# Patient Record
Sex: Female | Born: 1945 | Race: White | Hispanic: No | Marital: Married | Smoking: Never smoker
Health system: Southern US, Community
[De-identification: ages and names within clinical notes are randomized; demographics above are authoritative.]

## PROBLEM LIST (undated history)

## (undated) DIAGNOSIS — I10 Essential (primary) hypertension: Secondary | ICD-10-CM

## (undated) DIAGNOSIS — F329 Major depressive disorder, single episode, unspecified: Secondary | ICD-10-CM

## (undated) DIAGNOSIS — M1990 Unspecified osteoarthritis, unspecified site: Secondary | ICD-10-CM

## (undated) DIAGNOSIS — G709 Myoneural disorder, unspecified: Secondary | ICD-10-CM

## (undated) DIAGNOSIS — M5430 Sciatica, unspecified side: Secondary | ICD-10-CM

## (undated) DIAGNOSIS — IMO0002 Reserved for concepts with insufficient information to code with codable children: Secondary | ICD-10-CM

## (undated) DIAGNOSIS — F32A Depression, unspecified: Secondary | ICD-10-CM

## (undated) DIAGNOSIS — E785 Hyperlipidemia, unspecified: Secondary | ICD-10-CM

## (undated) DIAGNOSIS — M719 Bursopathy, unspecified: Secondary | ICD-10-CM

## (undated) DIAGNOSIS — T7840XA Allergy, unspecified, initial encounter: Secondary | ICD-10-CM

## (undated) HISTORY — DX: Essential (primary) hypertension: I10

## (undated) HISTORY — DX: Depression, unspecified: F32.A

## (undated) HISTORY — DX: Hyperlipidemia, unspecified: E78.5

## (undated) HISTORY — DX: Major depressive disorder, single episode, unspecified: F32.9

## (undated) HISTORY — DX: Unspecified osteoarthritis, unspecified site: M19.90

## (undated) HISTORY — DX: Bursopathy, unspecified: M71.9

## (undated) HISTORY — DX: Reserved for concepts with insufficient information to code with codable children: IMO0002

## (undated) HISTORY — PX: ENDOMETRIAL BIOPSY: SHX622

## (undated) HISTORY — PX: BIOPSY BREAST: PRO8

## (undated) HISTORY — PX: BREAST BIOPSY: SHX20

## (undated) HISTORY — PX: CYSTECTOMY: SUR359

## (undated) HISTORY — DX: Allergy, unspecified, initial encounter: T78.40XA

## (undated) HISTORY — PX: TONSILLECTOMY: SHX5217

## (undated) HISTORY — DX: Sciatica, unspecified side: M54.30

---

## 1992-02-11 HISTORY — PX: LEEP: SHX91

## 1997-09-11 ENCOUNTER — Ambulatory Visit (HOSPITAL_COMMUNITY): Admission: RE | Admit: 1997-09-11 | Discharge: 1997-09-11 | Payer: Self-pay | Admitting: Obstetrics and Gynecology

## 1997-12-20 ENCOUNTER — Other Ambulatory Visit: Admission: RE | Admit: 1997-12-20 | Discharge: 1997-12-20 | Payer: Self-pay | Admitting: Family Medicine

## 1998-03-02 ENCOUNTER — Other Ambulatory Visit: Admission: RE | Admit: 1998-03-02 | Discharge: 1998-03-02 | Payer: Self-pay | Admitting: *Deleted

## 1998-09-14 ENCOUNTER — Ambulatory Visit (HOSPITAL_COMMUNITY): Admission: RE | Admit: 1998-09-14 | Discharge: 1998-09-14 | Payer: Self-pay | Admitting: *Deleted

## 2000-04-07 ENCOUNTER — Other Ambulatory Visit: Admission: RE | Admit: 2000-04-07 | Discharge: 2000-04-07 | Payer: Self-pay | Admitting: Family Medicine

## 2000-04-15 ENCOUNTER — Encounter: Payer: Self-pay | Admitting: Family Medicine

## 2000-04-15 ENCOUNTER — Ambulatory Visit (HOSPITAL_COMMUNITY): Admission: RE | Admit: 2000-04-15 | Discharge: 2000-04-15 | Payer: Self-pay | Admitting: Family Medicine

## 2002-02-23 ENCOUNTER — Encounter: Payer: Self-pay | Admitting: Family Medicine

## 2002-02-23 ENCOUNTER — Ambulatory Visit (HOSPITAL_COMMUNITY): Admission: RE | Admit: 2002-02-23 | Discharge: 2002-02-23 | Payer: Self-pay | Admitting: Family Medicine

## 2002-07-25 ENCOUNTER — Encounter: Admission: RE | Admit: 2002-07-25 | Discharge: 2002-07-25 | Payer: Self-pay | Admitting: Orthopedic Surgery

## 2002-07-25 ENCOUNTER — Encounter: Payer: Self-pay | Admitting: Orthopedic Surgery

## 2002-08-01 ENCOUNTER — Encounter: Admission: RE | Admit: 2002-08-01 | Discharge: 2002-08-01 | Payer: Self-pay | Admitting: Infectious Diseases

## 2002-08-02 ENCOUNTER — Ambulatory Visit (HOSPITAL_BASED_OUTPATIENT_CLINIC_OR_DEPARTMENT_OTHER): Admission: RE | Admit: 2002-08-02 | Discharge: 2002-08-02 | Payer: Self-pay | Admitting: Orthopedic Surgery

## 2002-08-12 ENCOUNTER — Encounter: Admission: RE | Admit: 2002-08-12 | Discharge: 2002-08-12 | Payer: Self-pay | Admitting: Infectious Diseases

## 2002-08-26 ENCOUNTER — Encounter: Admission: RE | Admit: 2002-08-26 | Discharge: 2002-08-26 | Payer: Self-pay | Admitting: Infectious Diseases

## 2002-09-16 ENCOUNTER — Ambulatory Visit (HOSPITAL_COMMUNITY): Admission: RE | Admit: 2002-09-16 | Discharge: 2002-09-16 | Payer: Self-pay | Admitting: Infectious Diseases

## 2002-09-16 ENCOUNTER — Encounter: Payer: Self-pay | Admitting: Infectious Diseases

## 2002-09-23 ENCOUNTER — Encounter: Admission: RE | Admit: 2002-09-23 | Discharge: 2002-09-23 | Payer: Self-pay | Admitting: Infectious Diseases

## 2004-07-09 ENCOUNTER — Ambulatory Visit: Payer: Self-pay | Admitting: Family Medicine

## 2004-07-23 ENCOUNTER — Ambulatory Visit: Payer: Self-pay | Admitting: Family Medicine

## 2004-07-23 ENCOUNTER — Other Ambulatory Visit: Admission: RE | Admit: 2004-07-23 | Discharge: 2004-07-23 | Payer: Self-pay | Admitting: Family Medicine

## 2004-07-24 ENCOUNTER — Ambulatory Visit: Payer: Self-pay | Admitting: Family Medicine

## 2004-07-29 ENCOUNTER — Ambulatory Visit: Payer: Self-pay | Admitting: Internal Medicine

## 2004-08-01 ENCOUNTER — Ambulatory Visit (HOSPITAL_COMMUNITY): Admission: RE | Admit: 2004-08-01 | Discharge: 2004-08-01 | Payer: Self-pay | Admitting: Family Medicine

## 2004-08-31 ENCOUNTER — Ambulatory Visit: Payer: Self-pay | Admitting: Family Medicine

## 2004-11-05 ENCOUNTER — Encounter (INDEPENDENT_AMBULATORY_CARE_PROVIDER_SITE_OTHER): Payer: Self-pay | Admitting: *Deleted

## 2004-11-05 ENCOUNTER — Ambulatory Visit: Payer: Self-pay | Admitting: Family Medicine

## 2004-11-07 ENCOUNTER — Ambulatory Visit: Payer: Self-pay | Admitting: Family Medicine

## 2005-12-15 ENCOUNTER — Ambulatory Visit: Payer: Self-pay | Admitting: Family Medicine

## 2005-12-15 ENCOUNTER — Other Ambulatory Visit: Admission: RE | Admit: 2005-12-15 | Discharge: 2005-12-15 | Payer: Self-pay | Admitting: Family Medicine

## 2005-12-15 ENCOUNTER — Encounter: Payer: Self-pay | Admitting: Family Medicine

## 2005-12-15 LAB — CONVERTED CEMR LAB
AST: 24 units/L (ref 0–37)
Albumin: 4.4 g/dL (ref 3.5–5.2)
Alkaline Phosphatase: 57 units/L (ref 39–117)
BUN: 22 mg/dL (ref 6–23)
Basophils Relative: 0.1 % (ref 0.0–1.0)
Calcium: 9.4 mg/dL (ref 8.4–10.5)
Creatinine, Ser: 1 mg/dL (ref 0.4–1.2)
Eosinophil percent: 0.6 % (ref 0.0–5.0)
Glucose, Bld: 104 mg/dL — ABNORMAL HIGH (ref 70–99)
HCT: 46.7 % — ABNORMAL HIGH (ref 36.0–46.0)
Hgb A1c MFr Bld: 6.1 % — ABNORMAL HIGH (ref 4.6–6.0)
Lymphocytes Relative: 24 % (ref 12.0–46.0)
Neutro Abs: 4.5 10*3/uL (ref 1.4–7.7)
Sodium: 142 meq/L (ref 135–145)
TSH: 3.98 microintl units/mL (ref 0.35–5.50)
Total Bilirubin: 0.8 mg/dL (ref 0.3–1.2)
Total Protein: 7.4 g/dL (ref 6.0–8.3)

## 2005-12-30 ENCOUNTER — Ambulatory Visit: Payer: Self-pay | Admitting: Family Medicine

## 2006-02-17 ENCOUNTER — Ambulatory Visit (HOSPITAL_COMMUNITY): Admission: RE | Admit: 2006-02-17 | Discharge: 2006-02-17 | Payer: Self-pay | Admitting: Family Medicine

## 2006-07-09 ENCOUNTER — Ambulatory Visit: Payer: Self-pay | Admitting: Family Medicine

## 2006-07-10 DIAGNOSIS — M76899 Other specified enthesopathies of unspecified lower limb, excluding foot: Secondary | ICD-10-CM

## 2006-07-10 DIAGNOSIS — IMO0002 Reserved for concepts with insufficient information to code with codable children: Secondary | ICD-10-CM

## 2006-07-10 DIAGNOSIS — F320 Major depressive disorder, single episode, mild: Secondary | ICD-10-CM

## 2006-07-10 DIAGNOSIS — I10 Essential (primary) hypertension: Secondary | ICD-10-CM

## 2006-07-10 DIAGNOSIS — F329 Major depressive disorder, single episode, unspecified: Secondary | ICD-10-CM

## 2006-07-10 HISTORY — DX: Major depressive disorder, single episode, mild: F32.0

## 2006-07-10 HISTORY — DX: Reserved for concepts with insufficient information to code with codable children: IMO0002

## 2006-07-10 HISTORY — DX: Other specified enthesopathies of unspecified lower limb, excluding foot: M76.899

## 2006-07-10 HISTORY — DX: Essential (primary) hypertension: I10

## 2006-07-17 ENCOUNTER — Encounter: Payer: Self-pay | Admitting: Family Medicine

## 2006-07-27 ENCOUNTER — Ambulatory Visit: Payer: Self-pay | Admitting: Family Medicine

## 2006-07-30 ENCOUNTER — Encounter (INDEPENDENT_AMBULATORY_CARE_PROVIDER_SITE_OTHER): Payer: Self-pay | Admitting: *Deleted

## 2006-07-30 LAB — CONVERTED CEMR LAB
ALT: 24 units/L (ref 0–40)
Alkaline Phosphatase: 52 units/L (ref 39–117)
BUN: 24 mg/dL — ABNORMAL HIGH (ref 6–23)
Bilirubin, Direct: 0.1 mg/dL (ref 0.0–0.3)
CO2: 27 meq/L (ref 19–32)
Chloride: 109 meq/L (ref 96–112)
Creatinine, Ser: 0.7 mg/dL (ref 0.4–1.2)
GFR calc non Af Amer: 90 mL/min
Potassium: 4.2 meq/L (ref 3.5–5.1)
Total Bilirubin: 0.5 mg/dL (ref 0.3–1.2)

## 2006-08-03 ENCOUNTER — Encounter: Payer: Self-pay | Admitting: Family Medicine

## 2006-08-05 ENCOUNTER — Telehealth: Payer: Self-pay | Admitting: Family Medicine

## 2006-11-16 ENCOUNTER — Encounter: Payer: Self-pay | Admitting: Family Medicine

## 2006-11-16 ENCOUNTER — Telehealth (INDEPENDENT_AMBULATORY_CARE_PROVIDER_SITE_OTHER): Payer: Self-pay | Admitting: *Deleted

## 2006-11-27 ENCOUNTER — Ambulatory Visit: Payer: Self-pay | Admitting: Family Medicine

## 2006-11-30 ENCOUNTER — Encounter: Payer: Self-pay | Admitting: Family Medicine

## 2006-12-09 ENCOUNTER — Encounter: Payer: Self-pay | Admitting: Family Medicine

## 2006-12-29 ENCOUNTER — Telehealth (INDEPENDENT_AMBULATORY_CARE_PROVIDER_SITE_OTHER): Payer: Self-pay | Admitting: *Deleted

## 2006-12-29 DIAGNOSIS — N951 Menopausal and female climacteric states: Secondary | ICD-10-CM

## 2006-12-29 DIAGNOSIS — I739 Peripheral vascular disease, unspecified: Secondary | ICD-10-CM

## 2006-12-29 HISTORY — DX: Menopausal and female climacteric states: N95.1

## 2006-12-29 HISTORY — DX: Peripheral vascular disease, unspecified: I73.9

## 2007-01-15 ENCOUNTER — Encounter: Payer: Self-pay | Admitting: Family Medicine

## 2007-01-15 ENCOUNTER — Ambulatory Visit: Payer: Self-pay | Admitting: Internal Medicine

## 2007-01-18 ENCOUNTER — Ambulatory Visit: Payer: Self-pay

## 2007-01-20 ENCOUNTER — Encounter: Payer: Self-pay | Admitting: Family Medicine

## 2007-01-21 ENCOUNTER — Encounter: Payer: Self-pay | Admitting: Family Medicine

## 2007-02-15 ENCOUNTER — Encounter (INDEPENDENT_AMBULATORY_CARE_PROVIDER_SITE_OTHER): Payer: Self-pay | Admitting: *Deleted

## 2007-03-23 ENCOUNTER — Ambulatory Visit: Payer: Self-pay | Admitting: Family Medicine

## 2007-03-24 ENCOUNTER — Encounter (INDEPENDENT_AMBULATORY_CARE_PROVIDER_SITE_OTHER): Payer: Self-pay | Admitting: *Deleted

## 2007-04-16 ENCOUNTER — Ambulatory Visit (HOSPITAL_COMMUNITY): Admission: RE | Admit: 2007-04-16 | Discharge: 2007-04-16 | Payer: Self-pay | Admitting: Family Medicine

## 2007-04-26 ENCOUNTER — Encounter (INDEPENDENT_AMBULATORY_CARE_PROVIDER_SITE_OTHER): Payer: Self-pay | Admitting: *Deleted

## 2007-05-18 ENCOUNTER — Telehealth (INDEPENDENT_AMBULATORY_CARE_PROVIDER_SITE_OTHER): Payer: Self-pay | Admitting: *Deleted

## 2007-05-27 ENCOUNTER — Telehealth (INDEPENDENT_AMBULATORY_CARE_PROVIDER_SITE_OTHER): Payer: Self-pay | Admitting: *Deleted

## 2007-06-03 ENCOUNTER — Ambulatory Visit: Payer: Self-pay | Admitting: Family Medicine

## 2007-06-03 DIAGNOSIS — M1990 Unspecified osteoarthritis, unspecified site: Secondary | ICD-10-CM

## 2007-06-03 DIAGNOSIS — E785 Hyperlipidemia, unspecified: Secondary | ICD-10-CM

## 2007-06-03 HISTORY — DX: Unspecified osteoarthritis, unspecified site: M19.90

## 2007-06-03 HISTORY — DX: Hyperlipidemia, unspecified: E78.5

## 2007-08-02 ENCOUNTER — Encounter: Admission: RE | Admit: 2007-08-02 | Discharge: 2007-08-02 | Payer: Self-pay | Admitting: Family Medicine

## 2007-09-23 ENCOUNTER — Telehealth (INDEPENDENT_AMBULATORY_CARE_PROVIDER_SITE_OTHER): Payer: Self-pay | Admitting: *Deleted

## 2007-10-13 ENCOUNTER — Encounter: Payer: Self-pay | Admitting: Family Medicine

## 2007-10-13 ENCOUNTER — Other Ambulatory Visit: Admission: RE | Admit: 2007-10-13 | Discharge: 2007-10-13 | Payer: Self-pay | Admitting: Family Medicine

## 2007-10-13 ENCOUNTER — Ambulatory Visit: Payer: Self-pay | Admitting: Family Medicine

## 2007-10-19 ENCOUNTER — Encounter (INDEPENDENT_AMBULATORY_CARE_PROVIDER_SITE_OTHER): Payer: Self-pay | Admitting: *Deleted

## 2007-10-20 ENCOUNTER — Encounter (INDEPENDENT_AMBULATORY_CARE_PROVIDER_SITE_OTHER): Payer: Self-pay | Admitting: *Deleted

## 2007-10-25 ENCOUNTER — Encounter (INDEPENDENT_AMBULATORY_CARE_PROVIDER_SITE_OTHER): Payer: Self-pay | Admitting: *Deleted

## 2008-05-31 ENCOUNTER — Telehealth: Payer: Self-pay | Admitting: Family Medicine

## 2008-06-02 ENCOUNTER — Telehealth (INDEPENDENT_AMBULATORY_CARE_PROVIDER_SITE_OTHER): Payer: Self-pay | Admitting: *Deleted

## 2008-06-02 ENCOUNTER — Encounter: Payer: Self-pay | Admitting: Family Medicine

## 2008-06-03 ENCOUNTER — Encounter: Payer: Self-pay | Admitting: Family Medicine

## 2008-06-07 LAB — CONVERTED CEMR LAB
ALT: 21 units/L (ref 0–35)
AST: 20 units/L (ref 0–37)
Albumin: 4.4 g/dL (ref 3.5–5.2)
Alkaline Phosphatase: 57 units/L (ref 39–117)
Bilirubin, Direct: 0.1 mg/dL (ref 0.0–0.3)
Indirect Bilirubin: 0.4 mg/dL (ref 0.0–0.9)
Total Bilirubin: 0.5 mg/dL (ref 0.3–1.2)
Total Protein: 7.1 g/dL (ref 6.0–8.3)

## 2008-06-08 ENCOUNTER — Encounter (INDEPENDENT_AMBULATORY_CARE_PROVIDER_SITE_OTHER): Payer: Self-pay | Admitting: *Deleted

## 2008-06-09 ENCOUNTER — Encounter (INDEPENDENT_AMBULATORY_CARE_PROVIDER_SITE_OTHER): Payer: Self-pay | Admitting: *Deleted

## 2008-10-26 ENCOUNTER — Encounter (INDEPENDENT_AMBULATORY_CARE_PROVIDER_SITE_OTHER): Payer: Self-pay | Admitting: *Deleted

## 2008-11-17 ENCOUNTER — Encounter (INDEPENDENT_AMBULATORY_CARE_PROVIDER_SITE_OTHER): Payer: Self-pay | Admitting: *Deleted

## 2008-12-04 ENCOUNTER — Other Ambulatory Visit: Admission: RE | Admit: 2008-12-04 | Discharge: 2008-12-04 | Payer: Self-pay | Admitting: Family Medicine

## 2008-12-04 ENCOUNTER — Ambulatory Visit: Payer: Self-pay | Admitting: Family Medicine

## 2008-12-04 ENCOUNTER — Encounter: Payer: Self-pay | Admitting: Family Medicine

## 2008-12-04 LAB — HM COLONOSCOPY

## 2008-12-18 ENCOUNTER — Encounter (INDEPENDENT_AMBULATORY_CARE_PROVIDER_SITE_OTHER): Payer: Self-pay | Admitting: *Deleted

## 2008-12-22 ENCOUNTER — Encounter: Payer: Self-pay | Admitting: Family Medicine

## 2008-12-25 ENCOUNTER — Encounter: Payer: Self-pay | Admitting: Family Medicine

## 2008-12-25 LAB — CONVERTED CEMR LAB
AST: 21 units/L (ref 0–37)
Albumin: 4.3 g/dL (ref 3.5–5.2)
Alkaline Phosphatase: 45 units/L (ref 39–117)
Basophils Absolute: 0 10*3/uL (ref 0.0–0.1)
Bilirubin Urine: NEGATIVE
Bilirubin, Direct: 0.1 mg/dL (ref 0.0–0.3)
HCT: 45.7 % (ref 36.0–46.0)
Hemoglobin: 15 g/dL (ref 12.0–15.0)
Ketones, ur: NEGATIVE mg/dL
LDL Cholesterol: 62 mg/dL (ref 0–99)
Monocytes Absolute: 0.5 10*3/uL (ref 0.1–1.0)
Monocytes Relative: 6 % (ref 3–12)
Neutro Abs: 5 10*3/uL (ref 1.7–7.7)
Neutrophils Relative %: 62 % (ref 43–77)
Nitrite: NEGATIVE
Platelets: 256 10*3/uL (ref 150–400)
Protein, ur: NEGATIVE mg/dL
RBC / HPF: NONE SEEN (ref <3)
RBC: 4.86 M/uL (ref 3.87–5.11)
Sodium: 144 meq/L (ref 135–145)
Specific Gravity, Urine: 1.016 (ref 1.005–1.030)
TSH: 3.159 microintl units/mL (ref 0.350–4.500)
Total Bilirubin: 0.6 mg/dL (ref 0.3–1.2)
Urine Glucose: NEGATIVE mg/dL
Urobilinogen, UA: 0.2 (ref 0.0–1.0)
VLDL: 14 mg/dL (ref 0–40)
pH: 7.5 (ref 5.0–8.0)

## 2008-12-26 ENCOUNTER — Ambulatory Visit (HOSPITAL_COMMUNITY): Admission: RE | Admit: 2008-12-26 | Discharge: 2008-12-26 | Payer: Self-pay | Admitting: Family Medicine

## 2009-01-08 ENCOUNTER — Encounter: Payer: Self-pay | Admitting: Family Medicine

## 2009-01-11 ENCOUNTER — Encounter: Admission: RE | Admit: 2009-01-11 | Discharge: 2009-01-11 | Payer: Self-pay | Admitting: Family Medicine

## 2009-02-27 ENCOUNTER — Telehealth (INDEPENDENT_AMBULATORY_CARE_PROVIDER_SITE_OTHER): Payer: Self-pay | Admitting: *Deleted

## 2009-02-28 ENCOUNTER — Encounter: Payer: Self-pay | Admitting: Family Medicine

## 2009-02-28 ENCOUNTER — Ambulatory Visit: Payer: Self-pay | Admitting: Family Medicine

## 2009-05-16 ENCOUNTER — Encounter: Payer: Self-pay | Admitting: Family Medicine

## 2009-07-12 ENCOUNTER — Ambulatory Visit: Payer: Self-pay | Admitting: Family Medicine

## 2009-07-17 ENCOUNTER — Encounter: Admission: RE | Admit: 2009-07-17 | Discharge: 2009-07-17 | Payer: Self-pay | Admitting: Family Medicine

## 2009-09-28 ENCOUNTER — Encounter: Payer: Self-pay | Admitting: Family Medicine

## 2009-10-01 LAB — CONVERTED CEMR LAB
CO2: 23 meq/L (ref 19–32)
Calcium: 9.9 mg/dL (ref 8.4–10.5)
Chloride: 108 meq/L (ref 96–112)
Cholesterol: 199 mg/dL (ref 0–200)
Creatinine, Ser: 0.82 mg/dL (ref 0.40–1.20)
Glucose, Bld: 111 mg/dL — ABNORMAL HIGH (ref 70–99)
Potassium: 4.4 meq/L (ref 3.5–5.3)
Sodium: 144 meq/L (ref 135–145)
Total CHOL/HDL Ratio: 2.6
VLDL: 15 mg/dL (ref 0–40)

## 2010-02-17 ENCOUNTER — Encounter: Payer: Self-pay | Admitting: Family Medicine

## 2010-02-19 ENCOUNTER — Encounter
Admission: RE | Admit: 2010-02-19 | Discharge: 2010-02-19 | Payer: Self-pay | Source: Home / Self Care | Attending: Family Medicine | Admitting: Family Medicine

## 2010-02-19 LAB — HM MAMMOGRAPHY

## 2010-02-20 ENCOUNTER — Encounter: Payer: Self-pay | Admitting: Family Medicine

## 2010-02-21 ENCOUNTER — Telehealth (INDEPENDENT_AMBULATORY_CARE_PROVIDER_SITE_OTHER): Payer: Self-pay | Admitting: *Deleted

## 2010-03-10 LAB — CONVERTED CEMR LAB
ALT: 31 units/L (ref 0–35)
AST: 26 units/L (ref 0–37)
Albumin: 4.1 g/dL (ref 3.5–5.2)
Albumin: 4.3 g/dL (ref 3.5–5.2)
Alkaline Phosphatase: 46 units/L (ref 39–117)
Alkaline Phosphatase: 47 units/L (ref 39–117)
Basophils Absolute: 0 10*3/uL (ref 0.0–0.1)
Basophils Relative: 0.4 % (ref 0.0–3.0)
Bilirubin, Direct: 0.1 mg/dL (ref 0.0–0.3)
CO2: 32 meq/L (ref 19–32)
Calcium: 9.6 mg/dL (ref 8.4–10.5)
Creatinine, Ser: 0.8 mg/dL (ref 0.4–1.2)
Eosinophils Relative: 2.5 % (ref 0.0–5.0)
GFR calc Af Amer: 93 mL/min
Glucose, Bld: 101 mg/dL — ABNORMAL HIGH (ref 70–99)
Hemoglobin: 14.2 g/dL (ref 12.0–15.0)
LDL Cholesterol: 58 mg/dL (ref 0–99)
MCV: 95.4 fL (ref 78.0–100.0)
Monocytes Absolute: 0.5 10*3/uL (ref 0.1–1.0)
Monocytes Relative: 6.8 % (ref 3.0–12.0)
Neutro Abs: 3.5 10*3/uL (ref 1.4–7.7)
Platelets: 206 10*3/uL (ref 150–400)
RDW: 12.2 % (ref 11.5–14.6)
Total Bilirubin: 0.8 mg/dL (ref 0.3–1.2)
WBC: 7 10*3/uL (ref 4.5–10.5)

## 2010-03-12 ENCOUNTER — Encounter: Payer: Self-pay | Admitting: Family Medicine

## 2010-03-12 NOTE — Progress Notes (Signed)
Summary: med change  Phone Note Refill Request Message from:  Fax from Pharmacy on [facility] [address] fax [phone]  Refills Requested: Medication #1:  ATACAND HCT 32-12.5 MG  TABS 2 TAB by mouth once daily**OFFICE VISIT DUE NOW** prior authorization [id number]  Initial call taken by: Barb Merino,  February 27, 2009 12:45 PM  Follow-up for Phone Call         ATACAND is not preferred. preferred meds are as follow:Losartan, losartan hct, diovan, diovan HCT, micardis pls advise on med.........Marland KitchenFelecia Deloach CMA  February 27, 2009 4:05 PM   Additional Follow-up for Phone Call Additional follow up Details #1::        diovan 80/12.5  #30 1 by mouth once daily  2 refills---ov 2-3 weeks to check bp Additional Follow-up by: [name] DO,  February 27, 2009 5:00 PM    Additional Follow-up for Phone Call Additional follow up Details #2::    left message to call  office. call in regards to med has been change due to med not being preferred by insurance................Marland KitchenFelecia Deloach CMA  February 28, 2009 8:11 AM   pt aware, rx sent to pharmacy and to schedule OV 2-3 weeks, offer to schedule pt decline will call later............Marland KitchenFelecia Deloach CMA  February 28, 2009 11:23 AM   New/Updated Medications: DIOVAN HCT 80-12.5 MG TABS (VALSARTAN-HYDROCHLOROTHIAZIDE) take 1 by mouth once daily Prescriptions: DIOVAN HCT 80-12.5 MG TABS (VALSARTAN-HYDROCHLOROTHIAZIDE) take 1 by mouth once daily  #30 x 2   Entered by:   Jeremy Johann CMA   Authorized by:   [name] DO   Signed by:   Jeremy Johann CMA on 02/28/2009   Method used:   Electronically to        [facility]  [id number] (retail)       [address]       [place], [place]  [address]       Ph: [phone]       Fax: [phone]   RxID:   [id number]

## 2010-03-12 NOTE — Assessment & Plan Note (Signed)
Summary: bp check/cbs   Vital Signs:  Patient profile:   65 year old female Weight:      170.13 pounds Pulse rate:   76 / minute Pulse rhythm:   regular BP sitting:   114 / 72  (left arm) Cuff size:   regular  Vitals Entered By: Army Fossa CMA (July 12, 2009 12:39 PM) CC: Pt here for BP check   History of Present Illness:  Hypertension follow-up      This is a 65 year old woman who presents for Hypertension follow-up.  pt here for bp check since med was changed.   No complaints.  The patient denies lightheadedness, urinary frequency, headaches, edema, impotence, rash, and fatigue.  The patient denies the following associated symptoms: chest pain, chest pressure, exercise intolerance, dyspnea, palpitations, syncope, leg edema, and pedal edema.  Compliance with medications (by patient report) has been near 100%.  The patient reports that dietary compliance has been good.  Adjunctive measures currently used by the patient include salt restriction.    Allergies: No Known Drug Allergies  Physical Exam  General:  Well-developed,well-nourished,in no acute distress; alert,appropriate and cooperative throughout examination Lungs:  Normal respiratory effort, chest expands symmetrically. Lungs are clear to auscultation, no crackles or wheezes. Heart:  normal rate and no murmur.   Extremities:  No clubbing, cyanosis, edema, or deformity noted with normal full range of motion of all joints.   Psych:  Oriented X3 and normally interactive.     Impression & Recommendations:  Problem # 1:  HYPERTENSION (ICD-401.9)  Her updated medication list for this problem includes:    Diovan Hct 80-12.5 Mg Tabs (Valsartan-hydrochlorothiazide) .[name]... Take 1 by mouth once daily  Orders: Venipuncture (40814) TLB-Lipid Panel (80061-LIPID) TLB-BMP (Basic Metabolic Panel-BMET) (80048-METABOL) TLB-Hepatic/Liver Function Pnl (80076-HEPATIC)  BP today: 114/72 Prior BP: 110/64 (12/04/2008)  Labs  Reviewed: K+: 4.8 (12/22/2008) Creat: : 0.82 (12/22/2008)   Chol: 134 (12/22/2008)   HDL: 58 (12/22/2008)   LDL: 62 (12/22/2008)   TG: 72 (12/22/2008)  Complete Medication List: 1)  Vytorin 10-40 Mg Tabs (Ezetimibe-simvastatin) .[name].. 1 by mouth once daily 2)  Zoloft 100 Mg Tabs (Sertraline hcl) .[name].. 1 and 1/2 tab by mouth once daily**office visit due now** 3)  Diovan Hct 80-12.5 Mg Tabs (Valsartan-hydrochlorothiazide) .... Take 1 by mouth once daily 4)  Niaspan 1000 Mg Tbcr (Niacin (antihyperlipidemic)) .... # 2, 1 by mouth at bedtime 5)  Fluticasone Propionate 50 Mcg/act Susp (Fluticasone propionate) .... Use 2 sprays each nostril daily 6)  Mobic 15 Mg Tabs (Meloxicam) .... 1/2 - 1 by mouth once daily prn 7)  Lipid, Hep  .... Dx 272.4 8)  Finasteride 5 Mg Tabs (Finasteride) .... 1/2 tab daily for hair loss.  Patient Instructions: 1)  Please schedule a follow-up appointment in 6 months .  Prescriptions: DIOVAN HCT 80-12.5 MG TABS (VALSARTAN-HYDROCHLOROTHIAZIDE) take 1 by mouth once daily  #30 x 5   Entered and Authorized by:   [name] DO   Signed by:   [name] DO on 07/12/2009   Method used:   Electronically to        [facility]  [id number]* (retail)       [address]       [place], [place]  [address]       Ph: [phone]       Fax: [phone]   RxID:   [id number]

## 2010-03-12 NOTE — Letter (Signed)
Summary: [facility]  [facility]   Imported By: Lanelle Bal 06/13/2009 08:41:31  _____________________________________________________________________  External Attachment:    Type:   Image     Comment:   External Document

## 2010-03-12 NOTE — Miscellaneous (Signed)
Summary: BONE DENSITY  Clinical Lists Changes  Orders: Added new Test order of T-Bone Densitometry (77080) - Signed Added new Test order of T-Lumbar Vertebral Assessment (77082) - Signed 

## 2010-03-13 ENCOUNTER — Other Ambulatory Visit: Payer: Self-pay | Admitting: Family Medicine

## 2010-03-13 ENCOUNTER — Ambulatory Visit (INDEPENDENT_AMBULATORY_CARE_PROVIDER_SITE_OTHER): Payer: 59 | Admitting: Family Medicine

## 2010-03-13 ENCOUNTER — Ambulatory Visit: Admit: 2010-03-13 | Payer: Self-pay | Admitting: Family Medicine

## 2010-03-13 ENCOUNTER — Encounter (INDEPENDENT_AMBULATORY_CARE_PROVIDER_SITE_OTHER): Payer: Self-pay | Admitting: *Deleted

## 2010-03-13 ENCOUNTER — Encounter: Payer: Self-pay | Admitting: Family Medicine

## 2010-03-13 DIAGNOSIS — Z23 Encounter for immunization: Secondary | ICD-10-CM

## 2010-03-13 DIAGNOSIS — E785 Hyperlipidemia, unspecified: Secondary | ICD-10-CM

## 2010-03-13 DIAGNOSIS — Z860100 Personal history of colon polyps, unspecified: Secondary | ICD-10-CM

## 2010-03-13 DIAGNOSIS — I10 Essential (primary) hypertension: Secondary | ICD-10-CM

## 2010-03-13 DIAGNOSIS — N951 Menopausal and female climacteric states: Secondary | ICD-10-CM

## 2010-03-13 DIAGNOSIS — Z0000 Encounter for general adult medical examination without abnormal findings: Secondary | ICD-10-CM

## 2010-03-13 HISTORY — DX: Personal history of colon polyps, unspecified: Z86.0100

## 2010-03-13 HISTORY — DX: Personal history of colonic polyps: Z86.010

## 2010-03-13 LAB — HEPATIC FUNCTION PANEL
ALT: 41 U/L — ABNORMAL HIGH (ref 0–35)
AST: 33 U/L (ref 0–37)
Alkaline Phosphatase: 62 U/L (ref 39–117)
Total Bilirubin: 0.4 mg/dL (ref 0.3–1.2)
Total Protein: 7.1 g/dL (ref 6.0–8.3)

## 2010-03-13 LAB — BASIC METABOLIC PANEL
CO2: 29 mEq/L (ref 19–32)
Calcium: 9.5 mg/dL (ref 8.4–10.5)
Creatinine, Ser: 0.7 mg/dL (ref 0.4–1.2)

## 2010-03-14 NOTE — Letter (Signed)
Summary: Primary Care Appointment Letter  [facility]  [address] [place], [place] [address]   Phone: [phone]  Fax: [phone]    02/20/2010 MRN: [id number]    Glenda Lyons [address] [place], [place]  [address]    Dear [name],     Your Primary Care Physician [name] DO has indicated that:    ___X____it is time to schedule an appointment for an Office Visit with fasting labs.    _______you missed your appointment on______ and need to call and          reschedule.    _______you need to have lab work done.    _______you need to schedule an appointment discuss lab or test results.    _______you need to call to reschedule your appointment that is                       scheduled on _________.     Please call our office as soon as possible. Our phone number is [phone]          _________. Please press option 1. Our office is open 8a-12noon and 1p-5p, Monday through Friday.     Thank you,    [facility]

## 2010-03-14 NOTE — Progress Notes (Signed)
Summary: Patient needs and OV  Phone Note Outgoing Call   Call placed by: Almeta Monas CMA Duncan Dull),  February 21, 2010 11:11 AM Call placed to: Patient Details for Reason: Patient wants labs but she needs to have an OV per Dr. Laury Axon Summary of Call: called patient home # Left message to call back... Almeta Monas CMA Duncan Dull)  February 21, 2010 11:12 AM'    Follow-up for Phone Call        Spoke with patient and scheduled appointment for 03/13/2010 (Wed) at 10. Per patient needs a 10am or 11am appointment Follow-up by: Shonna Chock CMA,  February 21, 2010 4:12 PM

## 2010-03-14 NOTE — Letter (Signed)
Summary: Letter from Patient Regarding Lab Order  Letter from Patient Regarding Lab Order   Imported By: Lanelle Bal 03/05/2010 13:05:51  _____________________________________________________________________  External Attachment:    Type:   Image     Comment:   External Document

## 2010-03-20 NOTE — Assessment & Plan Note (Signed)
Summary: F/U   Vital Signs:  Patient profile:   65 year old female Height:      65 inches Weight:      173.4 pounds BMI:     28.96 Pulse rate:   76 / minute Pulse rhythm:   regular BP sitting:   122 / 82  (left arm) Cuff size:   large  Vitals Entered By: Almeta Monas CMA Duncan Dull) (March 13, 2010 10:03 AM) CC: Med F/U---Pt Fasting--needs labs(recheck 3 months---bmp, hgba1c  790.6  272.4  hep, lipid) They were due in Nov   History of Present Illness:  Hypertension follow-up      This is a 65 year old woman who presents for Hypertension follow-up.  The patient denies lightheadedness, urinary frequency, headaches, edema, impotence, rash, and fatigue.  The patient denies the following associated symptoms: chest pain, chest pressure, exercise intolerance, dyspnea, palpitations, syncope, leg edema, and pedal edema.  Compliance with medications (by patient report) has been near 100%.  The patient reports that dietary compliance has been good.  The patient reports exercising 3-4X per week.  Adjunctive measures currently used by the patient include salt restriction.    Hyperlipidemia follow-up      The patient also presents for Hyperlipidemia follow-up.  The patient denies muscle aches, GI upset, abdominal pain, flushing, itching, constipation, diarrhea, and fatigue.  The patient denies the following symptoms: chest pain/pressure, exercise intolerance, dypsnea, palpitations, syncope, and pedal edema.  Compliance with medications (by patient report) has been near 100%.  Dietary compliance has been good.  The patient reports exercising 3-4X per week.  Adjunctive measures currently used by the patient include ASA, fish oil supplements, and weight reduction.    Current Medications (verified): 1)  Vytorin 10-40 Mg  Tabs (Ezetimibe-Simvastatin) .[name].. 1 By Mouth Once Daily 2)  Zoloft 100 Mg  Tabs (Sertraline Hcl) .[name].. 1 and 1/2 Tab By Mouth Once Daily**office Visit Due Now** 3)  Diovan Hct 80-12.5 Mg  Tabs (Valsartan-Hydrochlorothiazide) .... Take 1 By Mouth Once Daily 4)  Niaspan 1000 Mg  Tbcr (Niacin (Antihyperlipidemic)) .[name].. 1 By Mouth At Bedtime 5)  Fluticasone Propionate 50 Mcg/act  Susp (Fluticasone Propionate) .... Use 2 Sprays Each Nostril Daily 6)  Mobic 15 Mg  Tabs (Meloxicam) .... 1/2 - 1 By Mouth Once Daily Prn 7)  Lipid, Hep .... Dx 272.4 8)  Finasteride 5 Mg Tabs (Finasteride) .... 1/2 Tab Daily For Hair Loss.  Allergies (verified): No Known Drug Allergies  Past History:  Past medical, surgical, family and social histories (including risk factors) reviewed for relevance to current acute and chronic problems.  Past Medical History: Reviewed history from 10/13/2007 and no changes required. 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 Tonsillectomy  Family History: Reviewed history from 07/10/2006 and no changes required. Family History Breast Cancer Family History MI Family History lymphoma  Social History: Reviewed history from 10/13/2007 and no changes required. Married Never Smoked Alcohol use-yes Drug use-no Regular exercise-yes  Review of Systems      See HPI  Physical Exam  General:  Well-developed,well-nourished,in no acute distress; alert,appropriate and cooperative throughout examination Neck:  No deformities, masses, or tenderness noted. Lungs:  Normal respiratory effort, chest expands symmetrically. Lungs are clear to auscultation, no crackles or wheezes. Heart:  normal rate and  no murmur.   Extremities:  No clubbing, cyanosis, edema, or deformity noted with normal full range of  motion of all joints.   Cervical Nodes:  No lymphadenopathy noted Psych:  Cognition and judgment appear intact. Alert and cooperative with normal attention span and concentration. No apparent delusions, illusions, hallucinations   Impression & Recommendations:  Problem # 1:  HYPERLIPIDEMIA (ICD-272.4)  Her updated medication list for this problem includes:    Vytorin 10-40 Mg Tabs (Ezetimibe-simvastatin) .[name]... 1 by mouth once daily    Niaspan 1000 Mg Tbcr (Niacin (antihyperlipidemic)) .[name]... 1 by mouth at bedtime  Orders: Venipuncture (81191) TLB-Lipid Panel (80061-LIPID) TLB-BMP (Basic Metabolic Panel-BMET) (80048-METABOL) TLB-Hepatic/Liver Function Pnl (80076-HEPATIC)  Labs Reviewed: SGOT: 24 (09/28/2009)   SGPT: 26 (09/28/2009)   HDL:78 (09/28/2009), 58 (12/22/2008)  LDL:106 (09/28/2009), 62 (47/82/9562)  Chol:199 (09/28/2009), 134 (12/22/2008)  Trig:75 (09/28/2009), 72 (12/22/2008)  Problem # 2:  COLONIC POLYPS, HX OF (ICD-V12.72)  Orders: Gastroenterology Referral (GI)  Problem # 3:  HYPERTENSION (ICD-401.9)  Her updated medication list for this problem includes:    Diovan Hct 80-12.5 Mg Tabs (Valsartan-hydrochlorothiazide) .[name]... Take 1 by mouth once daily  Orders: Venipuncture (13086) TLB-Lipid Panel (80061-LIPID) TLB-BMP (Basic Metabolic Panel-BMET) (80048-METABOL) TLB-Hepatic/Liver Function Pnl (80076-HEPATIC)  BP today: 122/82 Prior BP: 114/72 (07/12/2009)  Labs Reviewed: K+: 4.4 (09/28/2009) Creat: : 0.82 (09/28/2009)   Chol: 199 (09/28/2009)   HDL: 78 (09/28/2009)   LDL: 106 (09/28/2009)   TG: 75 (09/28/2009)  Complete Medication List: 1)  Vytorin 10-40 Mg Tabs (Ezetimibe-simvastatin) .[name].. 1 by mouth once daily 2)  Zoloft 100 Mg Tabs (Sertraline hcl) .[name].. 1 and 1/2 tab by mouth once daily**office visit due now** 3)  Diovan Hct 80-12.5 Mg Tabs (Valsartan-hydrochlorothiazide) .... Take 1 by mouth once daily 4)  Niaspan 1000 Mg Tbcr (Niacin  (antihyperlipidemic)) .[name].. 1 by mouth at bedtime 5)  Fluticasone Propionate 50 Mcg/act Susp (Fluticasone propionate) .... Use 2 sprays each nostril daily 6)  Mobic 15 Mg Tabs (Meloxicam) .... 1/2 - 1 by mouth once daily prn 7)  Lipid, Hep  .... Dx 272.4 8)  Finasteride 5 Mg Tabs (Finasteride) .... 1/2 tab daily for hair loss.  Other Orders: Tdap => 20yrs IM (57846) Admin 1st Vaccine (96295)  Patient Instructions: 1)  Rto 6 months for cpe and labs Prescriptions: DIOVAN HCT 80-12.5 MG TABS (VALSARTAN-HYDROCHLOROTHIAZIDE) take 1 by mouth once daily  #30 x 5   Entered and Authorized by:   [name] DO   Signed by:   [name] DO on 03/13/2010   Method used:   Electronically to        [facility]  [id number] (retail)       [address]       [place], [place]  [address]       Ph: [phone]       Fax: [phone]   RxID:   [id number]    Orders Added: 1)  Tdap => 27yrs IM [90715] 2)  Admin 1st Vaccine [90471] 3)  Venipuncture [18841] 4)  TLB-Lipid Panel [80061-LIPID] 5)  TLB-BMP (Basic Metabolic Panel-BMET) [80048-METABOL] 6)  TLB-Hepatic/Liver Function Pnl [80076-HEPATIC] 7)  Gastroenterology Referral [GI] 8)  Est. Patient Level IV [66063]   Immunizations Administered:  Tetanus Vaccine:    Vaccine Type: Tdap    Site: left deltoid    Mfr: Merck    Dose: 0.5 ml    Route: IM    Given by: Almeta Monas CMA (AAMA)    Exp. Date:  11/30/2011    Lot #: [id number]    VIS given: 12/29/07 version given March 13, 2010.   Immunizations Administered:  Tetanus Vaccine:    Vaccine Type: Tdap    Site: left deltoid    Mfr: Merck    Dose: 0.5 ml    Route: IM    Given by: Almeta Monas CMA (AAMA)    Exp. Date: 11/30/2011    Lot #: [id number]    VIS given: 12/29/07 version given March 13, 2010.   Last Mammogram:  ASSESSMENT: Negative - BI-RADS 1^MM DIGITAL SCREENING (02/19/2010 2:05:00 PM) Mammogram Result Date:  02/19/2010 Mammogram Result:  normal Mammogram  Next Due:  1 yr  Appended Document: F/U   Appended Document: Immunization Entry      Immunizations Administered:  Pneumonia Vaccine:    Vaccine Type: Pneumovax (Medicare)    Site: right deltoid    Mfr: Merck    Dose: 0.5 ml    Route: IM    Given by: Almeta Monas CMA (AAMA)    Exp. Date: 06/12/2011    Lot #: [id number]    VIS given: 01/15/09 version given March 13, 2010.

## 2010-03-20 NOTE — Letter (Signed)
Summary: Pre Visit Letter Revised  [facility]  [address] [place], [place] [address]   Phone: [phone]  Fax: [phone]        03/13/2010 MRN: [id number] Glenda Lyons [address] [place], [place]  [address]             Procedure Date: April 15, 2010   dir col-Dr Tomasita Morrow to the Gastroenterology Division at [facility].    You are scheduled to see a nurse for your pre-procedure visit on April 01, 2010 at 10:00am on the 3rd floor at [facility], [address].  We ask that you try to arrive at our office 15 minutes prior to your appointment time to allow for check-in.  Please take a minute to review the attached form.  If you answer "Yes" to one or more of the questions on the first page, we ask that you call the person listed at your earliest opportunity.  If you answer "No" to all of the questions, please complete the rest of the form and bring it to your appointment.    Your nurse visit will consist of discussing your medical and surgical history, your immediate family medical history, and your medications.   If you are unable to list all of your medications on the form, please bring the medication bottles to your appointment and we will list them.  We will need to be aware of both prescribed and over the counter drugs.  We will need to know exact dosage information as well.    Please be prepared to read and sign documents such as consent forms, a financial agreement, and acknowledgement forms.  If necessary, and with your consent, a friend or relative is welcome to sit-in on the nurse visit with you.  Please bring your insurance card so that we may make a copy of it.  If your insurance requires a referral to see a specialist, please bring your referral form from your primary care physician.  No co-pay is required for this nurse visit.     If you cannot keep your appointment, please call [phone] to cancel or reschedule  prior to your appointment date.  This allows [place] the opportunity to schedule an appointment for another patient in need of care.    Thank you for choosing [facility] for your medical needs.  We appreciate the opportunity to care for you.  Please visit [place] at our website  to learn more about our practice.  Sincerely, The Gastroenterology Division

## 2010-03-23 ENCOUNTER — Encounter: Payer: Self-pay | Admitting: Family Medicine

## 2010-04-15 ENCOUNTER — Other Ambulatory Visit: Payer: 59 | Admitting: Gastroenterology

## 2010-06-12 ENCOUNTER — Ambulatory Visit: Payer: 59 | Admitting: Family Medicine

## 2010-06-14 ENCOUNTER — Ambulatory Visit (INDEPENDENT_AMBULATORY_CARE_PROVIDER_SITE_OTHER): Payer: 59 | Admitting: Family Medicine

## 2010-06-14 ENCOUNTER — Encounter: Payer: Self-pay | Admitting: Family Medicine

## 2010-06-14 VITALS — BP 140/70 | HR 76 | Temp 98.4°F | Wt 175.0 lb

## 2010-06-14 DIAGNOSIS — N898 Other specified noninflammatory disorders of vagina: Secondary | ICD-10-CM

## 2010-06-14 DIAGNOSIS — R319 Hematuria, unspecified: Secondary | ICD-10-CM

## 2010-06-14 DIAGNOSIS — R300 Dysuria: Secondary | ICD-10-CM

## 2010-06-14 DIAGNOSIS — R350 Frequency of micturition: Secondary | ICD-10-CM

## 2010-06-14 HISTORY — DX: Dysuria: R30.0

## 2010-06-14 LAB — POCT URINALYSIS DIPSTICK
Glucose, UA: NEGATIVE
Ketones, UA: NEGATIVE
Leukocytes, UA: NEGATIVE
Spec Grav, UA: 1.025

## 2010-06-14 MED ORDER — CIPROFLOXACIN HCL 500 MG PO TABS: 500.0000 mg | ORAL_TABLET | Freq: Two times a day (BID) | ORAL | Status: AC

## 2010-06-14 NOTE — Assessment & Plan Note (Signed)
Check culture Check wet mount If all cultures neg ---refer to gyn for vag bleed

## 2010-06-14 NOTE — Patient Instructions (Signed)
If symptoms worsen fill rx for antibiotics

## 2010-06-14 NOTE — Progress Notes (Signed)
  Subjective:    Patient ID: Glenda Lyons, female    DOB: 10-26-1945, 65 y.o.   MRN: [id number]  HPI Pt here c/o bloody vaginal d/c and urinary frequency for several days.  Pt states she was sitting at the computer and felt suprapubic pressure/cramping that lasted a few minutes.  No fevers and it has not occurred since.    Review of Systems as above   Objective:   Physical Exam  Constitutional: She appears well-developed and well-nourished.  Abdominal: Soft. Normal appearance and bowel sounds are normal. She exhibits no distension and no mass. There is no tenderness. There is no rigidity, no rebound and no guarding.  Genitourinary: Rectum normal. Rectal exam shows no external hemorrhoid, no internal hemorrhoid, no fissure, no mass, no tenderness and anal tone normal. Guaiac negative stool. Pelvic exam was performed with patient supine. There is no rash, tenderness or lesion on the right labia. There is no rash, tenderness or lesion on the left labia. Cervix exhibits discharge. Cervix exhibits no motion tenderness and no friability. There is erythema and bleeding around the vagina. No tenderness around the vagina. No foreign body around the vagina. No signs of injury around the vagina. Vaginal discharge found.  Psychiatric: She has a normal mood and affect. Her behavior is normal. Judgment normal.          Assessment & Plan:

## 2010-06-15 LAB — GC/CHLAMYDIA PROBE AMP, GENITAL
Chlamydia, DNA Probe: NEGATIVE
GC Probe Amp, Genital: NEGATIVE

## 2010-06-15 LAB — WET PREP BY MOLECULAR PROBE: Gardnerella vaginalis: NEGATIVE

## 2010-06-18 ENCOUNTER — Telehealth: Payer: Self-pay | Admitting: *Deleted

## 2010-06-18 LAB — URINE CULTURE: Colony Count: >=100000

## 2010-06-18 MED ORDER — SIMVASTATIN 40 MG PO TABS: 40.0000 mg | ORAL_TABLET | Freq: Every evening | ORAL | Status: DC

## 2010-06-18 MED ORDER — EZETIMIBE 10 MG PO TABS: 10.0000 mg | ORAL_TABLET | Freq: Every day | ORAL | Status: DC

## 2010-06-18 NOTE — Telephone Encounter (Signed)
Pt aware of medication changes and aware of labs results     KP

## 2010-06-18 NOTE — Telephone Encounter (Signed)
Will it be Zocor 40 and  Zetia 10 ?    KP

## 2010-06-18 NOTE — Telephone Encounter (Signed)
vytorin  Is zocor and zetia together---ok to call them in separate if that is what ins prefers

## 2010-06-18 NOTE — Telephone Encounter (Signed)
Vytorin is a non-preferred med. Coverage alternative:Simvastatin, Zetia. Please advise if alternative med would work for Pt, if not PA will need to be done.  Case  ID#: [id number]

## 2010-06-18 NOTE — Telephone Encounter (Signed)
yes

## 2010-06-19 ENCOUNTER — Telehealth: Payer: Self-pay

## 2010-06-19 NOTE — Telephone Encounter (Signed)
Left message to call back     KP 

## 2010-06-19 NOTE — Telephone Encounter (Signed)
Message copied by Almeta Monas on Wed Jun 19, 2010 12:17 PM ------      Message from: [name]      Created: Tue Jun 18, 2010 10:46 PM       + UTI---on Glenda Lyons

## 2010-06-25 NOTE — Telephone Encounter (Signed)
Message left to contact the office      KP

## 2010-06-26 ENCOUNTER — Other Ambulatory Visit: Payer: Self-pay | Admitting: Family Medicine

## 2010-06-26 NOTE — Telephone Encounter (Signed)
Last seen 06/14/10 and filled 12/19/09 #45 with 4 refills...please advise     KP

## 2010-06-26 NOTE — Telephone Encounter (Signed)
Rx sent to pharmacy   

## 2010-06-26 NOTE — Telephone Encounter (Signed)
Pt has not returned calls---She was already treated      KP

## 2010-06-28 NOTE — Op Note (Signed)
NAME:  Glenda Lyons, Glenda Lyons                         ACCOUNT NO.:  [id number]   MEDICAL RECORD NO.:  [id number]                   PATIENT TYPE:  AMB   LOCATION:  DSC                                  FACILITY:  [facility]   PHYSICIAN:  Robert A. Thurston Hole, M.D.              DATE OF BIRTH:  1945/11/07   DATE OF PROCEDURE:  08/02/2002  DATE OF DISCHARGE:                                 OPERATIVE REPORT   PREOPERATIVE DIAGNOSIS:  Left foot third metatarsal osteomyelitis.   POSTOPERATIVE DIAGNOSIS:  Left foot third metatarsal osteomyelitis.   PROCEDURE:  Left foot third metatarsal irrigation and debridement.   SURGEON:  Dr. Elana Alm. Wainer.   ASSISTANT:  Kirstin Tomasa Rand, P.A.   ANESTHESIA:  Ankle block.   OPERATIVE TIME:  40 minutes.   COMPLICATIONS:  None.   INDICATIONS FOR PROCEDURE:  [name] is a 65 year old woman who has had  significant left foot pain for the past two months secondary to being stuck  with a rose bush thorn.  Initial exam and x-rays were negative, but  subsequent MRI and blood work have shown signs and symptoms consistent with  a possible osteomyelitis of the third metatarsal.  She is now to undergo  primary irrigation and debridement and cultures of this.   DESCRIPTION:  [name] was brought to the operating room on August 02, 2002,  placed on the operative table in the supine position.  After an adequate  level of ankle block anesthesia had been placed in the holding room, her  left foot and leg were prepped and draped using sterile technique.  The left  foot and ankle were exsanguinated and a calf tourniquet elevated to 300 mm.  Initially, using fluoroscopic control, a 3 cm longitudinal incision was made  over the base of the third metatarsal.  The underlying subcutaneous tissues  were incised along the skin incision.  The neurovascular bundle carefully  retracted while the dorsal aspect of the third metatarsal base was exposed,  and then small drill holes  placed in this and then an osteotome used to  remove a window in the top of the bone.  No obvious purulence was noted, but  there was fluid seen in the intramedullary cavity, and cultures were  obtained of this as well as bone samples from the medullary canal sent for  all cultures, Gram stains, and fungal and AFB cultures and regular cultures  as well.  After this was thoroughly debrided, then it was irrigated with  antibiotic solution and saline.  At this point no further pathology was  noted.  The periosteum was closed loosely with 3-0 Vicryl suture.  The  tourniquet was released.  Small bleeders were cauterized.  The skin was then  closed with interrupted 4-0 nylon suture, sterile dressings and a short-leg  splint were applied, and then the patient was awakened and taken to the  recovery room in stable  condition.    FOLLOW-UP CARE:  Ms. Marteney will be followed as an outpatient, on Avelox  antibiotic as well as Percocet for pain.  I will see her back in the office  in a week for wound check and follow-up.                                               Robert A. Thurston Hole, M.D.    RAW/MEDQ  D:  08/02/2002  T:  08/02/2002  Job:  [id number]

## 2010-07-03 ENCOUNTER — Telehealth: Payer: Self-pay

## 2010-07-03 MED ORDER — ZOSTER VACCINE LIVE 19400 UNT/0.65ML ~~LOC~~ SOLR: 0.6500 mL | Freq: Once | SUBCUTANEOUS | Status: AC

## 2010-07-03 NOTE — Telephone Encounter (Signed)
Pt called would like to have rx for shingles vaccine she already discussed w/ Dr. Laury Axon and would like it faxed to [facility].

## 2010-07-25 ENCOUNTER — Other Ambulatory Visit: Payer: Self-pay | Admitting: Family Medicine

## 2010-07-25 NOTE — Telephone Encounter (Signed)
Last seen 06/14/10 and filled 07/16/09 with 5 refills. Please advise     KP

## 2010-08-05 ENCOUNTER — Other Ambulatory Visit: Payer: Self-pay | Admitting: Family Medicine

## 2010-08-05 DIAGNOSIS — N950 Postmenopausal bleeding: Secondary | ICD-10-CM

## 2010-08-27 ENCOUNTER — Ambulatory Visit: Payer: 59 | Admitting: Family Medicine

## 2010-09-03 ENCOUNTER — Other Ambulatory Visit: Payer: Self-pay | Admitting: Obstetrics and Gynecology

## 2010-09-04 ENCOUNTER — Encounter (HOSPITAL_COMMUNITY): Payer: Self-pay

## 2010-09-04 ENCOUNTER — Other Ambulatory Visit: Payer: Self-pay

## 2010-09-04 ENCOUNTER — Encounter (HOSPITAL_COMMUNITY)
Admission: RE | Admit: 2010-09-04 | Discharge: 2010-09-04 | Disposition: A | Discharge status: Home / Self Care | Payer: 59 | Source: Ambulatory Visit | Attending: Obstetrics and Gynecology | Admitting: Obstetrics and Gynecology

## 2010-09-04 HISTORY — DX: Myoneural disorder, unspecified: G70.9

## 2010-09-04 LAB — CBC
Hemoglobin: 14.2 g/dL (ref 12.0–15.0)
MCH: 31.1 pg (ref 26.0–34.0)
MCHC: 34 g/dL (ref 30.0–36.0)

## 2010-09-04 NOTE — Patient Instructions (Addendum)
20 Glenda Lyons  09/04/2010   Your procedure is scheduled on:  09/11/10  Report to [facility] at 8 AM.  Call this number if you have problems the morning of surgery: [phone]   Remember:   Do not eat food:After Midnight.  Do not drink clear liquids: After Midnight.  Take these medicines the morning of surgery with A SIP OF WATER: Zoloft 150mg    Do not wear jewelry, make-up or nail polish.  Do not bring valuables to the hospital.  Contacts, dentures or bridgework may not be worn into surgery.  Leave suitcase in the car. After surgery it may be brought to your room.  For patients admitted to the hospital, checkout time is 11:00 AM the day of discharge.   Patients discharged the day of surgery will not be allowed to drive home.  Name and phone number of your driver: husband   Feeser, Robt  Special Instructions:CHG bath: 1/2 bottle PM before surgery and 1/2 bottle AM of surgery.  Please read over the following fact sheets that you were given: CHG (Hibiclens)

## 2010-09-09 ENCOUNTER — Other Ambulatory Visit: Payer: Self-pay | Admitting: Obstetrics and Gynecology

## 2010-09-11 ENCOUNTER — Encounter (HOSPITAL_COMMUNITY)
Admission: RE | Disposition: A | Discharge status: Home / Self Care | Payer: Self-pay | Source: Ambulatory Visit | Attending: Obstetrics and Gynecology

## 2010-09-11 ENCOUNTER — Encounter (HOSPITAL_COMMUNITY): Payer: Self-pay | Admitting: Anesthesiology

## 2010-09-11 ENCOUNTER — Ambulatory Visit (HOSPITAL_COMMUNITY)
Admission: RE | Admit: 2010-09-11 | Discharge: 2010-09-11 | Disposition: A | Discharge status: Home / Self Care | Payer: 59 | Source: Ambulatory Visit | Attending: Obstetrics and Gynecology | Admitting: Obstetrics and Gynecology

## 2010-09-11 ENCOUNTER — Ambulatory Visit (HOSPITAL_COMMUNITY): Payer: 59 | Admitting: Anesthesiology

## 2010-09-11 ENCOUNTER — Other Ambulatory Visit: Payer: Self-pay | Admitting: Obstetrics and Gynecology

## 2010-09-11 DIAGNOSIS — F3289 Other specified depressive episodes: Secondary | ICD-10-CM

## 2010-09-11 DIAGNOSIS — N840 Polyp of corpus uteri: Secondary | ICD-10-CM | POA: Insufficient documentation

## 2010-09-11 DIAGNOSIS — Z01812 Encounter for preprocedural laboratory examination: Secondary | ICD-10-CM | POA: Insufficient documentation

## 2010-09-11 DIAGNOSIS — E785 Hyperlipidemia, unspecified: Secondary | ICD-10-CM

## 2010-09-11 DIAGNOSIS — IMO0002 Reserved for concepts with insufficient information to code with codable children: Secondary | ICD-10-CM

## 2010-09-11 DIAGNOSIS — Z01818 Encounter for other preprocedural examination: Secondary | ICD-10-CM | POA: Insufficient documentation

## 2010-09-11 DIAGNOSIS — R300 Dysuria: Secondary | ICD-10-CM

## 2010-09-11 DIAGNOSIS — M1990 Unspecified osteoarthritis, unspecified site: Secondary | ICD-10-CM

## 2010-09-11 DIAGNOSIS — Z860100 Personal history of colon polyps, unspecified: Secondary | ICD-10-CM

## 2010-09-11 DIAGNOSIS — N951 Menopausal and female climacteric states: Secondary | ICD-10-CM

## 2010-09-11 DIAGNOSIS — I10 Essential (primary) hypertension: Secondary | ICD-10-CM

## 2010-09-11 DIAGNOSIS — N950 Postmenopausal bleeding: Secondary | ICD-10-CM | POA: Insufficient documentation

## 2010-09-11 DIAGNOSIS — M76899 Other specified enthesopathies of unspecified lower limb, excluding foot: Secondary | ICD-10-CM

## 2010-09-11 DIAGNOSIS — I739 Peripheral vascular disease, unspecified: Secondary | ICD-10-CM

## 2010-09-11 DIAGNOSIS — F329 Major depressive disorder, single episode, unspecified: Secondary | ICD-10-CM

## 2010-09-11 HISTORY — PX: DILATION AND CURETTAGE OF UTERUS: SHX78

## 2010-09-11 HISTORY — PX: HYSTEROSCOPY WITH RESECTOSCOPE: SHX5395

## 2010-09-11 LAB — BASIC METABOLIC PANEL
BUN: 20 mg/dL (ref 6–23)
Calcium: 10.2 mg/dL (ref 8.4–10.5)
Creatinine, Ser: 0.67 mg/dL (ref 0.50–1.10)
GFR calc non Af Amer: >60 mL/min (ref >60)
Glucose, Bld: 116 mg/dL — ABNORMAL HIGH (ref 70–99)
Sodium: 140 mEq/L (ref 135–145)

## 2010-09-11 SURGERY — HYSTEROSCOPY, USING RESECTOSCOPE
Anesthesia: General

## 2010-09-11 MED ORDER — FENTANYL CITRATE 0.05 MG/ML IJ SOLN: 25.0000 ug | INTRAMUSCULAR | Status: DC | PRN

## 2010-09-11 MED ORDER — KETOROLAC TROMETHAMINE 30 MG/ML IJ SOLN
INTRAMUSCULAR | Status: DC | PRN
  Administered 2010-09-11: 30 mg via INTRAVENOUS

## 2010-09-11 MED ORDER — GLYCINE 1.5 % IR SOLN
Status: DC | PRN
  Administered 2010-09-11 (×2): 3000 mL

## 2010-09-11 MED ORDER — GLYCOPYRROLATE 0.2 MG/ML IJ SOLN
INTRAMUSCULAR | Status: DC | PRN
  Administered 2010-09-11: 0.2 mg via INTRAVENOUS

## 2010-09-11 MED ORDER — KETOROLAC TROMETHAMINE 30 MG/ML IJ SOLN: 15.0000 mg | Freq: Once | INTRAMUSCULAR | Status: DC | PRN

## 2010-09-11 MED ORDER — MIDAZOLAM HCL 5 MG/5ML IJ SOLN
INTRAMUSCULAR | Status: DC | PRN
  Administered 2010-09-11: 2 mg via INTRAVENOUS

## 2010-09-11 MED ORDER — LIDOCAINE HCL 2 % IJ SOLN
INTRAMUSCULAR | Status: DC | PRN
  Administered 2010-09-11: 20 mL

## 2010-09-11 MED ORDER — HYDROCODONE-ACETAMINOPHEN 5-500 MG PO TABS: 1.0000 | ORAL_TABLET | Freq: Four times a day (QID) | ORAL | Status: AC | PRN

## 2010-09-11 MED ORDER — DEXAMETHASONE SODIUM PHOSPHATE 4 MG/ML IJ SOLN
INTRAMUSCULAR | Status: DC | PRN
  Administered 2010-09-11: 10 mg via INTRAVENOUS

## 2010-09-11 MED ORDER — ONDANSETRON HCL 4 MG/2ML IJ SOLN
INTRAMUSCULAR | Status: DC | PRN
  Administered 2010-09-11: 4 mg via INTRAVENOUS

## 2010-09-11 MED ORDER — LIDOCAINE HCL (CARDIAC) 20 MG/ML IV SOLN
INTRAVENOUS | Status: DC | PRN
  Administered 2010-09-11: 80 mg via INTRAVENOUS

## 2010-09-11 MED ORDER — PROPOFOL 10 MG/ML IV EMUL
INTRAVENOUS | Status: DC | PRN
  Administered 2010-09-11: 200 mg via INTRAVENOUS

## 2010-09-11 MED ORDER — IBUPROFEN 200 MG PO TABS: 200.0000 mg | ORAL_TABLET | Freq: Four times a day (QID) | ORAL | Status: AC | PRN

## 2010-09-11 MED ORDER — LACTATED RINGERS IV SOLN
INTRAVENOUS | Status: DC
  Administered 2010-09-11 (×2): via INTRAVENOUS

## 2010-09-11 MED ORDER — FENTANYL CITRATE 0.05 MG/ML IJ SOLN
INTRAMUSCULAR | Status: DC | PRN
  Administered 2010-09-11 (×2): 50 ug via INTRAVENOUS

## 2010-09-11 SURGICAL SUPPLY — 22 items
ABLATOR ENDOMETRIAL BIPOLAR (ABLATOR) IMPLANT
CANISTER SUCTION 2500CC (MISCELLANEOUS) ×2 IMPLANT
CATH ROBINSON RED A/P 16FR (CATHETERS) ×2 IMPLANT
CLOTH BEACON ORANGE TIMEOUT ST (SAFETY) ×2 IMPLANT
CONTAINER PREFILL 10% NBF 60ML (FORM) ×4 IMPLANT
DRAPE UTILITY XL STRL (DRAPES) ×2 IMPLANT
DRESSING TELFA 8X3 (GAUZE/BANDAGES/DRESSINGS) ×2 IMPLANT
ELECT REM PT RETURN 9FT ADLT (ELECTROSURGICAL) ×2
ELECTRODE REM PT RTRN 9FT ADLT (ELECTROSURGICAL) ×1 IMPLANT
ELECTRODE ROLLER BARREL 22FR (ELECTROSURGICAL) IMPLANT
ELECTRODE VAPORCUT 22FR (ELECTROSURGICAL) IMPLANT
GLOVE BIO SURGEON STRL SZ 6.5 (GLOVE) ×2 IMPLANT
GLOVE BIOGEL PI IND STRL 7.0 (GLOVE) ×1 IMPLANT
GLOVE BIOGEL PI INDICATOR 7.0 (GLOVE) ×1
GOWN PREVENTION PLUS LG XLONG (DISPOSABLE) ×4 IMPLANT
LOOP ANGLED CUTTING 22FR (CUTTING LOOP) IMPLANT
PACK HYSTEROSCOPY LF (CUSTOM PROCEDURE TRAY) ×2 IMPLANT
PACK VAGINAL MINOR WOMEN LF (CUSTOM PROCEDURE TRAY) ×2 IMPLANT
PAD PREP 24X48 CUFFED NSTRL (MISCELLANEOUS) ×2 IMPLANT
SYR 20CC LL (SYRINGE) ×2 IMPLANT
TOWEL OR 17X24 6PK STRL BLUE (TOWEL DISPOSABLE) ×4 IMPLANT
WATER STERILE IRR 1000ML POUR (IV SOLUTION) ×2 IMPLANT

## 2010-09-11 NOTE — Anesthesia Postprocedure Evaluation (Signed)
  Anesthesia Post-op Note  Patient: Glenda Lyons  Procedure(s) Performed:  HYSTEROSCOPY WITH RESECTOSCOPE; DILATATION AND CURETTAGE (D&C)  No anesthesia complications.  Level of consciousness: alert. Cardiopulmonary status stable.  No follow-up care or observation required.  Noreta Kue L. Rodman Pickle, MD

## 2010-09-11 NOTE — Op Note (Signed)
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 An in out catheter was used to drain the bladder a bivalve speculum was placed into the vagina and anterior lip of the cervix was grasped with a single-tooth tenaculum.  20 cc of 2% lidocaine was used for cervical block.  the cervix was then dilated with Shawnie Pons dilators up to 21. The hysteroscope was placed into the uterine cavity. The cervix and the entire uterus and both ostia were visualized. There was a large 4cm polyp that started at the fundus to the internal os.  Hysteroscope was then removed from the uterus. The polyp forceps were used to remove part of the polyp.  The cervix was further dilated.  The resectoscope was then used to remove some more of the polyp.  The polyp forceps then was again used to remove the remainder of the polyp.  The hysteroscope was placed again into the uterine cavity, and the entire polyp had been removed.  curettings were sent to pathology.   The tenaculum was removed from the cervix and hemostasis was noted.  The procedure took an hour to do.   PLAN OF CARE: discharge to home  PATIENT DISPOSITION:  PACU - hemodynamically stable.

## 2010-09-11 NOTE — Anesthesia Preprocedure Evaluation (Addendum)
Anesthesia Evaluation  Name, MR# and DOB Patient awake  General Assessment Comment  Reviewed: Allergy & Precautions, H&P , Patient's Chart, lab work & pertinent test results and reviewed documented beta blocker date and time   Airway Mallampati: II TM Distance: >3 FB Neck ROM: full    Dental No notable dental hx    Pulmonary  clear to auscultation  pulmonary exam normal   Cardiovascular Hypertension: controlled today. On Medications regular Normal   Neuro/Psych  GI/Hepatic/Renal   Endo/Other   Abdominal   Musculoskeletal  Hematology   Peds  Reproductive/Obstetrics   Anesthesia Other Findings             Anesthesia Physical Anesthesia Plan  ASA: II  Anesthesia Plan: General   Post-op Pain Management:    Induction: Intravenous  Airway Management Planned: LMA  Additional Equipment:   Intra-op Plan:   Post-operative Plan:   Informed Consent: I have reviewed the patients History and Physical, chart, labs and discussed the procedure including the risks, benefits and alternatives for the proposed anesthesia with the patient or authorized representative who has indicated his/her understanding and acceptance.   Dental Advisory Given  Plan Discussed with: CRNA and Surgeon  Anesthesia Plan Comments: (  Discussed  general anesthesia, including possible nausea, instrumentation of airway, sore throat,pulmonary aspiration, etc. I asked if the were any outstanding questions, or  concerns before we proceeded. )       Anesthesia Quick Evaluation

## 2010-09-11 NOTE — H&P (Signed)
Glenda Lyons, Glenda Lyons               ACCOUNT NO.:  [id number]  MEDICAL RECORD NO.:  [id number]  LOCATION:                                 FACILITY:  PHYSICIAN:  Gerianne Simonet A. Yarianna Varble, M.D. DATE OF BIRTH:  08-09-1945  DATE OF ADMISSION: DATE OF DISCHARGE:                             HISTORY & PHYSICAL   CHIEF COMPLAINT:  Irregular vaginal bleeding and endometrial mass.  HISTORY AND PHYSICAL:  The patient is a 65 year old nulligravida patient who presented to me in July 2012 complaining of irregular vaginal bleeding that has been intermittent for the past 6 months with the occurrence 1-6 weeks apart.  She changes the tampon just each day that she is bleeding and will bleed may be 4-5 days.  She denies being any contraception, have any history of fibroids, or being on any hormone therapy.  She has not been on any new medications and has no menopausal symptoms.  Does not have any increased abdominal pain or distress.  Her last ultrasound was 10-12 years ago which was normal and she is not on any blood thinners.  She did go to her family doctor who did GC and Chlamydia which were negative and a urine culture which was positive for E. coli.  The patient had an ultrasound which showed her uterus to be 7.54 x 4.4 x 4.04 with normal ovaries and on sonohysterogram she had a 4.6 x 5.14 cm mass.  Her endometrial biopsy was negative.  It was significant for small fragments of benign cuboidal epithelium consistent with endometrial lining.  PAST MEDICAL HISTORY: 1. Chronic back pain secondary to herniated disk. 2. Chronic hypertension. 3. Depression. 4. Hypercholesterolemia.  PAST SURGICAL HISTORY:  Tonsillectomy and adenoids removed, wisdom teeth, right breast biopsy, colposcopy, and LEEP in the past.  MEDICATIONS:  Diovan, Zetia, simvastatin, sertraline, Niaspan, naloxone, and fluconazole as needed.  ALLERGIES:  The patient has no known drug allergies.  SOCIAL HISTORY:  The patient denies  having any alcohol, drug, or illicit drug use.  FAMILY HISTORY:  Unremarkable.  REVIEW OF SYSTEMS:  GENITOURINARY:  Urinary tract infection and irregular bleeding.  MUSCULOSKELETAL:  No weakness.  HEART:  No palpitations.  RESPIRATORY:  Occasional wheezing.  PHYSICAL EXAMINATION:  VITAL SIGNS:  Blood pressure is 122/72, her weight is 176, and height is 5 feet 5-1/2 inches. HEENT:  Pupils are equal.  Hearing is normal.  Throat is clear.  Thyroid is not enlarged. HEART:  Regular rate and rhythm. CHEST:  Clear to auscultation bilaterally. BACK:  No CVA tenderness. BREASTS:  Deferred per patient. MUSCULOSKELETAL:  No cyanosis, clubbing, or edema. NEUROLOGIC:  Within normal limits. GENITOURINARY:  Vagina is atrophic.  Cervix is nontender without any lesions.  Uterus is normal shape, size, and consistency.  Adnexa is nontender.  ASSESSMENT:  Postmenopausal vaginal bleeding with endometrial mass.  PLAN:  D and C hysteroscopy and removal of the mass.  The patient understands the risks but are not limited to bleeding, infection, damage to internal organs or perforation of the uterus which could damage the bowel, bladder, and major blood vessels.     Jaevin Medearis A. Normand Sloop, M.D.     NAD/MEDQ  D:  09/10/2010  T:  09/10/2010  Job:  [id number]

## 2010-09-11 NOTE — Transfer of Care (Signed)
Immediate Anesthesia Transfer of Care Note  Patient: Glenda Lyons  Procedure(s) Performed:  HYSTEROSCOPY WITH RESECTOSCOPE; DILATATION AND CURETTAGE (D&C)  Patient Location: PACU  Anesthesia Type: General  Level of Consciousness: awake and sedated  Airway & Oxygen Therapy: Patient Spontanous Breathing and Patient connected to nasal cannula oxygen  Post-op Assessment: Report given to PACU RN and Post -op Vital signs reviewed and stable  Post vital signs: Reviewed and stable  Complications: No apparent anesthesia complications

## 2010-09-12 ENCOUNTER — Encounter (HOSPITAL_COMMUNITY): Payer: Self-pay | Admitting: Obstetrics and Gynecology

## 2010-10-08 ENCOUNTER — Other Ambulatory Visit: Payer: Self-pay | Admitting: Family Medicine

## 2010-10-22 ENCOUNTER — Other Ambulatory Visit: Payer: Self-pay | Admitting: Family Medicine

## 2010-10-22 DIAGNOSIS — E785 Hyperlipidemia, unspecified: Secondary | ICD-10-CM

## 2010-10-22 DIAGNOSIS — I10 Essential (primary) hypertension: Secondary | ICD-10-CM

## 2010-10-23 ENCOUNTER — Other Ambulatory Visit (INDEPENDENT_AMBULATORY_CARE_PROVIDER_SITE_OTHER): Payer: 59

## 2010-10-23 DIAGNOSIS — E785 Hyperlipidemia, unspecified: Secondary | ICD-10-CM

## 2010-10-23 DIAGNOSIS — I10 Essential (primary) hypertension: Secondary | ICD-10-CM

## 2010-10-23 LAB — BASIC METABOLIC PANEL
BUN: 22 mg/dL (ref 6–23)
Calcium: 9.4 mg/dL (ref 8.4–10.5)
Creatinine, Ser: 0.7 mg/dL (ref 0.4–1.2)

## 2010-10-23 LAB — LIPID PANEL
Cholesterol: 142 mg/dL (ref 0–200)
HDL: 70.8 mg/dL (ref >39.00)
LDL Cholesterol: 59 mg/dL (ref 0–99)
Total CHOL/HDL Ratio: 2
Triglycerides: 59 mg/dL (ref 0.0–149.0)

## 2010-10-23 LAB — HEPATIC FUNCTION PANEL
Bilirubin, Direct: 0.1 mg/dL (ref 0.0–0.3)
Total Bilirubin: 0.5 mg/dL (ref 0.3–1.2)
Total Protein: 7.4 g/dL (ref 6.0–8.3)

## 2010-10-23 NOTE — Progress Notes (Signed)
Labs only

## 2010-11-05 ENCOUNTER — Encounter: Payer: Self-pay | Admitting: Family Medicine

## 2010-12-03 ENCOUNTER — Other Ambulatory Visit: Payer: Self-pay | Admitting: Family Medicine

## 2010-12-19 DIAGNOSIS — H5000 Unspecified esotropia: Secondary | ICD-10-CM

## 2010-12-19 HISTORY — DX: Unspecified esotropia: H50.00

## 2010-12-20 ENCOUNTER — Other Ambulatory Visit: Payer: Self-pay | Admitting: Family Medicine

## 2010-12-20 MED ORDER — SERTRALINE HCL 100 MG PO TABS: 75.0000 mg | ORAL_TABLET | Freq: Every day | ORAL | Status: DC

## 2010-12-20 NOTE — Telephone Encounter (Signed)
Faxed.   KP 

## 2011-02-13 ENCOUNTER — Encounter: Payer: Self-pay | Admitting: Family Medicine

## 2011-02-13 ENCOUNTER — Ambulatory Visit (INDEPENDENT_AMBULATORY_CARE_PROVIDER_SITE_OTHER): Payer: 59 | Admitting: Family Medicine

## 2011-02-13 VITALS — BP 130/80 | HR 71 | Temp 99.0°F | Wt 176.4 lb

## 2011-02-13 DIAGNOSIS — J302 Other seasonal allergic rhinitis: Secondary | ICD-10-CM

## 2011-02-13 DIAGNOSIS — J309 Allergic rhinitis, unspecified: Secondary | ICD-10-CM

## 2011-02-13 DIAGNOSIS — H532 Diplopia: Secondary | ICD-10-CM

## 2011-02-13 DIAGNOSIS — F32A Depression, unspecified: Secondary | ICD-10-CM

## 2011-02-13 DIAGNOSIS — F329 Major depressive disorder, single episode, unspecified: Secondary | ICD-10-CM

## 2011-02-13 DIAGNOSIS — E785 Hyperlipidemia, unspecified: Secondary | ICD-10-CM

## 2011-02-13 DIAGNOSIS — I10 Essential (primary) hypertension: Secondary | ICD-10-CM

## 2011-02-13 MED ORDER — SERTRALINE HCL 100 MG PO TABS: 150.0000 mg | ORAL_TABLET | Freq: Every day | ORAL | Status: DC

## 2011-02-13 MED ORDER — FLUTICASONE PROPIONATE 50 MCG/ACT NA SUSP: 2.0000 | Freq: Every evening | NASAL | Status: DC | PRN

## 2011-02-13 MED ORDER — NIACIN ER (ANTIHYPERLIPIDEMIC) 1000 MG PO TBCR: 1000.0000 mg | EXTENDED_RELEASE_TABLET | Freq: Every day | ORAL | Status: DC

## 2011-02-13 NOTE — Patient Instructions (Signed)

## 2011-02-13 NOTE — Progress Notes (Signed)
  Subjective:    Patient here for follow-up of elevated blood pressure.  She  is adherent to a low-salt diet.  Blood pressure is not checked at home. Cardiac symptoms: none. Patient denies: chest pain, chest pressure/discomfort, claudication, dyspnea, exertional chest pressure/discomfort, fatigue, irregular heart beat, lower extremity edema, near-syncope, orthopnea, palpitations, paroxysmal nocturnal dyspnea, syncope and tachypnea. Cardiovascular risk factors: advanced age (older than 3 for men, 2 for women), dyslipidemia, hypertension and sedentary lifestyle. Use of agents associated with hypertension: none. History of target organ damage: none.  The following portions of the patient's history were reviewed and updated as appropriate: allergies, current medications, past family history, past medical history, past social history, past surgical history and problem list.  Review of Systems Pertinent items are noted in HPI.     Objective:    BP 130/80  Pulse 71  Temp(Src) 99 F (37.2 C) (Oral)  Wt 176 lb 6.4 oz (80.015 kg)  SpO2 96% General appearance: alert, cooperative, appears stated age and no distress Lungs: clear to auscultation bilaterally Heart: regular rate and rhythm, S1, S2 normal, no murmur, click, rub or gallop Extremities: extremities normal, atraumatic, no cyanosis or edema    Assessment:    Hypertension, normal blood pressure . Evidence of target organ damage: none.    Plan:    Medication: no change. Dietary sodium restriction. Regular aerobic exercise. Check blood pressures 2-3 times weekly and record. Follow up: 6 months and as needed.

## 2011-03-17 ENCOUNTER — Other Ambulatory Visit: Payer: 59

## 2011-03-26 ENCOUNTER — Telehealth: Payer: Self-pay | Admitting: Internal Medicine

## 2011-03-26 NOTE — Telephone Encounter (Signed)
Received copies from [facility],on 03/26/11. Forwarded 2pages to Dr. Nobie Putnam review.

## 2011-07-02 ENCOUNTER — Other Ambulatory Visit: Payer: Self-pay | Admitting: Family Medicine

## 2011-07-02 DIAGNOSIS — Z1231 Encounter for screening mammogram for malignant neoplasm of breast: Secondary | ICD-10-CM

## 2011-07-16 ENCOUNTER — Other Ambulatory Visit: Payer: Self-pay | Admitting: Family Medicine

## 2011-07-24 ENCOUNTER — Ambulatory Visit
Admission: RE | Admit: 2011-07-24 | Discharge: 2011-07-24 | Disposition: A | Discharge status: Home / Self Care | Payer: Medicare Other | Source: Ambulatory Visit | Attending: Family Medicine | Admitting: Family Medicine

## 2011-07-24 DIAGNOSIS — Z1231 Encounter for screening mammogram for malignant neoplasm of breast: Secondary | ICD-10-CM

## 2011-08-13 ENCOUNTER — Encounter: Payer: Self-pay | Admitting: Obstetrics and Gynecology

## 2011-08-21 DIAGNOSIS — H5789 Other specified disorders of eye and adnexa: Secondary | ICD-10-CM

## 2011-08-21 DIAGNOSIS — E0500 Thyrotoxicosis with diffuse goiter without thyrotoxic crisis or storm: Secondary | ICD-10-CM | POA: Insufficient documentation

## 2011-08-21 DIAGNOSIS — E079 Disorder of thyroid, unspecified: Secondary | ICD-10-CM

## 2011-08-21 HISTORY — DX: Thyrotoxicosis with diffuse goiter without thyrotoxic crisis or storm: E05.00

## 2011-08-21 HISTORY — DX: Other specified disorders of eye and adnexa: H57.89

## 2011-08-21 HISTORY — DX: Disorder of thyroid, unspecified: E07.9

## 2011-09-08 ENCOUNTER — Other Ambulatory Visit: Payer: Self-pay | Admitting: Family Medicine

## 2011-09-08 NOTE — Telephone Encounter (Signed)
Last seen 02/13/11 and filled 07/25/10 # 30 with 5 refills. Please advise     KP

## 2011-09-10 ENCOUNTER — Encounter: Payer: Self-pay | Admitting: Obstetrics and Gynecology

## 2011-09-10 ENCOUNTER — Ambulatory Visit (INDEPENDENT_AMBULATORY_CARE_PROVIDER_SITE_OTHER): Payer: Medicare Other | Admitting: Obstetrics and Gynecology

## 2011-09-10 VITALS — BP 138/80 | Resp 16 | Ht 65.0 in | Wt 173.0 lb

## 2011-09-10 DIAGNOSIS — Z124 Encounter for screening for malignant neoplasm of cervix: Secondary | ICD-10-CM

## 2011-09-10 LAB — HM PAP SMEAR: HM Pap smear: NORMAL

## 2011-09-10 NOTE — Progress Notes (Signed)
Last Pap: 08/21/2010  WNL: Yes Regular Periods:no Contraception: N/A  Monthly Breast exam:yes Tetanus<48yrs:no Nl.Bladder Function:yes Daily BMs:yes Healthy Diet:yes Calcium:yes Mammogram:yes Date of Mammogram: spring "2013"  Received Dense Breast letter Exercise:yes Have often Exercise: 3x a week Seatbelt: yes Abuse at home: no Stressful work:no Sigmoid-colonoscopy: "2006" WNL Bone Density: Yes WNL PCP: [name] Change in PMH: No changes Change in FMH:No Changes Physical Examination: Neck - supple, no significant adenopathy Chest - clear to auscultation, no wheezes, rales or rhonchi, symmetric air entry Heart - normal rate, regular rhythm, normal S1, S2, no murmurs, rubs, clicks or gallops Abdomen - soft, nontender, nondistended, no masses or organomegaly Breasts - breasts appear normal, no suspicious masses, no skin or nipple changes or axillary nodes Pelvic - normal external genitalia, vulva, vagina, cervix, uterus and adnexa, atrophic Rectal - normal rectal, no masses Musculoskeletal - no joint tenderness, deformity or swelling Extremities - peripheral pulses normal, no pedal edema, no clubbing or cyanosis Skin - normal coloration and turgor, no rashes, no suspicious skin lesions noted Normal AEX Pt due for mammogram no colonoscopy due no Pap done yes RT one year Diet and exercise discussed Pt given a dense breast letter.  Mammogram reviewed and the implications of dense breasts reviewed.  Pt considering genetic testing .  Information given to pt

## 2011-09-12 LAB — PAP IG W/ RFLX HPV ASCU

## 2011-09-25 ENCOUNTER — Encounter: Payer: Self-pay | Admitting: Internal Medicine

## 2011-10-10 ENCOUNTER — Other Ambulatory Visit: Payer: Self-pay

## 2011-10-10 MED ORDER — EZETIMIBE 10 MG PO TABS: 10.0000 mg | ORAL_TABLET | Freq: Every day | ORAL | Status: DC

## 2011-10-10 MED ORDER — VALSARTAN-HYDROCHLOROTHIAZIDE 80-12.5 MG PO TABS: 1.0000 | ORAL_TABLET | Freq: Every day | ORAL | Status: DC

## 2011-10-10 MED ORDER — SIMVASTATIN 40 MG PO TABS: 40.0000 mg | ORAL_TABLET | Freq: Every day | ORAL | Status: DC

## 2011-10-29 DIAGNOSIS — H50011 Monocular esotropia, right eye: Secondary | ICD-10-CM

## 2011-10-29 HISTORY — DX: Monocular esotropia, right eye: H50.011

## 2011-11-07 ENCOUNTER — Telehealth: Payer: Self-pay | Admitting: *Deleted

## 2011-11-07 NOTE — Telephone Encounter (Signed)
Left message on answering machine.  Sent no show letter.

## 2011-11-21 ENCOUNTER — Encounter: Payer: Medicare Other | Admitting: Internal Medicine

## 2011-12-11 ENCOUNTER — Encounter: Payer: Self-pay | Admitting: Family Medicine

## 2011-12-11 ENCOUNTER — Ambulatory Visit (INDEPENDENT_AMBULATORY_CARE_PROVIDER_SITE_OTHER): Payer: 59 | Admitting: Family Medicine

## 2011-12-11 VITALS — BP 132/74 | HR 74 | Temp 99.0°F | Ht 65.0 in | Wt 172.4 lb

## 2011-12-11 DIAGNOSIS — E785 Hyperlipidemia, unspecified: Secondary | ICD-10-CM

## 2011-12-11 DIAGNOSIS — I10 Essential (primary) hypertension: Secondary | ICD-10-CM

## 2011-12-11 DIAGNOSIS — Z23 Encounter for immunization: Secondary | ICD-10-CM

## 2011-12-11 DIAGNOSIS — E069 Thyroiditis, unspecified: Secondary | ICD-10-CM

## 2011-12-11 DIAGNOSIS — Z0000 Encounter for general adult medical examination without abnormal findings: Secondary | ICD-10-CM

## 2011-12-11 DIAGNOSIS — N390 Urinary tract infection, site not specified: Secondary | ICD-10-CM

## 2011-12-11 LAB — CBC WITH DIFFERENTIAL/PLATELET
Basophils Relative: 0.3 % (ref 0.0–3.0)
Eosinophils Absolute: 0.1 10*3/uL (ref 0.0–0.7)
MCHC: 32.6 g/dL (ref 30.0–36.0)
MCV: 96.2 fl (ref 78.0–100.0)
Monocytes Absolute: 0.5 10*3/uL (ref 0.1–1.0)
Neutro Abs: 4.9 10*3/uL (ref 1.4–7.7)
Neutrophils Relative %: 71.1 % (ref 43.0–77.0)
RBC: 4.78 Mil/uL (ref 3.87–5.11)

## 2011-12-11 LAB — POCT URINALYSIS DIPSTICK
Bilirubin, UA: NEGATIVE
Ketones, UA: NEGATIVE
pH, UA: 6

## 2011-12-11 LAB — T4, FREE: Free T4: 0.69 ng/dL (ref 0.60–1.60)

## 2011-12-11 LAB — BASIC METABOLIC PANEL
CO2: 27 mEq/L (ref 19–32)
Chloride: 106 mEq/L (ref 96–112)
Creatinine, Ser: 0.8 mg/dL (ref 0.4–1.2)

## 2011-12-11 LAB — LIPID PANEL
HDL: 81.3 mg/dL (ref >39.00)
Triglycerides: 92 mg/dL (ref 0.0–149.0)

## 2011-12-11 LAB — HEPATIC FUNCTION PANEL
Albumin: 4.5 g/dL (ref 3.5–5.2)
Total Protein: 7.5 g/dL (ref 6.0–8.3)

## 2011-12-11 MED ORDER — SIMVASTATIN 40 MG PO TABS: 40.0000 mg | ORAL_TABLET | Freq: Every day | ORAL | Status: DC

## 2011-12-11 MED ORDER — VALSARTAN-HYDROCHLOROTHIAZIDE 80-12.5 MG PO TABS: 1.0000 | ORAL_TABLET | Freq: Every day | ORAL | Status: DC

## 2011-12-11 MED ORDER — NIACIN ER (ANTIHYPERLIPIDEMIC) 1000 MG PO TBCR: 1000.0000 mg | EXTENDED_RELEASE_TABLET | Freq: Every day | ORAL | Status: DC

## 2011-12-11 MED ORDER — EZETIMIBE 10 MG PO TABS: 10.0000 mg | ORAL_TABLET | Freq: Every day | ORAL | Status: DC

## 2011-12-11 NOTE — Progress Notes (Signed)
Subjective:    Glenda Lyons is a 66 y.o. female who presents for Medicare Initial preventive examination.  Preventive Screening-Counseling & Management  Tobacco History  Smoking status  . Never Smoker   Smokeless tobacco  . Never Used     Problems Prior to Visit 1.   Current Problems (verified) Patient Active Problem List  Diagnosis  . HYPERLIPIDEMIA  . DEPRESSION  . HYPERTENSION  . UNSPECIFIED PERIPHERAL VASCULAR DISEASE  . POSTMENOPAUSAL STATUS  . OSTEOARTHRITIS  . HERNIATED DISC  . BURSITIS, RIGHT HIP  . COLONIC POLYPS, HX OF  . Dysuria    Medications Prior to Visit Current Outpatient Prescriptions on File Prior to Visit  Medication Sig Dispense Refill  . ezetimibe (ZETIA) 10 MG tablet Take 1 tablet (10 mg total) by mouth daily.  30 tablet  1  . fluticasone (FLONASE) 50 MCG/ACT nasal spray Place 2 sprays into the nose at bedtime as needed. in each nostril  16 g  5  . meloxicam (MOBIC) 15 MG tablet take 1/2 to 1 tablet by mouth once daily if needed  30 tablet  5  . niacin (NIASPAN) 1000 MG CR tablet Take 1 tablet (1,000 mg total) by mouth at bedtime.  30 tablet  5  . sertraline (ZOLOFT) 100 MG tablet Take 1.5 tablets (150 mg total) by mouth daily.  45 tablet  11  . simvastatin (ZOCOR) 40 MG tablet Take 1 tablet (40 mg total) by mouth at bedtime.  30 tablet  1  . valsartan-hydrochlorothiazide (DIOVAN-HCT) 80-12.5 MG per tablet Take 1 tablet by mouth daily.  30 tablet  1    Current Medications (verified) Current Outpatient Prescriptions  Medication Sig Dispense Refill  . ezetimibe (ZETIA) 10 MG tablet Take 1 tablet (10 mg total) by mouth daily.  30 tablet  1  . fluticasone (FLONASE) 50 MCG/ACT nasal spray Place 2 sprays into the nose at bedtime as needed. in each nostril  16 g  5  . meloxicam (MOBIC) 15 MG tablet take 1/2 to 1 tablet by mouth once daily if needed  30 tablet  5  . niacin (NIASPAN) 1000 MG CR tablet Take 1 tablet (1,000 mg total) by mouth at  bedtime.  30 tablet  5  . sertraline (ZOLOFT) 100 MG tablet Take 1.5 tablets (150 mg total) by mouth daily.  45 tablet  11  . simvastatin (ZOCOR) 40 MG tablet Take 1 tablet (40 mg total) by mouth at bedtime.  30 tablet  1  . valsartan-hydrochlorothiazide (DIOVAN-HCT) 80-12.5 MG per tablet Take 1 tablet by mouth daily.  30 tablet  1     Allergies (verified) Review of patient's allergies indicates no known allergies.   PAST HISTORY  Family History Family History  Problem Relation Age of Onset  . Breast cancer    . Heart attack    . Lymphoma      Social History History  Substance Use Topics  . Smoking status: Never Smoker   . Smokeless tobacco: Never Used  . Alcohol Use: Yes     occasionally     Are there smokers in your home (other than you)? No  Risk Factors Current exercise habits: The patient does not participate in regular exercise at present.  Dietary issues discussed: na   Cardiac risk factors: dyslipidemia, hypertension and sedentary lifestyle.  Depression Screen (Note: if answer to either of the following is "Yes", a more complete depression screening is indicated)   Over the past 2 weeks, have you  felt down, depressed or hopeless? No  Over the past 2 weeks, have you felt little interest or pleasure in doing things? No  Have you lost interest or pleasure in daily life? No  Do you often feel hopeless? No  Do you cry easily over simple problems? No  Activities of Daily Living In your present state of health, do you have any difficulty performing the following activities?:  Driving? No Managing money?  No Feeding yourself? No Getting from bed to chair? No Climbing a flight of stairs? No Preparing food and eating?: No Bathing or showering? No Getting dressed: No Getting to the toilet? No Using the toilet:No Moving around from place to place: No In the past year have you fallen or had a near fall?:No   Are you sexually active?  Yes  Do you have more than  one partner?  No  Hearing Difficulties: No Do you often ask people to speak up or repeat themselves? No Do you experience ringing or noises in your ears? No Do you have difficulty understanding soft or whispered voices? No   Do you feel that you have a problem with memory? No  Do you often misplace items? No  Do you feel safe at home?  Yes  Cognitive Testing  Alert? Yes  Normal Appearance?Yes  Oriented to person? Yes  Place? Yes   Time? Yes  Recall of three objects?  Yes  Can perform simple calculations? Yes  Displays appropriate judgment?Yes  Can read the correct time from a watch face?Yes   Advanced Directives have been discussed with the patient? Yes  List the Names of Other Physician/Practitioners you currently use: 1. Optometrist-- Dr Daphine Deutscher, opth sugeon-- Elayne Guerin  2.dentist-- Allena Katz 3. Ortho-- votek 4  Gyn-- dillard Indicate any recent Medical Services you may have received from other than [facility] providers in the past year (date may be approximate).  Immunization History  Administered Date(s) Administered  . Influenza Whole 09/19/2008  . Pneumococcal Polysaccharide 03/13/2010  . Td 03/13/2010    Screening Tests Health Maintenance  Topic Date Due  . Influenza Vaccine  10/11/2012  . Mammogram  07/23/2013  . Colonoscopy  12/05/2018  . Tetanus/tdap  03/13/2020  . Pneumococcal Polysaccharide Vaccine Age 57 And Over  Completed  . Zostavax  Completed    All answers were reviewed with the patient and necessary referrals were made:  [name], DO   12/11/2011   History reviewed:  She  has a past medical history of Depression; Hypertension; Hyperlipemia; Osteoarthritis; and Neuromuscular disorder. She  does not have any pertinent problems on file. She  has past surgical history that includes Tonsillectomy; Biopsy breast; LEEP (1994); Hysteroscopy with resectoscope (09/11/2010); Dilation and curettage of uterus (09/11/2010); and Endometrial biopsy. Her family history includes  Breast cancer in an unspecified family member; Heart attack in an unspecified family member; and Lymphoma in an unspecified family member. She  reports that she has never smoked. She has never used smokeless tobacco. She reports that she drinks alcohol. She reports that she does not use illicit drugs. She has a current medication list which includes the following prescription(s): ezetimibe, fluticasone, meloxicam, niacin, sertraline, simvastatin, and valsartan-hydrochlorothiazide. Current Outpatient Prescriptions on File Prior to Visit  Medication Sig Dispense Refill  . ezetimibe (ZETIA) 10 MG tablet Take 1 tablet (10 mg total) by mouth daily.  30 tablet  1  . fluticasone (FLONASE) 50 MCG/ACT nasal spray Place 2 sprays into the nose at bedtime as needed. in each nostril  16 g  5  . meloxicam (MOBIC) 15 MG tablet take 1/2 to 1 tablet by mouth once daily if needed  30 tablet  5  . niacin (NIASPAN) 1000 MG CR tablet Take 1 tablet (1,000 mg total) by mouth at bedtime.  30 tablet  5  . sertraline (ZOLOFT) 100 MG tablet Take 1.5 tablets (150 mg total) by mouth daily.  45 tablet  11  . simvastatin (ZOCOR) 40 MG tablet Take 1 tablet (40 mg total) by mouth at bedtime.  30 tablet  1  . valsartan-hydrochlorothiazide (DIOVAN-HCT) 80-12.5 MG per tablet Take 1 tablet by mouth daily.  30 tablet  1   She  has no known allergies.  Review of Systems  Review of Systems  Constitutional: Negative for activity change, appetite change and fatigue.  HENT: Negative for hearing loss, congestion, tinnitus and ear discharge.   Eyes: positive for visual disturbance---double vision (see optho q1y )  Respiratory: Negative for cough, chest tightness and shortness of breath.   Cardiovascular: Negative for chest pain, palpitations and leg swelling.  Gastrointestinal: Negative for abdominal pain, diarrhea, constipation and abdominal distention.  Genitourinary: Negative for urgency, frequency, decreased urine volume and  difficulty urinating.  Musculoskeletal: Negative for back pain, arthralgias and gait problem.  Skin: Negative for color change, pallor and rash.  Neurological: Negative for dizziness, light-headedness, numbness and headaches.  Hematological: Negative for adenopathy. Does not bruise/bleed easily.  Psychiatric/Behavioral: Negative for suicidal ideas, confusion, sleep disturbance, self-injury, dysphoric mood, decreased concentration and agitation.  Pt is able to read and write and can do all ADLs No risk for falling No abuse/ violence in home      Objective:     Vision by Snellen chart: optho  Body mass index is 28.69 kg/(m^2). BP 132/74  Pulse 74  Temp 99 F (37.2 C) (Oral)  Ht 5\' 5"  (1.651 m)  Wt 172 lb 6.4 oz (78.2 kg)  BMI 28.69 kg/m2  SpO2 98%  BP 132/74  Pulse 74  Temp 99 F (37.2 C) (Oral)  Ht 5\' 5"  (1.651 m)  Wt 172 lb 6.4 oz (78.2 kg)  BMI 28.69 kg/m2  SpO2 98% General appearance: alert, cooperative, appears stated age and no distress Head: Normocephalic, without obvious abnormality, atraumatic Eyes: negative findings: lids and lashes normal and pupils equal, round, reactive to light and accomodation Ears: normal TM's and external ear canals both ears and normal TM&#39;s and external ear canals both ears Nose: Nares normal. Septum midline. Mucosa normal. No drainage or sinus tenderness. Throat: lips, mucosa, and tongue normal; teeth and gums normal Neck: no adenopathy, supple, symmetrical, trachea midline and thyroid not enlarged, symmetric, no tenderness/mass/nodules Back: symmetric, no curvature. ROM normal. No CVA tenderness. Lungs: clear to auscultation bilaterally Breasts: gyn Heart: regular rate and rhythm, S1, S2 normal, no murmur, click, rub or gallop Abdomen: soft, non-tender; bowel sounds normal; no masses,  no organomegaly Pelvic: deferred---gyn Extremities: extremities normal, atraumatic, no cyanosis or edema Pulses: 2+ and symmetric Skin: Skin  color, texture, turgor normal. No rashes or lesions Lymph nodes: Cervical, supraclavicular, and axillary nodes normal. Neurologic: Alert and oriented X 3, normal strength and tone. Normal symmetric reflexes. Normal coordination and gait Psych-- no depression, anxiety     Assessment:     cpe     Plan:     During the course of the visit the patient was educated and counseled about appropriate screening and preventive services including:    Pneumococcal vaccine   Influenza vaccine  Screening electrocardiogram  Screening mammography  Screening Pap smear and pelvic exam   Bone densitometry screening  Colorectal cancer screening  Glaucoma screening  Advanced directives: has NO advanced directive  - add't info requested. Referral to SW: pt will look into this     Patient Instructions (the written plan) was given to the patient.  Medicare Attestation I have personally reviewed: The patient's medical and social history Their use of alcohol, tobacco or illicit drugs Their current medications and supplements The patient's functional ability including ADLs,fall risks, home safety risks, cognitive, and hearing and visual impairment Diet and physical activities Evidence for depression or mood disorders  The patient's weight, height, BMI, and visual acuity have been recorded in the chart.  I have made referrals, counseling, and provided education to the patient based on review of the above and I have provided the patient with a written personalized care plan for preventive services.     [name], DO   12/11/2011

## 2011-12-11 NOTE — Patient Instructions (Addendum)
Preventive Care for Adults, Female A healthy lifestyle and preventive care can promote health and wellness. Preventive health guidelines for women include the following key practices.  A routine yearly physical is a good way to check with your caregiver about your health and preventive screening. It is a chance to share any concerns and updates on your health, and to receive a thorough exam.  Visit your dentist for a routine exam and preventive care every 6 months. Brush your teeth twice a day and floss once a day. Good oral hygiene prevents tooth decay and gum disease.  The frequency of eye exams is based on your age, health, family medical history, use of contact lenses, and other factors. Follow your caregiver's recommendations for frequency of eye exams.  Eat a healthy diet. Foods like vegetables, fruits, whole grains, low-fat dairy products, and lean protein foods contain the nutrients you need without too many calories. Decrease your intake of foods high in solid fats, added sugars, and salt. Eat the right amount of calories for you.Get information about a proper diet from your caregiver, if necessary.  Regular physical exercise is one of the most important things you can do for your health. Most adults should get at least 150 minutes of moderate-intensity exercise (any activity that increases your heart rate and causes you to sweat) each week. In addition, most adults need muscle-strengthening exercises on 2 or more days a week.  Maintain a healthy weight. The body mass index (BMI) is a screening tool to identify possible weight problems. It provides an estimate of body fat based on height and weight. Your caregiver can help determine your BMI, and can help you achieve or maintain a healthy weight.For adults 20 years and older:  A BMI below 18.5 is considered underweight.  A BMI of 18.5 to 24.9 is normal.  A BMI of 25 to 29.9 is considered overweight.  A BMI of 30 and above is  considered obese.  Maintain normal blood lipids and cholesterol levels by exercising and minimizing your intake of saturated fat. Eat a balanced diet with plenty of fruit and vegetables. Blood tests for lipids and cholesterol should begin at age 20 and be repeated every 5 years. If your lipid or cholesterol levels are high, you are over 50, or you are at high risk for heart disease, you may need your cholesterol levels checked more frequently.Ongoing high lipid and cholesterol levels should be treated with medicines if diet and exercise are not effective.  If you smoke, find out from your caregiver how to quit. If you do not use tobacco, do not start.  If you are pregnant, do not drink alcohol. If you are breastfeeding, be very cautious about drinking alcohol. If you are not pregnant and choose to drink alcohol, do not exceed 1 drink per day. One drink is considered to be 12 ounces (355 mL) of beer, 5 ounces (148 mL) of wine, or 1.5 ounces (44 mL) of liquor.  Avoid use of street drugs. Do not share needles with anyone. Ask for help if you need support or instructions about stopping the use of drugs.  High blood pressure causes heart disease and increases the risk of stroke. Your blood pressure should be checked at least every 1 to 2 years. Ongoing high blood pressure should be treated with medicines if weight loss and exercise are not effective.  If you are 55 to 67 years old, ask your caregiver if you should take aspirin to prevent strokes.  Diabetes   screening involves taking a blood sample to check your fasting blood sugar level. This should be done once every 3 years, after age 45, if you are within normal weight and without risk factors for diabetes. Testing should be considered at a younger age or be carried out more frequently if you are overweight and have at least 1 risk factor for diabetes.  Breast cancer screening is essential preventive care for women. You should practice "breast  self-awareness." This means understanding the normal appearance and feel of your breasts and may include breast self-examination. Any changes detected, no matter how small, should be reported to a caregiver. Women in their 20s and 30s should have a clinical breast exam (CBE) by a caregiver as part of a regular health exam every 1 to 3 years. After age 40, women should have a CBE every year. Starting at age 40, women should consider having a mammography (breast X-ray test) every year. Women who have a family history of breast cancer should talk to their caregiver about genetic screening. Women at a high risk of breast cancer should talk to their caregivers about having magnetic resonance imaging (MRI) and a mammography every year.  The Pap test is a screening test for cervical cancer. A Pap test can show cell changes on the cervix that might become cervical cancer if left untreated. A Pap test is a procedure in which cells are obtained and examined from the lower end of the uterus (cervix).  Women should have a Pap test starting at age 21.  Between ages 21 and 29, Pap tests should be repeated every 2 years.  Beginning at age 30, you should have a Pap test every 3 years as long as the past 3 Pap tests have been normal.  Some women have medical problems that increase the chance of getting cervical cancer. Talk to your caregiver about these problems. It is especially important to talk to your caregiver if a new problem develops soon after your last Pap test. In these cases, your caregiver may recommend more frequent screening and Pap tests.  The above recommendations are the same for women who have or have not gotten the vaccine for human papillomavirus (HPV).  If you had a hysterectomy for a problem that was not cancer or a condition that could lead to cancer, then you no longer need Pap tests. Even if you no longer need a Pap test, a regular exam is a good idea to make sure no other problems are  starting.  If you are between ages 65 and 70, and you have had normal Pap tests going back 10 years, you no longer need Pap tests. Even if you no longer need a Pap test, a regular exam is a good idea to make sure no other problems are starting.  If you have had past treatment for cervical cancer or a condition that could lead to cancer, you need Pap tests and screening for cancer for at least 20 years after your treatment.  If Pap tests have been discontinued, risk factors (such as a new sexual partner) need to be reassessed to determine if screening should be resumed.  The HPV test is an additional test that may be used for cervical cancer screening. The HPV test looks for the virus that can cause the cell changes on the cervix. The cells collected during the Pap test can be tested for HPV. The HPV test could be used to screen women aged 30 years and older, and should   be used in women of any age who have unclear Pap test results. After the age of 30, women should have HPV testing at the same frequency as a Pap test.  Colorectal cancer can be detected and often prevented. Most routine colorectal cancer screening begins at the age of 50 and continues through age 75. However, your caregiver may recommend screening at an earlier age if you have risk factors for colon cancer. On a yearly basis, your caregiver may provide home test kits to check for hidden blood in the stool. Use of a small camera at the end of a tube, to directly examine the colon (sigmoidoscopy or colonoscopy), can detect the earliest forms of colorectal cancer. Talk to your caregiver about this at age 50, when routine screening begins. Direct examination of the colon should be repeated every 5 to 10 years through age 75, unless early forms of pre-cancerous polyps or small growths are found.  Hepatitis C blood testing is recommended for all people born from 1945 through 1965 and any individual with known risks for hepatitis C.  Practice  safe sex. Use condoms and avoid high-risk sexual practices to reduce the spread of sexually transmitted infections (STIs). STIs include gonorrhea, chlamydia, syphilis, trichomonas, herpes, HPV, and human immunodeficiency virus (HIV). Herpes, HIV, and HPV are viral illnesses that have no cure. They can result in disability, cancer, and death. Sexually active women aged 25 and younger should be checked for chlamydia. Older women with new or multiple partners should also be tested for chlamydia. Testing for other STIs is recommended if you are sexually active and at increased risk.  Osteoporosis is a disease in which the bones lose minerals and strength with aging. This can result in serious bone fractures. The risk of osteoporosis can be identified using a bone density scan. Women ages 65 and over and women at risk for fractures or osteoporosis should discuss screening with their caregivers. Ask your caregiver whether you should take a calcium supplement or vitamin D to reduce the rate of osteoporosis.  Menopause can be associated with physical symptoms and risks. Hormone replacement therapy is available to decrease symptoms and risks. You should talk to your caregiver about whether hormone replacement therapy is right for you.  Use sunscreen with sun protection factor (SPF) of 30 or more. Apply sunscreen liberally and repeatedly throughout the day. You should seek shade when your shadow is shorter than you. Protect yourself by wearing long sleeves, pants, a wide-brimmed hat, and sunglasses year round, whenever you are outdoors.  Once a month, do a whole body skin exam, using a mirror to look at the skin on your back. Notify your caregiver of new moles, moles that have irregular borders, moles that are larger than a pencil eraser, or moles that have changed in shape or color.  Stay current with required immunizations.  Influenza. You need a dose every fall (or winter). The composition of the flu vaccine  changes each year, so being vaccinated once is not enough.  Pneumococcal polysaccharide. You need 1 to 2 doses if you smoke cigarettes or if you have certain chronic medical conditions. You need 1 dose at age 65 (or older) if you have never been vaccinated.  Tetanus, diphtheria, pertussis (Tdap, Td). Get 1 dose of Tdap vaccine if you are younger than age 65, are over 65 and have contact with an infant, are a healthcare worker, are pregnant, or simply want to be protected from whooping cough. After that, you need a Td   booster dose every 10 years. Consult your caregiver if you have not had at least 3 tetanus and diphtheria-containing shots sometime in your life or have a deep or dirty wound.  HPV. You need this vaccine if you are a woman age 26 or younger. The vaccine is given in 3 doses over 6 months.  Measles, mumps, rubella (MMR). You need at least 1 dose of MMR if you were born in 1957 or later. You may also need a second dose.  Meningococcal. If you are age 19 to 21 and a first-year college student living in a residence hall, or have one of several medical conditions, you need to get vaccinated against meningococcal disease. You may also need additional booster doses.  Zoster (shingles). If you are age 60 or older, you should get this vaccine.  Varicella (chickenpox). If you have never had chickenpox or you were vaccinated but received only 1 dose, talk to your caregiver to find out if you need this vaccine.  Hepatitis A. You need this vaccine if you have a specific risk factor for hepatitis A virus infection or you simply wish to be protected from this disease. The vaccine is usually given as 2 doses, 6 to 18 months apart.  Hepatitis B. You need this vaccine if you have a specific risk factor for hepatitis B virus infection or you simply wish to be protected from this disease. The vaccine is given in 3 doses, usually over 6 months. Preventive Services / Frequency Ages 19 to 39  Blood  pressure check.** / Every 1 to 2 years.  Lipid and cholesterol check.** / Every 5 years beginning at age 20.  Clinical breast exam.** / Every 3 years for women in their 20s and 30s.  Pap test.** / Every 2 years from ages 21 through 29. Every 3 years starting at age 30 through age 65 or 70 with a history of 3 consecutive normal Pap tests.  HPV screening.** / Every 3 years from ages 30 through ages 65 to 70 with a history of 3 consecutive normal Pap tests.  Hepatitis C blood test.** / For any individual with known risks for hepatitis C.  Skin self-exam. / Monthly.  Influenza immunization.** / Every year.  Pneumococcal polysaccharide immunization.** / 1 to 2 doses if you smoke cigarettes or if you have certain chronic medical conditions.  Tetanus, diphtheria, pertussis (Tdap, Td) immunization. / A one-time dose of Tdap vaccine. After that, you need a Td booster dose every 10 years.  HPV immunization. / 3 doses over 6 months, if you are 26 and younger.  Measles, mumps, rubella (MMR) immunization. / You need at least 1 dose of MMR if you were born in 1957 or later. You may also need a second dose.  Meningococcal immunization. / 1 dose if you are age 19 to 21 and a first-year college student living in a residence hall, or have one of several medical conditions, you need to get vaccinated against meningococcal disease. You may also need additional booster doses.  Varicella immunization.** / Consult your caregiver.  Hepatitis A immunization.** / Consult your caregiver. 2 doses, 6 to 18 months apart.  Hepatitis B immunization.** / Consult your caregiver. 3 doses usually over 6 months. Ages 40 to 64  Blood pressure check.** / Every 1 to 2 years.  Lipid and cholesterol check.** / Every 5 years beginning at age 20.  Clinical breast exam.** / Every year after age 40.  Mammogram.** / Every year beginning at age 40   and continuing for as long as you are in good health. Consult with your  caregiver.  Pap test.** / Every 3 years starting at age 30 through age 65 or 70 with a history of 3 consecutive normal Pap tests.  HPV screening.** / Every 3 years from ages 30 through ages 65 to 70 with a history of 3 consecutive normal Pap tests.  Fecal occult blood test (FOBT) of stool. / Every year beginning at age 50 and continuing until age 75. You may not need to do this test if you get a colonoscopy every 10 years.  Flexible sigmoidoscopy or colonoscopy.** / Every 5 years for a flexible sigmoidoscopy or every 10 years for a colonoscopy beginning at age 50 and continuing until age 75.  Hepatitis C blood test.** / For all people born from 1945 through 1965 and any individual with known risks for hepatitis C.  Skin self-exam. / Monthly.  Influenza immunization.** / Every year.  Pneumococcal polysaccharide immunization.** / 1 to 2 doses if you smoke cigarettes or if you have certain chronic medical conditions.  Tetanus, diphtheria, pertussis (Tdap, Td) immunization.** / A one-time dose of Tdap vaccine. After that, you need a Td booster dose every 10 years.  Measles, mumps, rubella (MMR) immunization. / You need at least 1 dose of MMR if you were born in 1957 or later. You may also need a second dose.  Varicella immunization.** / Consult your caregiver.  Meningococcal immunization.** / Consult your caregiver.  Hepatitis A immunization.** / Consult your caregiver. 2 doses, 6 to 18 months apart.  Hepatitis B immunization.** / Consult your caregiver. 3 doses, usually over 6 months. Ages 65 and over  Blood pressure check.** / Every 1 to 2 years.  Lipid and cholesterol check.** / Every 5 years beginning at age 20.  Clinical breast exam.** / Every year after age 40.  Mammogram.** / Every year beginning at age 40 and continuing for as long as you are in good health. Consult with your caregiver.  Pap test.** / Every 3 years starting at age 30 through age 65 or 70 with a 3  consecutive normal Pap tests. Testing can be stopped between 65 and 70 with 3 consecutive normal Pap tests and no abnormal Pap or HPV tests in the past 10 years.  HPV screening.** / Every 3 years from ages 30 through ages 65 or 70 with a history of 3 consecutive normal Pap tests. Testing can be stopped between 65 and 70 with 3 consecutive normal Pap tests and no abnormal Pap or HPV tests in the past 10 years.  Fecal occult blood test (FOBT) of stool. / Every year beginning at age 50 and continuing until age 75. You may not need to do this test if you get a colonoscopy every 10 years.  Flexible sigmoidoscopy or colonoscopy.** / Every 5 years for a flexible sigmoidoscopy or every 10 years for a colonoscopy beginning at age 50 and continuing until age 75.  Hepatitis C blood test.** / For all people born from 1945 through 1965 and any individual with known risks for hepatitis C.  Osteoporosis screening.** / A one-time screening for women ages 65 and over and women at risk for fractures or osteoporosis.  Skin self-exam. / Monthly.  Influenza immunization.** / Every year.  Pneumococcal polysaccharide immunization.** / 1 dose at age 65 (or older) if you have never been vaccinated.  Tetanus, diphtheria, pertussis (Tdap, Td) immunization. / A one-time dose of Tdap vaccine if you are over   65 and have contact with an infant, are a healthcare worker, or simply want to be protected from whooping cough. After that, you need a Td booster dose every 10 years.  Varicella immunization.** / Consult your caregiver.  Meningococcal immunization.** / Consult your caregiver.  Hepatitis A immunization.** / Consult your caregiver. 2 doses, 6 to 18 months apart.  Hepatitis B immunization.** / Check with your caregiver. 3 doses, usually over 6 months. ** Family history and personal history of risk and conditions may change your caregiver's recommendations. Document Released: 03/25/2001 Document Revised: 04/21/2011  Document Reviewed: 06/24/2010 [employer] Patient Information 2013 [employer].  

## 2011-12-11 NOTE — Addendum Note (Signed)
Addended by: Silvio Pate D on: 12/11/2011 01:39 PM   Modules accepted: Orders

## 2011-12-11 NOTE — Assessment & Plan Note (Signed)
con't meds 

## 2011-12-11 NOTE — Assessment & Plan Note (Signed)
Check labs con't meds 

## 2011-12-13 LAB — URINE CULTURE: Colony Count: 80000

## 2012-03-04 ENCOUNTER — Telehealth: Payer: Self-pay | Admitting: Family Medicine

## 2012-03-04 DIAGNOSIS — J302 Other seasonal allergic rhinitis: Secondary | ICD-10-CM

## 2012-03-04 MED ORDER — FLUTICASONE PROPIONATE 50 MCG/ACT NA SUSP: 2.0000 | Freq: Every evening | NASAL | Status: DC | PRN

## 2012-03-04 NOTE — Telephone Encounter (Signed)
Please advise      KP 

## 2012-03-04 NOTE — Telephone Encounter (Signed)
pt wants a bone density test pt. stated she is due pt cb# [phone]

## 2012-03-04 NOTE — Telephone Encounter (Signed)
refill  Fluticasone Prop  50 MCG/ACT Place 2 sprays into the nose at bedtime as needed. in each nostril #16 wt/5-refills last fill 9.20.13 Send to [facility] # [id number] Kathryne Sharper

## 2012-03-04 NOTE — Telephone Encounter (Signed)
Ok to put referral in for bmd---same place last one was done

## 2012-03-04 NOTE — Telephone Encounter (Signed)
msg left for last BMD location---- KP

## 2012-03-10 ENCOUNTER — Other Ambulatory Visit: Payer: Self-pay | Admitting: Family Medicine

## 2012-03-12 ENCOUNTER — Other Ambulatory Visit: Payer: Self-pay | Admitting: Family Medicine

## 2012-03-12 DIAGNOSIS — Z780 Asymptomatic menopausal state: Secondary | ICD-10-CM

## 2012-03-23 ENCOUNTER — Other Ambulatory Visit: Payer: Self-pay | Admitting: Family Medicine

## 2012-04-07 ENCOUNTER — Other Ambulatory Visit: Payer: 59

## 2012-07-08 DIAGNOSIS — L658 Other specified nonscarring hair loss: Secondary | ICD-10-CM

## 2012-07-08 DIAGNOSIS — L821 Other seborrheic keratosis: Secondary | ICD-10-CM

## 2012-07-08 HISTORY — DX: Other seborrheic keratosis: L82.1

## 2012-07-08 HISTORY — DX: Other specified nonscarring hair loss: L65.8

## 2012-08-05 ENCOUNTER — Other Ambulatory Visit: Payer: Self-pay | Admitting: Family Medicine

## 2012-08-26 ENCOUNTER — Ambulatory Visit (INDEPENDENT_AMBULATORY_CARE_PROVIDER_SITE_OTHER): Payer: 59 | Admitting: Family Medicine

## 2012-08-26 ENCOUNTER — Encounter: Payer: Self-pay | Admitting: Family Medicine

## 2012-08-26 VITALS — BP 138/82 | HR 69 | Temp 98.5°F | Wt 171.2 lb

## 2012-08-26 DIAGNOSIS — E2839 Other primary ovarian failure: Secondary | ICD-10-CM

## 2012-08-26 DIAGNOSIS — E785 Hyperlipidemia, unspecified: Secondary | ICD-10-CM

## 2012-08-26 DIAGNOSIS — I10 Essential (primary) hypertension: Secondary | ICD-10-CM

## 2012-08-26 DIAGNOSIS — F329 Major depressive disorder, single episode, unspecified: Secondary | ICD-10-CM

## 2012-08-26 DIAGNOSIS — F32A Depression, unspecified: Secondary | ICD-10-CM

## 2012-08-26 LAB — LIPID PANEL
Cholesterol: 158 mg/dL (ref 0–200)
LDL Cholesterol: 62 mg/dL (ref 0–99)
Total CHOL/HDL Ratio: 2

## 2012-08-26 LAB — BASIC METABOLIC PANEL
BUN: 22 mg/dL (ref 6–23)
Chloride: 106 mEq/L (ref 96–112)
Potassium: 3.2 mEq/L — ABNORMAL LOW (ref 3.5–5.1)

## 2012-08-26 LAB — HEPATIC FUNCTION PANEL
ALT: 21 U/L (ref 0–35)
AST: 22 U/L (ref 0–37)
Alkaline Phosphatase: 52 U/L (ref 39–117)
Total Bilirubin: 0.5 mg/dL (ref 0.3–1.2)

## 2012-08-26 MED ORDER — EZETIMIBE 10 MG PO TABS: 10.0000 mg | ORAL_TABLET | Freq: Every day | ORAL | Status: DC

## 2012-08-26 MED ORDER — SERTRALINE HCL 100 MG PO TABS: ORAL_TABLET | ORAL | Status: DC

## 2012-08-26 NOTE — Patient Instructions (Addendum)

## 2012-08-26 NOTE — Progress Notes (Signed)
  Subjective:    Patient here for follow-up of elevated blood pressure.  She is not exercising and is adherent to a low-salt diet.  Blood pressure is well controlled at home. Cardiac symptoms: none. Patient denies: chest pain, chest pressure/discomfort, claudication, dyspnea, exertional chest pressure/discomfort, fatigue, irregular heart beat, lower extremity edema, near-syncope, orthopnea, palpitations, paroxysmal nocturnal dyspnea, syncope and tachypnea. Cardiovascular risk factors: hyperlipidemia, sedentary lifestyle. Use of agents associated with hypertension: none. History of target organ damage: none.  The following portions of the patient's history were reviewed and updated as appropriate:  She  has a past medical history of Depression; Hypertension; Hyperlipemia; Osteoarthritis; and Neuromuscular disorder. She  does not have any pertinent problems on file. She  has past surgical history that includes Tonsillectomy; Biopsy breast; LEEP (1994); Hysteroscopy with resectoscope (09/11/2010); Dilation and curettage of uterus (09/11/2010); and Endometrial biopsy. Her family history includes Breast cancer in an unspecified family member; Heart attack in an unspecified family member; and Lymphoma in an unspecified family member. She  reports that she has never smoked. She has never used smokeless tobacco. She reports that  drinks alcohol. She reports that she does not use illicit drugs. She has a current medication list which includes the following prescription(s): ezetimibe, finasteride, fluticasone, meloxicam, niacin, sertraline, simvastatin, and valsartan-hydrochlorothiazide. Current Outpatient Prescriptions on File Prior to Visit  Medication Sig Dispense Refill  . fluticasone (FLONASE) 50 MCG/ACT nasal spray Place 2 sprays into the nose at bedtime as needed. in each nostril  16 g  2  . meloxicam (MOBIC) 15 MG tablet take 1/2 to 1 tablet by mouth once daily if needed  30 tablet  5  . niacin (NIASPAN)  1000 MG CR tablet Take 1 tablet (1,000 mg total) by mouth at bedtime.  30 tablet  5  . simvastatin (ZOCOR) 40 MG tablet take 1 tablet by mouth at bedtime  30 tablet  0  . valsartan-hydrochlorothiazide (DIOVAN-HCT) 80-12.5 MG per tablet Take 1 tablet by mouth daily.  30 tablet  5   No current facility-administered medications on file prior to visit.   She has no allergies on file..  Review of Systems Pertinent items are noted in HPI.     Objective:    BP 138/82  Pulse 69  Temp(Src) 98.5 F (36.9 C) (Oral)  Wt 171 lb 3.2 oz (77.656 kg)  BMI 28.49 kg/m2  SpO2 97% General appearance: alert, cooperative, appears stated age and no distress Neck: no adenopathy, no carotid bruit, no JVD, supple, symmetrical, trachea midline and thyroid not enlarged, symmetric, no tenderness/mass/nodules Lungs: clear to auscultation bilaterally Heart: S1, S2 normal Extremities: extremities normal, atraumatic, no cyanosis or edema    Assessment:    Hypertension, normal blood pressure . Evidence of target organ damage: none.    Plan:    Medication: no change. Dietary sodium restriction. Regular aerobic exercise. Check blood pressures 2-3 times weekly and record. Follow up: 6 months and as needed.

## 2012-08-26 NOTE — Assessment & Plan Note (Signed)
Check labs Cont' meds 

## 2012-08-31 ENCOUNTER — Telehealth: Payer: Self-pay | Admitting: Family Medicine

## 2012-08-31 NOTE — Telephone Encounter (Signed)
Patient is returning a missed call from [place]. She is unsure what call was regarding.

## 2012-09-03 ENCOUNTER — Other Ambulatory Visit: Payer: Self-pay | Admitting: Family Medicine

## 2012-09-03 MED ORDER — POTASSIUM CHLORIDE CRYS ER 20 MEQ PO TBCR: 20.0000 meq | EXTENDED_RELEASE_TABLET | Freq: Every day | ORAL | Status: DC

## 2012-09-03 NOTE — Telephone Encounter (Signed)
Notes Recorded by Lelon Perla, DO on 08/28/2012 at 11:06 PM [facility] ---- kcl 20 meq 1 po qd #30 2 refills--- recheck 2 weeks Rest of labs are normal

## 2012-09-03 NOTE — Telephone Encounter (Signed)
Discussed with patient and she voiced understanding.        Kp

## 2012-09-17 ENCOUNTER — Other Ambulatory Visit (INDEPENDENT_AMBULATORY_CARE_PROVIDER_SITE_OTHER): Payer: Medicare Other

## 2012-09-17 DIAGNOSIS — E876 Hypokalemia: Secondary | ICD-10-CM

## 2012-09-17 LAB — BASIC METABOLIC PANEL
BUN: 24 mg/dL — ABNORMAL HIGH (ref 6–23)
Chloride: 104 mEq/L (ref 96–112)
Potassium: 3.4 mEq/L — ABNORMAL LOW (ref 3.5–5.1)
Sodium: 139 mEq/L (ref 135–145)

## 2012-09-29 ENCOUNTER — Other Ambulatory Visit: Payer: Self-pay

## 2012-09-29 DIAGNOSIS — Z1231 Encounter for screening mammogram for malignant neoplasm of breast: Secondary | ICD-10-CM

## 2012-10-01 ENCOUNTER — Other Ambulatory Visit: Payer: Self-pay | Admitting: Family Medicine

## 2012-10-12 ENCOUNTER — Other Ambulatory Visit: Payer: Self-pay | Admitting: Family Medicine

## 2012-10-20 ENCOUNTER — Ambulatory Visit: Payer: 59

## 2012-10-29 ENCOUNTER — Ambulatory Visit
Admission: RE | Admit: 2012-10-29 | Discharge: 2012-10-29 | Disposition: A | Discharge status: Home / Self Care | Payer: Medicare Other | Source: Ambulatory Visit

## 2012-10-29 DIAGNOSIS — Z1231 Encounter for screening mammogram for malignant neoplasm of breast: Secondary | ICD-10-CM

## 2012-11-24 ENCOUNTER — Other Ambulatory Visit: Payer: Self-pay | Admitting: Family Medicine

## 2012-11-24 NOTE — Telephone Encounter (Signed)
Last seen 08/26/12 and filled 09/08/11 #30 with 5 refills. Please advise     KP

## 2012-12-06 ENCOUNTER — Other Ambulatory Visit: Payer: Self-pay | Admitting: Family Medicine

## 2013-02-11 ENCOUNTER — Other Ambulatory Visit: Payer: Self-pay | Admitting: Family Medicine

## 2013-03-29 ENCOUNTER — Other Ambulatory Visit: Payer: Medicare Other

## 2013-04-12 ENCOUNTER — Other Ambulatory Visit (INDEPENDENT_AMBULATORY_CARE_PROVIDER_SITE_OTHER): Payer: Medicare Other

## 2013-04-12 DIAGNOSIS — I10 Essential (primary) hypertension: Secondary | ICD-10-CM

## 2013-04-12 DIAGNOSIS — E785 Hyperlipidemia, unspecified: Secondary | ICD-10-CM

## 2013-04-12 LAB — BASIC METABOLIC PANEL
BUN: 21 mg/dL (ref 6–23)
CHLORIDE: 103 meq/L (ref 96–112)
CO2: 26 mEq/L (ref 19–32)
CREATININE: 0.9 mg/dL (ref 0.4–1.2)
Calcium: 9.4 mg/dL (ref 8.4–10.5)
GFR: 69.7 mL/min (ref >60.00)
Glucose, Bld: 92 mg/dL (ref 70–99)
POTASSIUM: 3.3 meq/L — AB (ref 3.5–5.1)
SODIUM: 139 meq/L (ref 135–145)

## 2013-04-12 LAB — LIPID PANEL
CHOLESTEROL: 144 mg/dL (ref 0–200)
HDL: 71.3 mg/dL (ref >39.00)
LDL CALC: 62 mg/dL (ref 0–99)
TRIGLYCERIDES: 56 mg/dL (ref 0.0–149.0)
Total CHOL/HDL Ratio: 2
VLDL: 11.2 mg/dL (ref 0.0–40.0)

## 2013-04-12 LAB — HEPATIC FUNCTION PANEL
ALBUMIN: 4.2 g/dL (ref 3.5–5.2)
ALK PHOS: 50 U/L (ref 39–117)
ALT: 22 U/L (ref 0–35)
AST: 21 U/L (ref 0–37)
Bilirubin, Direct: 0 mg/dL (ref 0.0–0.3)
TOTAL PROTEIN: 7.2 g/dL (ref 6.0–8.3)
Total Bilirubin: 0.8 mg/dL (ref 0.3–1.2)

## 2013-04-19 ENCOUNTER — Other Ambulatory Visit: Payer: Self-pay | Admitting: Family Medicine

## 2013-04-19 MED ORDER — POTASSIUM CHLORIDE CRYS ER 20 MEQ PO TBCR: EXTENDED_RELEASE_TABLET | ORAL | Status: DC

## 2013-04-21 ENCOUNTER — Encounter: Payer: Self-pay | Admitting: Internal Medicine

## 2013-04-27 ENCOUNTER — Ambulatory Visit (INDEPENDENT_AMBULATORY_CARE_PROVIDER_SITE_OTHER)
Admission: RE | Admit: 2013-04-27 | Discharge: 2013-04-27 | Disposition: A | Discharge status: Home / Self Care | Payer: Medicare Other | Source: Ambulatory Visit | Attending: Family Medicine | Admitting: Family Medicine

## 2013-04-27 DIAGNOSIS — Z780 Asymptomatic menopausal state: Secondary | ICD-10-CM

## 2013-05-18 ENCOUNTER — Other Ambulatory Visit: Payer: Self-pay | Admitting: Family Medicine

## 2013-05-31 ENCOUNTER — Telehealth: Payer: Self-pay

## 2013-05-31 NOTE — Telephone Encounter (Signed)
Called pt regarding her pre-visit appt on 06/01/13 to discuss her medical information prior to her colonoscopy on 06/15/13 with Dr  Juanda ChanceBrodie. Per our records, the pt did not need another colonoscopy until 08/22/2014.We were unable to pull up any reports from [place], but the pt states she had a colonoscopy with Dr Kinnie ScalesMedoff in 2006 and she has a history of colon polyps.She felt she needed to have a colonoscopy done now.Informed pt to keep her pre-visit appt for tomorrow and her colonoscopy is scheduled with Dr Juanda ChanceBrodie on 06/15/13. We would be reviewing all her medical information and instructions at her pre-visit appt.The pt understood.

## 2013-06-01 ENCOUNTER — Ambulatory Visit (AMBULATORY_SURGERY_CENTER): Payer: Self-pay | Admitting: *Deleted

## 2013-06-01 VITALS — Ht 65.0 in | Wt 172.0 lb

## 2013-06-01 DIAGNOSIS — Z86010 Personal history of colonic polyps: Secondary | ICD-10-CM

## 2013-06-01 MED ORDER — MOVIPREP 100 G PO SOLR: 1.0000 | Freq: Once | ORAL | Status: DC

## 2013-06-01 NOTE — Progress Notes (Signed)
No egg or soy allergy. No anesthesia problems.  No home O2.  No diet meds.  

## 2013-06-03 ENCOUNTER — Encounter: Payer: Self-pay | Admitting: Internal Medicine

## 2013-06-10 ENCOUNTER — Other Ambulatory Visit: Payer: Self-pay | Admitting: Family Medicine

## 2013-06-15 ENCOUNTER — Ambulatory Visit (AMBULATORY_SURGERY_CENTER): Payer: Medicare Other | Admitting: Internal Medicine

## 2013-06-15 ENCOUNTER — Encounter: Payer: Self-pay | Admitting: Internal Medicine

## 2013-06-15 VITALS — BP 162/91 | HR 57 | Temp 96.3°F | Resp 13 | Ht 65.0 in | Wt 172.0 lb

## 2013-06-15 DIAGNOSIS — Z1211 Encounter for screening for malignant neoplasm of colon: Secondary | ICD-10-CM

## 2013-06-15 DIAGNOSIS — Z86010 Personal history of colonic polyps: Secondary | ICD-10-CM

## 2013-06-15 MED ORDER — SODIUM CHLORIDE 0.9 % IV SOLN: 500.0000 mL | INTRAVENOUS | Status: DC

## 2013-06-15 NOTE — Op Note (Signed)
[facility] [address] [place], [id number]   COLONOSCOPY PROCEDURE REPORT  PATIENT: Glenda Lyons, Glenda M.  MR#: [id number] BIRTHDATE: 1945/06/22 , 67  yrs. old GENDER: Female ENDOSCOPIST: Hart Carwinora M Brodie, MD REFERRED IO:NGEXBMBY:Yvonne Lowne, DO PROCEDURE DATE:  06/15/2013 PROCEDURE:   Colonoscopy, screening First Screening Colonoscopy - Avg.  risk and is 50 yrs.  old or older - No.  Prior Negative Screening - Now for repeat screening. N/A  History of Adenoma - Now for follow-up colonoscopy & has been > or = to 3 yrs.  N/A  Polyps Removed Today? No.  Recommend repeat exam, <10 yrs? No. ASA CLASS:   Class II INDICATIONS:Average risk patient for colon cancer and last colonoscopy in 2006 in another facility reportedly polyps removed.  MEDICATIONS: MAC sedation, administered by CRNA and Propofol (Diprivan) 180 mg IV  DESCRIPTION OF PROCEDURE:   After the risks benefits and alternatives of the procedure were thoroughly explained, informed consent was obtained.  A digital rectal exam revealed no abnormalities of the rectum.   The [facility] PFC-H190 [id number]  endoscope was introduced through the anus and advanced to the cecum, which was identified by both the appendix and ileocecal valve. No adverse events experienced.   The quality of the prep was good, using MoviPrep  The instrument was then slowly withdrawn as the colon was fully examined.      COLON FINDINGS: A normal appearing cecum, ileocecal valve, and appendiceal orifice were identified.  The ascending, hepatic flexure, transverse, splenic flexure, descending, sigmoid colon and rectum appeared unremarkable.  No polyps or cancers were seen. Retroflexed views revealed no abnormalities. The time to cecum=6 minutes 05 seconds.  Withdrawal time=6 minutes 10 seconds.  The scope was withdrawn and the procedure completed. COMPLICATIONS: There were no complications.  ENDOSCOPIC IMPRESSION: Normal colon  RECOMMENDATIONS: high  fiber diet Recall colonoscopy in 10 years   eSigned:  Hart Carwinora M Brodie, MD 06/15/2013 8:55 AM   cc:

## 2013-06-15 NOTE — Patient Instructions (Signed)
YOU HAD AN ENDOSCOPIC PROCEDURE TODAY AT THE [facility]: Refer to the procedure report that was given to you for any specific questions about what was found during the examination.  If the procedure report does not answer your questions, please call your gastroenterologist to clarify.  If you requested that your care partner not be given the details of your procedure findings, then the procedure report has been included in a sealed envelope for you to review at your convenience later.  YOU SHOULD EXPECT: Some feelings of bloating in the abdomen. Passage of more gas than usual.  Walking can help get rid of the air that was put into your GI tract during the procedure and reduce the bloating. If you had a lower endoscopy (such as a colonoscopy or flexible sigmoidoscopy) you may notice spotting of blood in your stool or on the toilet paper. If you underwent a bowel prep for your procedure, then you may not have a normal bowel movement for a few days.  DIET: Your first meal following the procedure should be a light meal and then it is ok to progress to your normal diet.  A half-sandwich or bowl of soup is an example of a good first meal.  Heavy or fried foods are harder to digest and may make you feel nauseous or bloated.  Likewise meals heavy in dairy and vegetables can cause extra gas to form and this can also increase the bloating.  Drink plenty of fluids but you should avoid alcoholic beverages for 24 hours.  ACTIVITY: Your care partner should take you home directly after the procedure.  You should plan to take it easy, moving slowly for the rest of the day.  You can resume normal activity the day after the procedure however you should NOT DRIVE or use heavy machinery for 24 hours (because of the sedation medicines used during the test).    SYMPTOMS TO REPORT IMMEDIATELY: A gastroenterologist can be reached at any hour.  During normal business hours, 8:30 AM to 5:00 PM Monday through Friday,  call [phone].  After hours and on weekends, please call the GI answering service at [phone] who will take a message and have the physician on call contact you.   Following lower endoscopy (colonoscopy or flexible sigmoidoscopy):  Excessive amounts of blood in the stool  Significant tenderness or worsening of abdominal pains  Swelling of the abdomen that is new, acute  Fever of 100F or higher   FOLLOW UP: If any biopsies were taken you will be contacted by phone or by letter within the next 1-3 weeks.  Call your gastroenterologist if you have not heard about the biopsies in 3 weeks.  Our staff will call the home number listed on your records the next business day following your procedure to check on you and address any questions or concerns that you may have at that time regarding the information given to you following your procedure. This is a courtesy call and so if there is no answer at the home number and we have not heard from you through the emergency physician on call, we will assume that you have returned to your regular daily activities without incident.  SIGNATURES/CONFIDENTIALITY: You and/or your care partner have signed paperwork which will be entered into your electronic medical record.  These signatures attest to the fact that that the information above on your After Visit Summary has been reviewed and is understood.  Full responsibility of the confidentiality of   this discharge information lies with you and/or your care-partner.  Normal exam.  Repeat colonoscopy in 10 years 2025.  

## 2013-06-15 NOTE — Progress Notes (Signed)
A/ox3 pleased with MAC, report to April RN 

## 2013-06-16 ENCOUNTER — Telehealth: Payer: Self-pay

## 2013-06-16 NOTE — Telephone Encounter (Signed)
Left a message on the pt's answering machine to call us back if any questions or concerns.  ID was on the recorder for Glenda AlimentMarie Lyons # [phone]. [email or web address]

## 2013-07-01 ENCOUNTER — Other Ambulatory Visit: Payer: Self-pay | Admitting: Family Medicine

## 2013-07-08 ENCOUNTER — Other Ambulatory Visit: Payer: Self-pay | Admitting: Family Medicine

## 2013-08-20 DIAGNOSIS — L574 Cutis laxa senilis: Secondary | ICD-10-CM

## 2013-08-20 HISTORY — DX: Cutis laxa senilis: L57.4

## 2013-09-08 ENCOUNTER — Other Ambulatory Visit: Payer: Self-pay | Admitting: Family Medicine

## 2013-09-21 ENCOUNTER — Other Ambulatory Visit: Payer: Self-pay

## 2013-09-21 DIAGNOSIS — Z1231 Encounter for screening mammogram for malignant neoplasm of breast: Secondary | ICD-10-CM

## 2013-09-22 ENCOUNTER — Other Ambulatory Visit (HOSPITAL_COMMUNITY)
Admission: RE | Admit: 2013-09-22 | Discharge: 2013-09-22 | Disposition: A | Discharge status: Home / Self Care | Payer: Medicare Other | Source: Ambulatory Visit | Attending: Family Medicine | Admitting: Family Medicine

## 2013-09-22 ENCOUNTER — Ambulatory Visit (INDEPENDENT_AMBULATORY_CARE_PROVIDER_SITE_OTHER): Payer: Medicare Other | Admitting: Family Medicine

## 2013-09-22 ENCOUNTER — Encounter: Payer: Self-pay | Admitting: Family Medicine

## 2013-09-22 VITALS — BP 134/82 | HR 77 | Temp 98.1°F | Ht 64.5 in | Wt 166.0 lb

## 2013-09-22 DIAGNOSIS — Z23 Encounter for immunization: Secondary | ICD-10-CM

## 2013-09-22 DIAGNOSIS — Z0000 Encounter for general adult medical examination without abnormal findings: Secondary | ICD-10-CM

## 2013-09-22 DIAGNOSIS — Z124 Encounter for screening for malignant neoplasm of cervix: Secondary | ICD-10-CM

## 2013-09-22 DIAGNOSIS — F3289 Other specified depressive episodes: Secondary | ICD-10-CM

## 2013-09-22 DIAGNOSIS — F329 Major depressive disorder, single episode, unspecified: Secondary | ICD-10-CM

## 2013-09-22 DIAGNOSIS — E785 Hyperlipidemia, unspecified: Secondary | ICD-10-CM

## 2013-09-22 DIAGNOSIS — F32A Depression, unspecified: Secondary | ICD-10-CM

## 2013-09-22 DIAGNOSIS — R8290 Unspecified abnormal findings in urine: Secondary | ICD-10-CM

## 2013-09-22 DIAGNOSIS — I10 Essential (primary) hypertension: Secondary | ICD-10-CM

## 2013-09-22 LAB — POCT URINALYSIS DIPSTICK
BILIRUBIN UA: NEGATIVE
Glucose, UA: NEGATIVE
Ketones, UA: NEGATIVE
Nitrite, UA: NEGATIVE
Protein, UA: NEGATIVE
SPEC GRAV UA: 1.025
UROBILINOGEN UA: 0.2
pH, UA: 6

## 2013-09-22 MED ORDER — VALSARTAN-HYDROCHLOROTHIAZIDE 80-12.5 MG PO TABS: ORAL_TABLET | ORAL | Status: DC

## 2013-09-22 MED ORDER — SERTRALINE HCL 100 MG PO TABS: ORAL_TABLET | ORAL | Status: DC

## 2013-09-22 NOTE — Progress Notes (Signed)
Pre visit review using our clinic review tool, if applicable. No additional management support is needed unless otherwise documented below in the visit note. 

## 2013-09-22 NOTE — Patient Instructions (Signed)
Preventive Care for Adults A healthy lifestyle and preventive care can promote health and wellness. Preventive health guidelines for women include the following key practices.  A routine yearly physical is a good way to check with your health care provider about your health and preventive screening. It is a chance to share any concerns and updates on your health and to receive a thorough exam.  Visit your dentist for a routine exam and preventive care every 6 months. Brush your teeth twice a day and floss once a day. Good oral hygiene prevents tooth decay and gum disease.  The frequency of eye exams is based on your age, health, family medical history, use of contact lenses, and other factors. Follow your health care provider's recommendations for frequency of eye exams.  Eat a healthy diet. Foods like vegetables, fruits, whole grains, low-fat dairy products, and lean protein foods contain the nutrients you need without too many calories. Decrease your intake of foods high in solid fats, added sugars, and salt. Eat the right amount of calories for you.Get information about a proper diet from your health care provider, if necessary.  Regular physical exercise is one of the most important things you can do for your health. Most adults should get at least 150 minutes of moderate-intensity exercise (any activity that increases your heart rate and causes you to sweat) each week. In addition, most adults need muscle-strengthening exercises on 2 or more days a week.  Maintain a healthy weight. The body mass index (BMI) is a screening tool to identify possible weight problems. It provides an estimate of body fat based on height and weight. Your health care provider can find your BMI and can help you achieve or maintain a healthy weight.For adults 20 years and older:  A BMI below 18.5 is considered underweight.  A BMI of 18.5 to 24.9 is normal.  A BMI of 25 to 29.9 is considered overweight.  A BMI of  30 and above is considered obese.  Maintain normal blood lipids and cholesterol levels by exercising and minimizing your intake of saturated fat. Eat a balanced diet with plenty of fruit and vegetables. Blood tests for lipids and cholesterol should begin at age 76 and be repeated every 5 years. If your lipid or cholesterol levels are high, you are over 50, or you are at high risk for heart disease, you may need your cholesterol levels checked more frequently.Ongoing high lipid and cholesterol levels should be treated with medicines if diet and exercise are not working.  If you smoke, find out from your health care provider how to quit. If you do not use tobacco, do not start.  Lung cancer screening is recommended for adults aged 22-80 years who are at high risk for developing lung cancer because of a history of smoking. A yearly low-dose CT scan of the lungs is recommended for people who have at least a 30-pack-year history of smoking and are a current smoker or have quit within the past 15 years. A pack year of smoking is smoking an average of 1 pack of cigarettes a day for 1 year (for example: 1 pack a day for 30 years or 2 packs a day for 15 years). Yearly screening should continue until the smoker has stopped smoking for at least 15 years. Yearly screening should be stopped for people who develop a health problem that would prevent them from having lung cancer treatment.  If you are pregnant, do not drink alcohol. If you are breastfeeding,  be very cautious about drinking alcohol. If you are not pregnant and choose to drink alcohol, do not have more than 1 drink per day. One drink is considered to be 12 ounces (355 mL) of beer, 5 ounces (148 mL) of wine, or 1.5 ounces (44 mL) of liquor.  Avoid use of street drugs. Do not share needles with anyone. Ask for help if you need support or instructions about stopping the use of drugs.  High blood pressure causes heart disease and increases the risk of  stroke. Your blood pressure should be checked at least every 1 to 2 years. Ongoing high blood pressure should be treated with medicines if weight loss and exercise do not work.  If you are 75-52 years old, ask your health care provider if you should take aspirin to prevent strokes.  Diabetes screening involves taking a blood sample to check your fasting blood sugar level. This should be done once every 3 years, after age 15, if you are within normal weight and without risk factors for diabetes. Testing should be considered at a younger age or be carried out more frequently if you are overweight and have at least 1 risk factor for diabetes.  Breast cancer screening is essential preventive care for women. You should practice "breast self-awareness." This means understanding the normal appearance and feel of your breasts and may include breast self-examination. Any changes detected, no matter how small, should be reported to a health care provider. Women in their 58s and 30s should have a clinical breast exam (CBE) by a health care provider as part of a regular health exam every 1 to 3 years. After age 16, women should have a CBE every year. Starting at age 53, women should consider having a mammogram (breast X-ray test) every year. Women who have a family history of breast cancer should talk to their health care provider about genetic screening. Women at a high risk of breast cancer should talk to their health care providers about having an MRI and a mammogram every year.  Breast cancer gene (BRCA)-related cancer risk assessment is recommended for women who have family members with BRCA-related cancers. BRCA-related cancers include breast, ovarian, tubal, and peritoneal cancers. Having family members with these cancers may be associated with an increased risk for harmful changes (mutations) in the breast cancer genes BRCA1 and BRCA2. Results of the assessment will determine the need for genetic counseling and  BRCA1 and BRCA2 testing.  Routine pelvic exams to screen for cancer are no longer recommended for nonpregnant women who are considered low risk for cancer of the pelvic organs (ovaries, uterus, and vagina) and who do not have symptoms. Ask your health care provider if a screening pelvic exam is right for you.  If you have had past treatment for cervical cancer or a condition that could lead to cancer, you need Pap tests and screening for cancer for at least 20 years after your treatment. If Pap tests have been discontinued, your risk factors (such as having a new sexual partner) need to be reassessed to determine if screening should be resumed. Some women have medical problems that increase the chance of getting cervical cancer. In these cases, your health care provider may recommend more frequent screening and Pap tests.  The HPV test is an additional test that may be used for cervical cancer screening. The HPV test looks for the virus that can cause the cell changes on the cervix. The cells collected during the Pap test can be  tested for HPV. The HPV test could be used to screen women aged 30 years and older, and should be used in women of any age who have unclear Pap test results. After the age of 30, women should have HPV testing at the same frequency as a Pap test.  Colorectal cancer can be detected and often prevented. Most routine colorectal cancer screening begins at the age of 50 years and continues through age 75 years. However, your health care provider may recommend screening at an earlier age if you have risk factors for colon cancer. On a yearly basis, your health care provider may provide home test kits to check for hidden blood in the stool. Use of a small camera at the end of a tube, to directly examine the colon (sigmoidoscopy or colonoscopy), can detect the earliest forms of colorectal cancer. Talk to your health care provider about this at age 50, when routine screening begins. Direct  exam of the colon should be repeated every 5-10 years through age 75 years, unless early forms of pre-cancerous polyps or small growths are found.  People who are at an increased risk for hepatitis B should be screened for this virus. You are considered at high risk for hepatitis B if:  You were born in a country where hepatitis B occurs often. Talk with your health care provider about which countries are considered high risk.  Your parents were born in a high-risk country and you have not received a shot to protect against hepatitis B (hepatitis B vaccine).  You have HIV or AIDS.  You use needles to inject street drugs.  You live with, or have sex with, someone who has hepatitis B.  You get hemodialysis treatment.  You take certain medicines for conditions like cancer, organ transplantation, and autoimmune conditions.  Hepatitis C blood testing is recommended for all people born from 1945 through 1965 and any individual with known risks for hepatitis C.  Practice safe sex. Use condoms and avoid high-risk sexual practices to reduce the spread of sexually transmitted infections (STIs). STIs include gonorrhea, chlamydia, syphilis, trichomonas, herpes, HPV, and human immunodeficiency virus (HIV). Herpes, HIV, and HPV are viral illnesses that have no cure. They can result in disability, cancer, and death.  You should be screened for sexually transmitted illnesses (STIs) including gonorrhea and chlamydia if:  You are sexually active and are younger than 24 years.  You are older than 24 years and your health care provider tells you that you are at risk for this type of infection.  Your sexual activity has changed since you were last screened and you are at an increased risk for chlamydia or gonorrhea. Ask your health care provider if you are at risk.  If you are at risk of being infected with HIV, it is recommended that you take a prescription medicine daily to prevent HIV infection. This is  called preexposure prophylaxis (PrEP). You are considered at risk if:  You are a heterosexual woman, are sexually active, and are at increased risk for HIV infection.  You take drugs by injection.  You are sexually active with a partner who has HIV.  Talk with your health care provider about whether you are at high risk of being infected with HIV. If you choose to begin PrEP, you should first be tested for HIV. You should then be tested every 3 months for as long as you are taking PrEP.  Osteoporosis is a disease in which the bones lose minerals and strength   with aging. This can result in serious bone fractures or breaks. The risk of osteoporosis can be identified using a bone density scan. Women ages 65 years and over and women at risk for fractures or osteoporosis should discuss screening with their health care providers. Ask your health care provider whether you should take a calcium supplement or vitamin D to reduce the rate of osteoporosis.  Menopause can be associated with physical symptoms and risks. Hormone replacement therapy is available to decrease symptoms and risks. You should talk to your health care provider about whether hormone replacement therapy is right for you.  Use sunscreen. Apply sunscreen liberally and repeatedly throughout the day. You should seek shade when your shadow is shorter than you. Protect yourself by wearing long sleeves, pants, a wide-brimmed hat, and sunglasses year round, whenever you are outdoors.  Once a month, do a whole body skin exam, using a mirror to look at the skin on your back. Tell your health care provider of new moles, moles that have irregular borders, moles that are larger than a pencil eraser, or moles that have changed in shape or color.  Stay current with required vaccines (immunizations).  Influenza vaccine. All adults should be immunized every year.  Tetanus, diphtheria, and acellular pertussis (Td, Tdap) vaccine. Pregnant women should  receive 1 dose of Tdap vaccine during each pregnancy. The dose should be obtained regardless of the length of time since the last dose. Immunization is preferred during the 27th-36th week of gestation. An adult who has not previously received Tdap or who does not know her vaccine status should receive 1 dose of Tdap. This initial dose should be followed by tetanus and diphtheria toxoids (Td) booster doses every 10 years. Adults with an unknown or incomplete history of completing a 3-dose immunization series with Td-containing vaccines should begin or complete a primary immunization series including a Tdap dose. Adults should receive a Td booster every 10 years.  Varicella vaccine. An adult without evidence of immunity to varicella should receive 2 doses or a second dose if she has previously received 1 dose. Pregnant females who do not have evidence of immunity should receive the first dose after pregnancy. This first dose should be obtained before leaving the health care facility. The second dose should be obtained 4-8 weeks after the first dose.  Human papillomavirus (HPV) vaccine. Females aged 13-26 years who have not received the vaccine previously should obtain the 3-dose series. The vaccine is not recommended for use in pregnant females. However, pregnancy testing is not needed before receiving a dose. If a female is found to be pregnant after receiving a dose, no treatment is needed. In that case, the remaining doses should be delayed until after the pregnancy. Immunization is recommended for any person with an immunocompromised condition through the age of 26 years if she did not get any or all doses earlier. During the 3-dose series, the second dose should be obtained 4-8 weeks after the first dose. The third dose should be obtained 24 weeks after the first dose and 16 weeks after the second dose.  Zoster vaccine. One dose is recommended for adults aged 60 years or older unless certain conditions are  present.  Measles, mumps, and rubella (MMR) vaccine. Adults born before 1957 generally are considered immune to measles and mumps. Adults born in 1957 or later should have 1 or more doses of MMR vaccine unless there is a contraindication to the vaccine or there is laboratory evidence of immunity to   each of the three diseases. A routine second dose of MMR vaccine should be obtained at least 28 days after the first dose for students attending postsecondary schools, health care workers, or international travelers. People who received inactivated measles vaccine or an unknown type of measles vaccine during 1963-1967 should receive 2 doses of MMR vaccine. People who received inactivated mumps vaccine or an unknown type of mumps vaccine before 1979 and are at high risk for mumps infection should consider immunization with 2 doses of MMR vaccine. For females of childbearing age, rubella immunity should be determined. If there is no evidence of immunity, females who are not pregnant should be vaccinated. If there is no evidence of immunity, females who are pregnant should delay immunization until after pregnancy. Unvaccinated health care workers born before 1957 who lack laboratory evidence of measles, mumps, or rubella immunity or laboratory confirmation of disease should consider measles and mumps immunization with 2 doses of MMR vaccine or rubella immunization with 1 dose of MMR vaccine.  Pneumococcal 13-valent conjugate (PCV13) vaccine. When indicated, a person who is uncertain of her immunization history and has no record of immunization should receive the PCV13 vaccine. An adult aged 19 years or older who has certain medical conditions and has not been previously immunized should receive 1 dose of PCV13 vaccine. This PCV13 should be followed with a dose of pneumococcal polysaccharide (PPSV23) vaccine. The PPSV23 vaccine dose should be obtained at least 8 weeks after the dose of PCV13 vaccine. An adult aged 19  years or older who has certain medical conditions and previously received 1 or more doses of PPSV23 vaccine should receive 1 dose of PCV13. The PCV13 vaccine dose should be obtained 1 or more years after the last PPSV23 vaccine dose.  Pneumococcal polysaccharide (PPSV23) vaccine. When PCV13 is also indicated, PCV13 should be obtained first. All adults aged 65 years and older should be immunized. An adult younger than age 65 years who has certain medical conditions should be immunized. Any person who resides in a nursing home or long-term care facility should be immunized. An adult smoker should be immunized. People with an immunocompromised condition and certain other conditions should receive both PCV13 and PPSV23 vaccines. People with human immunodeficiency virus (HIV) infection should be immunized as soon as possible after diagnosis. Immunization during chemotherapy or radiation therapy should be avoided. Routine use of PPSV23 vaccine is not recommended for American Indians, [place] Natives, or people younger than 65 years unless there are medical conditions that require PPSV23 vaccine. When indicated, people who have unknown immunization and have no record of immunization should receive PPSV23 vaccine. One-time revaccination 5 years after the first dose of PPSV23 is recommended for people aged 19-64 years who have chronic kidney failure, nephrotic syndrome, asplenia, or immunocompromised conditions. People who received 1-2 doses of PPSV23 before age 65 years should receive another dose of PPSV23 vaccine at age 65 years or later if at least 5 years have passed since the previous dose. Doses of PPSV23 are not needed for people immunized with PPSV23 at or after age 65 years.  Meningococcal vaccine. Adults with asplenia or persistent complement component deficiencies should receive 2 doses of quadrivalent meningococcal conjugate (MenACWY-D) vaccine. The doses should be obtained at least 2 months apart.  Microbiologists working with certain meningococcal bacteria, military recruits, people at risk during an outbreak, and people who travel to or live in countries with a high rate of meningitis should be immunized. A first-year college student up through age   21 years who is living in a residence hall should receive a dose if she did not receive a dose on or after her 16th birthday. Adults who have certain high-risk conditions should receive one or more doses of vaccine.  Hepatitis A vaccine. Adults who wish to be protected from this disease, have certain high-risk conditions, work with hepatitis A-infected animals, work in hepatitis A research labs, or travel to or work in countries with a high rate of hepatitis A should be immunized. Adults who were previously unvaccinated and who anticipate close contact with an international adoptee during the first 60 days after arrival in the [place] from a country with a high rate of hepatitis A should be immunized.  Hepatitis B vaccine. Adults who wish to be protected from this disease, have certain high-risk conditions, may be exposed to blood or other infectious body fluids, are household contacts or sex partners of hepatitis B positive people, are clients or workers in certain care facilities, or travel to or work in countries with a high rate of hepatitis B should be immunized.  Haemophilus influenzae type b (Hib) vaccine. A previously unvaccinated person with asplenia or sickle cell disease or having a scheduled splenectomy should receive 1 dose of Hib vaccine. Regardless of previous immunization, a recipient of a hematopoietic stem cell transplant should receive a 3-dose series 6-12 months after her successful transplant. Hib vaccine is not recommended for adults with HIV infection. Preventive Services / Frequency Ages 64 to 68 years  Blood pressure check.** / Every 1 to 2 years.  Lipid and cholesterol check.** / Every 5 years beginning at age  22.  Clinical breast exam.** / Every 3 years for women in their 88s and 53s.  BRCA-related cancer risk assessment.** / For women who have family members with a BRCA-related cancer (breast, ovarian, tubal, or peritoneal cancers).  Pap test.** / Every 2 years from ages 90 through 51. Every 3 years starting at age 21 through age 56 or 3 with a history of 3 consecutive normal Pap tests.  HPV screening.** / Every 3 years from ages 24 through ages 1 to 46 with a history of 3 consecutive normal Pap tests.  Hepatitis C blood test.** / For any individual with known risks for hepatitis C.  Skin self-exam. / Monthly.  Influenza vaccine. / Every year.  Tetanus, diphtheria, and acellular pertussis (Tdap, Td) vaccine.** / Consult your health care provider. Pregnant women should receive 1 dose of Tdap vaccine during each pregnancy. 1 dose of Td every 10 years.  Varicella vaccine.** / Consult your health care provider. Pregnant females who do not have evidence of immunity should receive the first dose after pregnancy.  HPV vaccine. / 3 doses over 6 months, if 72 and younger. The vaccine is not recommended for use in pregnant females. However, pregnancy testing is not needed before receiving a dose.  Measles, mumps, rubella (MMR) vaccine.** / You need at least 1 dose of MMR if you were born in 1957 or later. You may also need a 2nd dose. For females of childbearing age, rubella immunity should be determined. If there is no evidence of immunity, females who are not pregnant should be vaccinated. If there is no evidence of immunity, females who are pregnant should delay immunization until after pregnancy.  Pneumococcal 13-valent conjugate (PCV13) vaccine.** / Consult your health care provider.  Pneumococcal polysaccharide (PPSV23) vaccine.** / 1 to 2 doses if you smoke cigarettes or if you have certain conditions.  Meningococcal vaccine.** /  1 dose if you are age 19 to 21 years and a first-year college  student living in a residence hall, or have one of several medical conditions, you need to get vaccinated against meningococcal disease. You may also need additional booster doses.  Hepatitis A vaccine.** / Consult your health care provider.  Hepatitis B vaccine.** / Consult your health care provider.  Haemophilus influenzae type b (Hib) vaccine.** / Consult your health care provider. Ages 40 to 64 years  Blood pressure check.** / Every 1 to 2 years.  Lipid and cholesterol check.** / Every 5 years beginning at age 20 years.  Lung cancer screening. / Every year if you are aged 55-80 years and have a 30-pack-year history of smoking and currently smoke or have quit within the past 15 years. Yearly screening is stopped once you have quit smoking for at least 15 years or develop a health problem that would prevent you from having lung cancer treatment.  Clinical breast exam.** / Every year after age 40 years.  BRCA-related cancer risk assessment.** / For women who have family members with a BRCA-related cancer (breast, ovarian, tubal, or peritoneal cancers).  Mammogram.** / Every year beginning at age 40 years and continuing for as long as you are in good health. Consult with your health care provider.  Pap test.** / Every 3 years starting at age 30 years through age 65 or 70 years with a history of 3 consecutive normal Pap tests.  HPV screening.** / Every 3 years from ages 30 years through ages 65 to 70 years with a history of 3 consecutive normal Pap tests.  Fecal occult blood test (FOBT) of stool. / Every year beginning at age 50 years and continuing until age 75 years. You may not need to do this test if you get a colonoscopy every 10 years.  Flexible sigmoidoscopy or colonoscopy.** / Every 5 years for a flexible sigmoidoscopy or every 10 years for a colonoscopy beginning at age 50 years and continuing until age 75 years.  Hepatitis C blood test.** / For all people born from 1945 through  1965 and any individual with known risks for hepatitis C.  Skin self-exam. / Monthly.  Influenza vaccine. / Every year.  Tetanus, diphtheria, and acellular pertussis (Tdap/Td) vaccine.** / Consult your health care provider. Pregnant women should receive 1 dose of Tdap vaccine during each pregnancy. 1 dose of Td every 10 years.  Varicella vaccine.** / Consult your health care provider. Pregnant females who do not have evidence of immunity should receive the first dose after pregnancy.  Zoster vaccine.** / 1 dose for adults aged 60 years or older.  Measles, mumps, rubella (MMR) vaccine.** / You need at least 1 dose of MMR if you were born in 1957 or later. You may also need a 2nd dose. For females of childbearing age, rubella immunity should be determined. If there is no evidence of immunity, females who are not pregnant should be vaccinated. If there is no evidence of immunity, females who are pregnant should delay immunization until after pregnancy.  Pneumococcal 13-valent conjugate (PCV13) vaccine.** / Consult your health care provider.  Pneumococcal polysaccharide (PPSV23) vaccine.** / 1 to 2 doses if you smoke cigarettes or if you have certain conditions.  Meningococcal vaccine.** / Consult your health care provider.  Hepatitis A vaccine.** / Consult your health care provider.  Hepatitis B vaccine.** / Consult your health care provider.  Haemophilus influenzae type b (Hib) vaccine.** / Consult your health care provider. Ages 65   years and over  Blood pressure check.** / Every 1 to 2 years.  Lipid and cholesterol check.** / Every 5 years beginning at age 22 years.  Lung cancer screening. / Every year if you are aged 73-80 years and have a 30-pack-year history of smoking and currently smoke or have quit within the past 15 years. Yearly screening is stopped once you have quit smoking for at least 15 years or develop a health problem that would prevent you from having lung cancer  treatment.  Clinical breast exam.** / Every year after age 4 years.  BRCA-related cancer risk assessment.** / For women who have family members with a BRCA-related cancer (breast, ovarian, tubal, or peritoneal cancers).  Mammogram.** / Every year beginning at age 40 years and continuing for as long as you are in good health. Consult with your health care provider.  Pap test.** / Every 3 years starting at age 9 years through age 34 or 91 years with 3 consecutive normal Pap tests. Testing can be stopped between 65 and 70 years with 3 consecutive normal Pap tests and no abnormal Pap or HPV tests in the past 10 years.  HPV screening.** / Every 3 years from ages 57 years through ages 64 or 45 years with a history of 3 consecutive normal Pap tests. Testing can be stopped between 65 and 70 years with 3 consecutive normal Pap tests and no abnormal Pap or HPV tests in the past 10 years.  Fecal occult blood test (FOBT) of stool. / Every year beginning at age 15 years and continuing until age 17 years. You may not need to do this test if you get a colonoscopy every 10 years.  Flexible sigmoidoscopy or colonoscopy.** / Every 5 years for a flexible sigmoidoscopy or every 10 years for a colonoscopy beginning at age 86 years and continuing until age 71 years.  Hepatitis C blood test.** / For all people born from [birth year] through 1966 and any individual with known risks for hepatitis C.  Osteoporosis screening.** / A one-time screening for women ages 83 years and over and women at risk for fractures or osteoporosis.  Skin self-exam. / Monthly.  Influenza vaccine. / Every year.  Tetanus, diphtheria, and acellular pertussis (Tdap/Td) vaccine.** / 1 dose of Td every 10 years.  Varicella vaccine.** / Consult your health care provider.  Zoster vaccine.** / 1 dose for adults aged 61 years or older.  Pneumococcal 13-valent conjugate (PCV13) vaccine.** / Consult your health care provider.  Pneumococcal  polysaccharide (PPSV23) vaccine.** / 1 dose for all adults aged 28 years and older.  Meningococcal vaccine.** / Consult your health care provider.  Hepatitis A vaccine.** / Consult your health care provider.  Hepatitis B vaccine.** / Consult your health care provider.  Haemophilus influenzae type b (Hib) vaccine.** / Consult your health care provider. ** Family history and personal history of risk and conditions may change your health care provider's recommendations. Document Released: 03/25/2001 Document Revised: 06/13/2013 Document Reviewed: 06/24/2010 [facility] Patient Information 2015 [place], [place]. This information is not intended to replace advice given to you by your health care provider. Make sure you discuss any questions you have with your health care provider.

## 2013-09-22 NOTE — Addendum Note (Signed)
Addended by: Lelon PerlaLOWNE, YVONNE R on: 09/22/2013 02:39 PM   Modules accepted: Orders

## 2013-09-22 NOTE — Addendum Note (Signed)
Addended by: Arnette NorrisPAYNE, Alix Lahmann P on: 09/22/2013 01:20 PM   Modules accepted: Orders

## 2013-09-22 NOTE — Addendum Note (Signed)
Addended by: Silvio PateHOMPSON, Lorree Millar D on: 09/22/2013 04:06 PM   Modules accepted: Orders

## 2013-09-22 NOTE — Progress Notes (Signed)
Subjective:    Glenda Lyons is a 68 y.o. female who presents for Medicare Annual/Subsequent preventive examination.  Preventive Screening-Counseling & Management  Tobacco History  Smoking status  . Never Smoker   Smokeless tobacco  . Never Used     Problems Prior to Visit 1. none  Current Problems (verified) Patient Active Problem List   Diagnosis Date Noted  . Dysuria 06/14/2010  . COLONIC POLYPS, HX OF 03/13/2010  . HYPERLIPIDEMIA 06/03/2007  . OSTEOARTHRITIS 06/03/2007  . UNSPECIFIED PERIPHERAL VASCULAR DISEASE 12/29/2006  . POSTMENOPAUSAL STATUS 12/29/2006  . DEPRESSION 07/10/2006  . HYPERTENSION 07/10/2006  . HERNIATED DISC 07/10/2006  . BURSITIS, RIGHT HIP 07/10/2006    Medications Prior to Visit Current Outpatient Prescriptions on File Prior to Visit  Medication Sig Dispense Refill  . aspirin 81 MG tablet Take 81 mg by mouth daily.      . fluticasone (FLONASE) 50 MCG/ACT nasal spray instill 2 sprays into each nostril at bedtime  16 g  2  . meloxicam (MOBIC) 15 MG tablet take 1/2 to 1 tablet by mouth once daily if needed  30 tablet  5  . Niacin CR 1000 MG TBCR take 1 tablet by mouth at bedtime  30 tablet  5  . potassium chloride SA (K-DUR,KLOR-CON) 20 MEQ tablet 2 tabs by mouth daily--office visit due now  60 tablet  0  . sertraline (ZOLOFT) 100 MG tablet 1.5 tab by mouth daily--  45 tablet  11  . simvastatin (ZOCOR) 40 MG tablet take 1 tablet by mouth at bedtime  30 tablet  5  . valsartan-hydrochlorothiazide (DIOVAN-HCT) 80-12.5 MG per tablet 1 tab by mouth daily--office visit due now  30 tablet  0  . ZETIA 10 MG tablet take 1 tablet by mouth once daily  30 tablet  5  . [DISCONTINUED] niacin (NIASPAN) 1000 MG CR tablet take 1 tablet by mouth at bedtime  30 tablet  5   No current facility-administered medications on file prior to visit.    Current Medications (verified) Current Outpatient Prescriptions  Medication Sig Dispense Refill  . aspirin 81 MG  tablet Take 81 mg by mouth daily.      . fluticasone (FLONASE) 50 MCG/ACT nasal spray instill 2 sprays into each nostril at bedtime  16 g  2  . meloxicam (MOBIC) 15 MG tablet take 1/2 to 1 tablet by mouth once daily if needed  30 tablet  5  . Niacin CR 1000 MG TBCR take 1 tablet by mouth at bedtime  30 tablet  5  . potassium chloride SA (K-DUR,KLOR-CON) 20 MEQ tablet 2 tabs by mouth daily--office visit due now  60 tablet  0  . sertraline (ZOLOFT) 100 MG tablet 1.5 tab by mouth daily--  45 tablet  11  . simvastatin (ZOCOR) 40 MG tablet take 1 tablet by mouth at bedtime  30 tablet  5  . valsartan-hydrochlorothiazide (DIOVAN-HCT) 80-12.5 MG per tablet 1 tab by mouth daily--office visit due now  30 tablet  0  . ZETIA 10 MG tablet take 1 tablet by mouth once daily  30 tablet  5  . [DISCONTINUED] niacin (NIASPAN) 1000 MG CR tablet take 1 tablet by mouth at bedtime  30 tablet  5   No current facility-administered medications for this visit.     Allergies (verified) Review of patient's allergies indicates no known allergies.   PAST HISTORY  Family History Family History  Problem Relation Age of Onset  . Breast cancer    .  Heart attack    . Lymphoma    . Colon cancer Neg Hx     Social History History  Substance Use Topics  . Smoking status: Never Smoker   . Smokeless tobacco: Never Used  . Alcohol Use: Yes     Comment: occasionally     Are there smokers in your home (other than you)? No  Risk Factors Current exercise habits: walking  Dietary issues discussed: na   Cardiac risk factors: advanced age (older than 2355 for men, 265 for women), dyslipidemia, hypertension and sedentary lifestyle.  Depression Screen (Note: if answer to either of the following is "Yes", a more complete depression screening is indicated)   Over the past two weeks, have you felt down, depressed or hopeless? yes  Over the past two weeks, have you felt little interest or pleasure in doing things? No  Have  you lost interest or pleasure in daily life? No  Do you often feel hopeless? No  Do you cry easily over simple problems? No  Activities of Daily Living In your present state of health, do you have any difficulty performing the following activities?:  Driving? No Managing money?  No Feeding yourself? No Getting from bed to chair? No Climbing a flight of stairs? No Preparing food and eating?: No Bathing or showering? No Getting dressed: No Getting to the toilet? No Using the toilet:No Moving around from place to place: No In the past year have you fallen or had a near fall?:No   Are you sexually active?  Yes  Do you have more than one partner?  No  Hearing Difficulties: No Do you often ask people to speak up or repeat themselves? No Do you experience ringing or noises in your ears? No Do you have difficulty understanding soft or whispered voices? No   Do you feel that you have a problem with memory? No  Do you often misplace items? No  Do you feel safe at home?  Yes  Cognitive Testing  Alert? Yes  Normal Appearance?Yes  Oriented to person? Yes  Place? Yes   Time? Yes  Recall of three objects?  Yes  Can perform simple calculations? Yes  Displays appropriate judgment?Yes  Can read the correct time from a watch face?Yes   Advanced Directives have been discussed with the patient? Yes  List the Names of Other Physician/Practitioners you currently use: 1.  opth--martin 2.  Dentist-  patel 3.  GI-- Nickolas MadridBrodie  Indicate any recent Medical Services you may have received from other than [facility] providers in the past year (date may be approximate).  Immunization History  Administered Date(s) Administered  . Influenza Split 12/11/2011  . Influenza Whole 09/19/2008  . Influenza, High Dose Seasonal PF 11/09/2012  . Pneumococcal Conjugate-13 09/22/2013  . Pneumococcal Polysaccharide-23 03/13/2010  . Td 03/13/2010    Screening Tests Health Maintenance  Topic Date Due  . Influenza  Vaccine  11/22/2013 (Originally 09/10/2013)  . Mammogram  10/30/2014  . Tetanus/tdap  03/13/2020  . Colonoscopy  06/16/2023  . Pneumococcal Polysaccharide Vaccine Age 69 And Over  Completed  . Zostavax  Completed    All answers were reviewed with the patient and necessary referrals were made:  Loreen FreudYvonne Lowne, DO   09/22/2013   History reviewed:  She  has a past medical history of Depression; Hypertension; Hyperlipemia; Osteoarthritis; Allergy; Neuromuscular disorder; Herniated disc; and Sciatica. She  does not have any pertinent problems on file. She  has past surgical history that includes Tonsillectomy; Biopsy  breast; LEEP (1994); Hysteroscopy with resectoscope (09/11/2010); Dilation and curettage of uterus (09/11/2010); Endometrial biopsy; and Cystectomy. Her family history includes Breast cancer in an other family member; Heart attack in an other family member; Lymphoma in an other family member. There is no history of Colon cancer. She  reports that she has never smoked. She has never used smokeless tobacco. She reports that she drinks alcohol. She reports that she does not use illicit drugs. She has a current medication list which includes the following prescription(s): aspirin, fluticasone, meloxicam, niacin cr, potassium chloride sa, sertraline, simvastatin, valsartan-hydrochlorothiazide, and zetia. Current Outpatient Prescriptions on File Prior to Visit  Medication Sig Dispense Refill  . aspirin 81 MG tablet Take 81 mg by mouth daily.      . fluticasone (FLONASE) 50 MCG/ACT nasal spray instill 2 sprays into each nostril at bedtime  16 g  2  . meloxicam (MOBIC) 15 MG tablet take 1/2 to 1 tablet by mouth once daily if needed  30 tablet  5  . Niacin CR 1000 MG TBCR take 1 tablet by mouth at bedtime  30 tablet  5  . potassium chloride SA (K-DUR,KLOR-CON) 20 MEQ tablet 2 tabs by mouth daily--office visit due now  60 tablet  0  . sertraline (ZOLOFT) 100 MG tablet 1.5 tab by mouth daily--  45  tablet  11  . simvastatin (ZOCOR) 40 MG tablet take 1 tablet by mouth at bedtime  30 tablet  5  . valsartan-hydrochlorothiazide (DIOVAN-HCT) 80-12.5 MG per tablet 1 tab by mouth daily--office visit due now  30 tablet  0  . ZETIA 10 MG tablet take 1 tablet by mouth once daily  30 tablet  5  . [DISCONTINUED] niacin (NIASPAN) 1000 MG CR tablet take 1 tablet by mouth at bedtime  30 tablet  5   No current facility-administered medications on file prior to visit.   She has No Known Allergies.  Review of Systems  Review of Systems  Constitutional: Negative for activity change, appetite change and fatigue.  HENT: Negative for hearing loss, congestion, tinnitus and ear discharge.   Eyes: Negative for visual disturbance (see optho q1y -- vision corrected to 20/20 with glasses).  Respiratory: Negative for cough, chest tightness and shortness of breath.   Cardiovascular: Negative for chest pain, palpitations and leg swelling.  Gastrointestinal: Negative for abdominal pain, diarrhea, constipation and abdominal distention.  Genitourinary: Negative for urgency, frequency, decreased urine volume and difficulty urinating.  Musculoskeletal: Negative for back pain, arthralgias and gait problem.  Skin: Negative for color change, pallor and rash.  Neurological: Negative for dizziness, light-headedness, numbness and headaches.  Hematological: Negative for adenopathy. Does not bruise/bleed easily.  Psychiatric/Behavioral: Negative for suicidal ideas, confusion, sleep disturbance, self-injury, dysphoric mood, decreased concentration and agitation.  Pt is able to read and write and can do all ADLs No risk for falling No abuse/ violence in home      Objective:     Vision by Snellen chart-- opth  Body mass index is 28.06 kg/(m^2). BP 134/82  Pulse 77  Temp(Src) 98.1 F (36.7 C) (Oral)  Ht 5' 4.5" (1.638 m)  Wt 166 lb (75.297 kg)  BMI 28.06 kg/m2  SpO2 97%  BP 134/82  Pulse 77  Temp(Src) 98.1 F  (36.7 C) (Oral)  Ht 5' 4.5" (1.638 m)  Wt 166 lb (75.297 kg)  BMI 28.06 kg/m2  SpO2 97% General appearance: alert, cooperative, appears stated age and no distress Head: Normocephalic, without obvious abnormality, atraumatic Eyes: conjunctivae/corneas  clear. PERRL, EOM's intact. Fundi benign. Ears: normal TM's and external ear canals both ears Nose: Nares normal. Septum midline. Mucosa normal. No drainage or sinus tenderness. Throat: lips, mucosa, and tongue normal; teeth and gums normal Neck: no adenopathy, no carotid bruit, no JVD, supple, symmetrical, trachea midline and thyroid not enlarged, symmetric, no tenderness/mass/nodules Back: symmetric, no curvature. ROM normal. No CVA tenderness. Lungs: clear to auscultation bilaterally Breasts: normal appearance, no masses or tenderness Heart: regular rate and rhythm, S1, S2 normal, no murmur, click, rub or gallop Abdomen: soft, non-tender; bowel sounds normal; no masses,  no organomegaly Pelvic: not indicated; post-menopausal, no abnormal Pap smears in past Extremities: extremities normal, atraumatic, no cyanosis or edema Pulses: 2+ and symmetric Skin: Skin color, texture, turgor normal. No rashes or lesions Lymph nodes: Cervical, supraclavicular, and axillary nodes normal. Neurologic: Alert and oriented X 3, normal strength and tone. Normal symmetric reflexes. Normal coordination and gait Psych--no depression, no anxiety      Assessment:     cpe      Plan:     During the course of the visit the patient was educated and counseled about appropriate screening and preventive services including:    Pneumococcal vaccine   Influenza vaccine  Screening mammography  Bone densitometry screening  Colorectal cancer screening  Diabetes screening  Glaucoma screening  Advanced directives: has an advanced directive - a copy HAS NOT been provided.  Diet review for nutrition referral? Yes ____  Not Indicated _x___   Patient  Instructions (the written plan) was given to the patient.  Medicare Attestation I have personally reviewed: The patient's medical and social history Their use of alcohol, tobacco or illicit drugs Their current medications and supplements The patient's functional ability including ADLs,fall risks, home safety risks, cognitive, and hearing and visual impairment Diet and physical activities Evidence for depression or mood disorders  The patient's weight, height, BMI, and visual acuity have been recorded in the chart.  I have made referrals, counseling, and provided education to the patient based on review of the above and I have provided the patient with a written personalized care plan for preventive services.    1. Need for pneumococcal vaccine   - Pneumococcal conjugate vaccine 13-valent  2. Preventative health care    3. Depression   - sertraline (ZOLOFT) 100 MG tablet; 1.5 tab by mouth daily--  Dispense: 135 tablet; Refill: 3  4. Essential hypertension Stable, con't meds - valsartan-hydrochlorothiazide (DIOVAN-HCT) 80-12.5 MG per tablet; 1 tab by mouth daily-  Dispense: 90 tablet; Refill: 3   [name], DO   09/22/2013

## 2013-09-23 ENCOUNTER — Telehealth: Payer: Self-pay | Admitting: Family Medicine

## 2013-09-23 LAB — BASIC METABOLIC PANEL
BUN: 23 mg/dL (ref 6–23)
CHLORIDE: 104 meq/L (ref 96–112)
CO2: 27 meq/L (ref 19–32)
CREATININE: 0.9 mg/dL (ref 0.4–1.2)
Calcium: 10.2 mg/dL (ref 8.4–10.5)
GFR: 66.9 mL/min (ref >60.00)
Glucose, Bld: 91 mg/dL (ref 70–99)
POTASSIUM: 3.5 meq/L (ref 3.5–5.1)
Sodium: 139 mEq/L (ref 135–145)

## 2013-09-23 LAB — CBC WITH DIFFERENTIAL/PLATELET
BASOS PCT: 0.4 % (ref 0.0–3.0)
Basophils Absolute: 0 10*3/uL (ref 0.0–0.1)
EOS ABS: 0.1 10*3/uL (ref 0.0–0.7)
EOS PCT: 1.1 % (ref 0.0–5.0)
HEMATOCRIT: 44.3 % (ref 36.0–46.0)
Hemoglobin: 14.9 g/dL (ref 12.0–15.0)
LYMPHS ABS: 1.6 10*3/uL (ref 0.7–4.0)
Lymphocytes Relative: 20 % (ref 12.0–46.0)
MCHC: 33.6 g/dL (ref 30.0–36.0)
MCV: 94.8 fl (ref 78.0–100.0)
MONO ABS: 0.5 10*3/uL (ref 0.1–1.0)
Monocytes Relative: 5.9 % (ref 3.0–12.0)
NEUTROS PCT: 72.6 % (ref 43.0–77.0)
Neutro Abs: 6 10*3/uL (ref 1.4–7.7)
Platelets: 208 10*3/uL (ref 150.0–400.0)
RBC: 4.67 Mil/uL (ref 3.87–5.11)
RDW: 14 % (ref 11.5–15.5)
WBC: 8.2 10*3/uL (ref 4.0–10.5)

## 2013-09-23 LAB — HEPATIC FUNCTION PANEL
ALT: 24 U/L (ref 0–35)
AST: 23 U/L (ref 0–37)
Albumin: 4.6 g/dL (ref 3.5–5.2)
Alkaline Phosphatase: 55 U/L (ref 39–117)
Bilirubin, Direct: 0.1 mg/dL (ref 0.0–0.3)
TOTAL PROTEIN: 7.4 g/dL (ref 6.0–8.3)
Total Bilirubin: 0.9 mg/dL (ref 0.2–1.2)

## 2013-09-23 LAB — LIPID PANEL
CHOL/HDL RATIO: 2
Cholesterol: 174 mg/dL (ref 0–200)
HDL: 85.8 mg/dL (ref >39.00)
LDL Cholesterol: 75 mg/dL (ref 0–99)
NonHDL: 88.2
TRIGLYCERIDES: 64 mg/dL (ref 0.0–149.0)
VLDL: 12.8 mg/dL (ref 0.0–40.0)

## 2013-09-23 NOTE — Telephone Encounter (Signed)
Relevant patient education mailed to patient.  

## 2013-09-25 LAB — URINE CULTURE

## 2013-09-26 LAB — CYTOLOGY - PAP

## 2013-09-27 ENCOUNTER — Other Ambulatory Visit: Payer: Self-pay

## 2013-09-27 MED ORDER — CIPROFLOXACIN HCL 500 MG PO TABS: 500.0000 mg | ORAL_TABLET | Freq: Two times a day (BID) | ORAL | Status: DC

## 2013-09-29 ENCOUNTER — Telehealth: Payer: Self-pay

## 2013-09-29 NOTE — Telephone Encounter (Signed)
Received report from [facility] and per Dr.Lowne the patient has degenerative disc disease with occasional Sciatic pain.. Consider MRI if pain persists.  MSg left for the patient to call the office      KP

## 2013-09-30 NOTE — Telephone Encounter (Signed)
MSG left to call the office      KP 

## 2013-10-03 NOTE — Telephone Encounter (Signed)
MSG left to call the office      KP 

## 2013-10-04 NOTE — Telephone Encounter (Signed)
Letter mailed     KP 

## 2013-10-17 ENCOUNTER — Other Ambulatory Visit: Payer: Self-pay | Admitting: Family Medicine

## 2013-11-01 ENCOUNTER — Ambulatory Visit
Admission: RE | Admit: 2013-11-01 | Discharge: 2013-11-01 | Disposition: A | Discharge status: Home / Self Care | Payer: Medicare Other | Source: Ambulatory Visit

## 2013-11-01 DIAGNOSIS — Z1231 Encounter for screening mammogram for malignant neoplasm of breast: Secondary | ICD-10-CM

## 2013-12-28 ENCOUNTER — Other Ambulatory Visit: Payer: Self-pay | Admitting: Family Medicine

## 2014-01-18 ENCOUNTER — Other Ambulatory Visit: Payer: Self-pay | Admitting: Family Medicine

## 2014-01-19 ENCOUNTER — Other Ambulatory Visit: Payer: Self-pay | Admitting: Family Medicine

## 2014-03-22 ENCOUNTER — Other Ambulatory Visit: Payer: Self-pay | Admitting: Family Medicine

## 2014-03-25 ENCOUNTER — Other Ambulatory Visit: Payer: Self-pay | Admitting: Family Medicine

## 2014-03-30 ENCOUNTER — Ambulatory Visit: Payer: Medicare Other | Admitting: Family Medicine

## 2014-04-27 ENCOUNTER — Other Ambulatory Visit: Payer: Self-pay | Admitting: Family Medicine

## 2014-04-30 ENCOUNTER — Other Ambulatory Visit: Payer: Self-pay | Admitting: Family Medicine

## 2014-04-30 DIAGNOSIS — E785 Hyperlipidemia, unspecified: Secondary | ICD-10-CM

## 2014-05-01 NOTE — Telephone Encounter (Signed)
272.4 lipid, hep

## 2014-08-17 ENCOUNTER — Other Ambulatory Visit: Payer: Self-pay | Admitting: Family Medicine

## 2014-08-28 ENCOUNTER — Encounter: Payer: Self-pay | Admitting: Family Medicine

## 2014-08-28 ENCOUNTER — Ambulatory Visit (INDEPENDENT_AMBULATORY_CARE_PROVIDER_SITE_OTHER): Payer: Medicare Other | Admitting: Family Medicine

## 2014-08-28 VITALS — BP 124/72 | HR 67 | Temp 98.0°F | Ht 65.0 in | Wt 165.6 lb

## 2014-08-28 DIAGNOSIS — M5440 Lumbago with sciatica, unspecified side: Secondary | ICD-10-CM

## 2014-08-28 DIAGNOSIS — E785 Hyperlipidemia, unspecified: Secondary | ICD-10-CM

## 2014-08-28 LAB — HEPATIC FUNCTION PANEL
ALBUMIN: 4.3 g/dL (ref 3.5–5.2)
ALT: 16 U/L (ref 0–35)
AST: 20 U/L (ref 0–37)
Alkaline Phosphatase: 59 U/L (ref 39–117)
BILIRUBIN DIRECT: 0.1 mg/dL (ref 0.0–0.3)
Total Bilirubin: 0.6 mg/dL (ref 0.2–1.2)
Total Protein: 7.1 g/dL (ref 6.0–8.3)

## 2014-08-28 LAB — BASIC METABOLIC PANEL
BUN: 23 mg/dL (ref 6–23)
CALCIUM: 9.5 mg/dL (ref 8.4–10.5)
CHLORIDE: 105 meq/L (ref 96–112)
CO2: 27 meq/L (ref 19–32)
Creatinine, Ser: 0.71 mg/dL (ref 0.40–1.20)
GFR: 86.6 mL/min (ref >60.00)
Glucose, Bld: 103 mg/dL — ABNORMAL HIGH (ref 70–99)
POTASSIUM: 3.3 meq/L — AB (ref 3.5–5.1)
SODIUM: 140 meq/L (ref 135–145)

## 2014-08-28 LAB — LIPID PANEL
CHOL/HDL RATIO: 3
Cholesterol: 212 mg/dL — ABNORMAL HIGH (ref 0–200)
HDL: 83.5 mg/dL (ref >39.00)
LDL CALC: 115 mg/dL — AB (ref 0–99)
NONHDL: 128.5
TRIGLYCERIDES: 70 mg/dL (ref 0.0–149.0)
VLDL: 14 mg/dL (ref 0.0–40.0)

## 2014-08-28 MED ORDER — MELOXICAM 15 MG PO TABS: ORAL_TABLET | ORAL | Status: DC

## 2014-08-28 MED ORDER — SIMVASTATIN 40 MG PO TABS: 40.0000 mg | ORAL_TABLET | Freq: Every day | ORAL | Status: DC

## 2014-08-28 MED ORDER — EZETIMIBE 10 MG PO TABS: 10.0000 mg | ORAL_TABLET | Freq: Every day | ORAL | Status: DC

## 2014-08-28 MED ORDER — NIACIN ER (ANTIHYPERLIPIDEMIC) 1000 MG PO TBCR: 1000.0000 mg | EXTENDED_RELEASE_TABLET | Freq: Every day | ORAL | Status: DC

## 2014-08-28 NOTE — Progress Notes (Signed)
Pre visit review using our clinic review tool, if applicable. No additional management support is needed unless otherwise documented below in the visit note. 

## 2014-08-28 NOTE — Assessment & Plan Note (Signed)
Pt has been off the zetia and simvastatin for months.  She just ran out and did not get it filled.

## 2014-08-28 NOTE — Progress Notes (Signed)
Patient ID: Glenda Lyons, female    DOB: 16-Apr-1945  Age: 69 y.o. MRN: [id number]    Subjective:  Subjective HPI [name] presents for f/u ns visit.  Pt seeing [facility] tomorrow for a second opinion.    Review of Systems  Constitutional: Negative for diaphoresis, appetite change, fatigue and unexpected weight change.  Eyes: Negative for pain, redness and visual disturbance.  Respiratory: Negative for cough, chest tightness, shortness of breath and wheezing.   Cardiovascular: Negative for chest pain, palpitations and leg swelling.  Endocrine: Negative for cold intolerance, heat intolerance, polydipsia, polyphagia and polyuria.  Genitourinary: Negative for dysuria, frequency and difficulty urinating.  Neurological: Negative for dizziness, light-headedness, numbness and headaches.  Psychiatric/Behavioral: Negative for decreased concentration. The patient is not nervous/anxious.     History Past Medical History  Diagnosis Date  . Depression   . Hypertension   . Hyperlipemia   . Osteoarthritis   . Allergy     seasonal  . Neuromuscular disorder   . Herniated disc   . Sciatica     She has past surgical history that includes Tonsillectomy; Biopsy breast; LEEP (1994); Hysteroscopy with resectoscope (09/11/2010); Dilation and curettage of uterus (09/11/2010); Endometrial biopsy; and Cystectomy.   Her family history includes Breast cancer in an other family member; Heart attack in an other family member; Lymphoma in an other family member. There is no history of Colon cancer.She reports that she has never smoked. She has never used smokeless tobacco. She reports that she drinks alcohol. She reports that she does not use illicit drugs.  Current Outpatient Prescriptions on File Prior to Visit  Medication Sig Dispense Refill  . aspirin 81 MG tablet Take 81 mg by mouth daily.    . fluticasone (FLONASE) 50 MCG/ACT nasal spray instill 2 sprays into each nostril at bedtime 16 g  2  . potassium chloride SA (K-DUR,KLOR-CON) 20 MEQ tablet Take 2 tablets (40 mEq total) by mouth daily. Office visit with Labs due now 60 tablet 0  . sertraline (ZOLOFT) 100 MG tablet 1.5 tab by mouth daily-- 135 tablet 3  . valsartan-hydrochlorothiazide (DIOVAN-HCT) 80-12.5 MG per tablet 1 tab by mouth daily- 90 tablet 3   No current facility-administered medications on file prior to visit.     Objective:  Objective Physical Exam  Constitutional: She is oriented to person, place, and time. She appears well-developed and well-nourished.  HENT:  Head: Normocephalic and atraumatic.  Eyes: Conjunctivae and EOM are normal.  Neck: Normal range of motion. Neck supple. No JVD present. Carotid bruit is not present. No thyromegaly present.  Cardiovascular: Normal rate, regular rhythm and normal heart sounds.   No murmur heard. Pulmonary/Chest: Effort normal and breath sounds normal. No respiratory distress. She has no wheezes. She has no rales. She exhibits no tenderness.  Musculoskeletal: She exhibits no edema.  Neurological: She is alert and oriented to person, place, and time.  Psychiatric: She has a normal mood and affect. Her behavior is normal.   BP 124/72 mmHg  Pulse 67  Temp(Src) 98 F (36.7 C) (Oral)  Ht  (1.651 m)  Wt 165 lb 9.6 oz (75.116 kg)  BMI 27.56 kg/m2  SpO2 98% Wt Readings from Last 3 Encounters:  08/28/14 165 lb 9.6 oz (75.116 kg)  09/22/13 166 lb (75.297 kg)  06/15/13 172 lb (78.019 kg)     Lab Results  Component Value Date   WBC 8.2 09/22/2013   HGB 14.9 09/22/2013   HCT 44.3  09/22/2013   PLT 208.0 09/22/2013   GLUCOSE 91 09/22/2013   CHOL 174 09/22/2013   TRIG 64.0 09/22/2013   HDL 85.80 09/22/2013   LDLCALC 75 09/22/2013   ALT 24 09/22/2013   AST 23 09/22/2013   NA 139 09/22/2013   K 3.5 09/22/2013   CL 104 09/22/2013   CREATININE 0.9 09/22/2013   BUN 23 09/22/2013   CO2 27 09/22/2013   TSH 3.19 12/11/2011   HGBA1C 6.1* 12/15/2005    Mm  Screening Breast Tomo Bilateral  11/02/2013   CLINICAL DATA:  Screening.  EXAM: DIGITAL SCREENING BILATERAL MAMMOGRAM WITH 3D TOMO WITH CAD  COMPARISON:  Previous exam(s).  ACR Breast Density Category b: There are scattered areas of fibroglandular density.  FINDINGS: There are no findings suspicious for malignancy. Images were processed with CAD.  IMPRESSION: No mammographic evidence of malignancy. A result letter of this screening mammogram will be mailed directly to the patient.  RECOMMENDATION: Screening mammogram in one year. (Code:SM-B-01Y)  BI-RADS CATEGORY  1: Negative.   Electronically Signed   By: Britta MccreedySusan  Turner M.D.   On: 11/02/2013 15:03     Assessment & Plan:  Plan I have discontinued Ms. Towell's ciprofloxacin. I have also changed her simvastatin and niacin. Additionally, I am having her maintain her fluticasone, aspirin, valsartan-hydrochlorothiazide, sertraline, potassium chloride SA, ezetimibe, and meloxicam.  Meds ordered this encounter  Medications  . ezetimibe (ZETIA) 10 MG tablet    Sig: Take 1 tablet (10 mg total) by mouth daily. Repeat labs are due now    Dispense:  90 tablet    Refill:  1  . meloxicam (MOBIC) 15 MG tablet    Sig: take 1/2 to 1 tablet by mouth once daily if needed    Dispense:  30 tablet    Refill:  0  . simvastatin (ZOCOR) 40 MG tablet    Sig: Take 1 tablet (40 mg total) by mouth at bedtime.    Dispense:  90 tablet    Refill:  1  . niacin (NIASPAN) 1000 MG CR tablet    Sig: Take 1 tablet (1,000 mg total) by mouth at bedtime.    Dispense:  90 tablet    Refill:  1    Problem List Items Addressed This Visit    Hyperlipidemia - Primary    Pt has been off the zetia and simvastatin for months.  She just ran out and did not get it filled.       Relevant Medications   ezetimibe (ZETIA) 10 MG tablet   simvastatin (ZOCOR) 40 MG tablet   niacin (NIASPAN) 1000 MG CR tablet   Other Relevant Orders   Basic metabolic panel   Hepatic function panel    Lipid panel    Other Visit Diagnoses    Low back pain with sciatica, sciatica laterality unspecified, unspecified back pain laterality        Relevant Medications    meloxicam (MOBIC) 15 MG tablet       Follow-up: Return if symptoms worsen or fail to improve, for hyperlipidemia.  Loreen FreudYvonne Lowne, DO

## 2014-08-28 NOTE — Patient Instructions (Signed)

## 2014-09-01 NOTE — Progress Notes (Signed)
She can stop the potassium once we change the diovan

## 2014-09-12 DIAGNOSIS — M48062 Spinal stenosis, lumbar region with neurogenic claudication: Secondary | ICD-10-CM

## 2014-09-12 HISTORY — DX: Spinal stenosis, lumbar region with neurogenic claudication: M48.062

## 2014-09-25 ENCOUNTER — Other Ambulatory Visit: Payer: Self-pay | Admitting: Family Medicine

## 2014-10-19 ENCOUNTER — Encounter: Payer: Self-pay | Admitting: Family Medicine

## 2014-10-23 ENCOUNTER — Other Ambulatory Visit: Payer: Self-pay | Admitting: Family Medicine

## 2014-11-29 ENCOUNTER — Other Ambulatory Visit: Payer: Self-pay | Admitting: Family Medicine

## 2014-12-13 ENCOUNTER — Telehealth: Payer: Self-pay

## 2014-12-13 NOTE — Telephone Encounter (Signed)
Left message call back to schedule AWV

## 2014-12-21 DIAGNOSIS — Z981 Arthrodesis status: Secondary | ICD-10-CM | POA: Insufficient documentation

## 2014-12-21 HISTORY — DX: Arthrodesis status: Z98.1

## 2014-12-26 ENCOUNTER — Encounter: Payer: Self-pay | Admitting: Physician Assistant

## 2014-12-26 ENCOUNTER — Ambulatory Visit (INDEPENDENT_AMBULATORY_CARE_PROVIDER_SITE_OTHER): Payer: Medicare Other | Admitting: Physician Assistant

## 2014-12-26 VITALS — BP 115/58 | HR 90 | Temp 100.0°F | Resp 16 | Ht 65.0 in | Wt 167.4 lb

## 2014-12-26 DIAGNOSIS — J019 Acute sinusitis, unspecified: Secondary | ICD-10-CM

## 2014-12-26 DIAGNOSIS — B9689 Other specified bacterial agents as the cause of diseases classified elsewhere: Secondary | ICD-10-CM

## 2014-12-26 DIAGNOSIS — H109 Unspecified conjunctivitis: Secondary | ICD-10-CM | POA: Diagnosis not present

## 2014-12-26 HISTORY — DX: Unspecified conjunctivitis: H10.9

## 2014-12-26 HISTORY — DX: Other specified bacterial agents as the cause of diseases classified elsewhere: B96.89

## 2014-12-26 MED ORDER — POLYMYXIN B-TRIMETHOPRIM 10000-0.1 UNIT/ML-% OP SOLN: 1.0000 [drp] | OPHTHALMIC | Status: DC

## 2014-12-26 MED ORDER — AMOXICILLIN-POT CLAVULANATE 875-125 MG PO TABS: 1.0000 | ORAL_TABLET | Freq: Two times a day (BID) | ORAL | Status: DC

## 2014-12-26 NOTE — Assessment & Plan Note (Signed)
Rx Augmentin.  Increase fluids.  Rest.  Saline nasal spray.  Probiotic.  Mucinex as directed.  Humidifier in bedroom. Delsym for cough.  Call or return to clinic if symptoms are not improving.  

## 2014-12-26 NOTE — Assessment & Plan Note (Signed)
Rx Polytrim.  Supportive measures reviewed. 

## 2014-12-26 NOTE — Progress Notes (Signed)
Pre visit review using our clinic review tool, if applicable. No additional management support is needed unless otherwise documented below in the visit note/SLS  

## 2014-12-26 NOTE — Patient Instructions (Addendum)
Please take antibiotic as directed.  Increase fluid intake.  Use Saline nasal spray.  Take a daily multivitamin. Use Delsym over-the-counter for cough..  Place a humidifier in the bedroom.  Please call or return clinic if symptoms are not improving. Use the eye drops as directed for infection for 7 days.  Sinusitis Sinusitis is redness, soreness, and swelling (inflammation) of the paranasal sinuses. Paranasal sinuses are air pockets within the bones of your face (beneath the eyes, the middle of the forehead, or above the eyes). In healthy paranasal sinuses, mucus is able to drain out, and air is able to circulate through them by way of your nose. However, when your paranasal sinuses are inflamed, mucus and air can become trapped. This can allow bacteria and other germs to grow and cause infection. Sinusitis can develop quickly and last only a short time (acute) or continue over a long period (chronic). Sinusitis that lasts for more than 12 weeks is considered chronic.  CAUSES  Causes of sinusitis include:  Allergies.  Structural abnormalities, such as displacement of the cartilage that separates your nostrils (deviated septum), which can decrease the air flow through your nose and sinuses and affect sinus drainage.  Functional abnormalities, such as when the small hairs (cilia) that line your sinuses and help remove mucus do not work properly or are not present. SYMPTOMS  Symptoms of acute and chronic sinusitis are the same. The primary symptoms are pain and pressure around the affected sinuses. Other symptoms include:  Upper toothache.  Earache.  Headache.  Bad breath.  Decreased sense of smell and taste.  A cough, which worsens when you are lying flat.  Fatigue.  Fever.  Thick drainage from your nose, which often is green and may contain pus (purulent).  Swelling and warmth over the affected sinuses. DIAGNOSIS  Your caregiver will perform a physical exam. During the exam, your  caregiver may:  Look in your nose for signs of abnormal growths in your nostrils (nasal polyps).  Tap over the affected sinus to check for signs of infection.  View the inside of your sinuses (endoscopy) with a special imaging device with a light attached (endoscope), which is inserted into your sinuses. If your caregiver suspects that you have chronic sinusitis, one or more of the following tests may be recommended:  Allergy tests.  Nasal culture A sample of mucus is taken from your nose and sent to a lab and screened for bacteria.  Nasal cytology A sample of mucus is taken from your nose and examined by your caregiver to determine if your sinusitis is related to an allergy. TREATMENT  Most cases of acute sinusitis are related to a viral infection and will resolve on their own within 10 days. Sometimes medicines are prescribed to help relieve symptoms (pain medicine, decongestants, nasal steroid sprays, or saline sprays).  However, for sinusitis related to a bacterial infection, your caregiver will prescribe antibiotic medicines. These are medicines that will help kill the bacteria causing the infection.  Rarely, sinusitis is caused by a fungal infection. In theses cases, your caregiver will prescribe antifungal medicine. For some cases of chronic sinusitis, surgery is needed. Generally, these are cases in which sinusitis recurs more than 3 times per year, despite other treatments. HOME CARE INSTRUCTIONS   Drink plenty of water. Water helps thin the mucus so your sinuses can drain more easily.  Use a humidifier.  Inhale steam 3 to 4 times a day (for example, sit in the bathroom with the shower  running).  Apply a warm, moist washcloth to your face 3 to 4 times a day, or as directed by your caregiver.  Use saline nasal sprays to help moisten and clean your sinuses.  Take over-the-counter or prescription medicines for pain, discomfort, or fever only as directed by your caregiver. SEEK  IMMEDIATE MEDICAL CARE IF:  You have increasing pain or severe headaches.  You have nausea, vomiting, or drowsiness.  You have swelling around your face.  You have vision problems.  You have a stiff neck.  You have difficulty breathing. MAKE SURE YOU:   Understand these instructions.  Will watch your condition.  Will get help right away if you are not doing well or get worse. Document Released: 01/27/2005 Document Revised: 04/21/2011 Document Reviewed: 02/11/2011 [facility] Patient Information 2014 [place], [place].

## 2014-12-26 NOTE — Progress Notes (Signed)
Patient presents to clinic today c/o 5 days of progressively worsening sinus pressure, sinus pain, ear pressure with non-productive cough. Endorses sinus and facial pain starting yesterday with mild fever. Has notes some mild eye redness and drainage over the past day.  Denies chest pain or SOB. Endorses some mild, rare wheezing. Denies recent travel or sick contact. Patient endorses having her flu shot this year.  Past Medical History  Diagnosis Date  . Depression   . Hypertension   . Hyperlipemia   . Osteoarthritis   . Allergy     seasonal  . Neuromuscular disorder (HCC)   . Herniated disc   . Sciatica     Current Outpatient Prescriptions on File Prior to Visit  Medication Sig Dispense Refill  . aspirin 81 MG tablet Take 81 mg by mouth daily.    [name]. ezetimibe (ZETIA) 10 MG tablet Take 1 tablet (10 mg total) by mouth daily. Repeat labs are due now 90 tablet 1  . fluticasone (FLONASE) 50 MCG/ACT nasal spray instill 2 sprays into each nostril at bedtime 16 g 2  . niacin (NIASPAN) 1000 MG CR tablet Take 1 tablet (1,000 mg total) by mouth at bedtime. 90 tablet 1  . potassium chloride SA (K-DUR,KLOR-CON) 20 MEQ tablet take 2 tablets by mouth once daily 60 tablet 2  . sertraline (ZOLOFT) 100 MG tablet take 1 and 1/2 tablets by mouth once daily 135 tablet 1  . simvastatin (ZOCOR) 40 MG tablet Take 1 tablet (40 mg total) by mouth at bedtime. 90 tablet 1  . valsartan-hydrochlorothiazide (DIOVAN-HCT) 80-12.5 MG tablet take 1 tablet by mouth once daily 90 tablet 1  . meloxicam (MOBIC) 15 MG tablet take 1/2 to 1 tablet by mouth once daily if needed (Patient not taking: Reported on 12/26/2014) 30 tablet 0   No current facility-administered medications on file prior to visit.    No Known Allergies  Family History  Problem Relation Age of Onset  . Breast cancer    . Heart attack    . Lymphoma    . Colon cancer Neg Hx     Social History   Social History  . Marital Status: Married   Spouse Name: N/A  . Number of Children: N/A  . Years of Education: N/A   Social History Main Topics  . Smoking status: Never Smoker   . Smokeless tobacco: Never Used  . Alcohol Use: Yes     Comment: occasionally  . Drug Use: No  . Sexual Activity:    Partners: Male   Other Topics Concern  . None   Social History Narrative   Exercise-- just walking up and down the stairs at home   Review of Systems - See HPI.  All other ROS are negative.  BP 115/58 mmHg  Pulse 90  Temp(Src) 100 F (37.8 C) (Oral)  Resp 16  Ht 5\' 5"  (1.651 m)  Wt 167 lb 6 oz (75.921 kg)  BMI 27.85 kg/m2  SpO2 99%  Physical Exam  Constitutional: She is oriented to person, place, and time and well-developed, well-nourished, and in no distress.  HENT:  Head: Normocephalic and atraumatic.  Right Ear: External ear normal.  Left Ear: External ear normal.  Nose: Nose normal.  Mouth/Throat: Oropharynx is clear and moist. No oropharyngeal exudate.  + TTP of sinuses on exam.  Eyes: Right eye exhibits discharge. Left eye exhibits discharge. Right conjunctiva is injected. Left conjunctiva is injected.  Neck: Neck supple.  Cardiovascular: Normal rate, regular rhythm,  normal heart sounds and intact distal pulses.   Pulmonary/Chest: Effort normal and breath sounds normal. No respiratory distress. She has no wheezes. She has no rales. She exhibits no tenderness.  Lymphadenopathy:    She has no cervical adenopathy.  Neurological: She is alert and oriented to person, place, and time.  Skin: Skin is warm and dry. No rash noted.  Psychiatric: Affect normal.  Vitals reviewed.  No results found for this or any previous visit (from the past 2160 hour(s)).  Assessment/Plan: Acute bacterial sinusitis Rx Augmentin.  Increase fluids.  Rest.  Saline nasal spray.  Probiotic.  Mucinex as directed.  Humidifier in bedroom. Delsym for cough.  Call or return to clinic if symptoms are not improving.   Bilateral  conjunctivitis Rx Polytrim. Supportive measures reviewed.

## 2015-01-08 ENCOUNTER — Ambulatory Visit (INDEPENDENT_AMBULATORY_CARE_PROVIDER_SITE_OTHER): Payer: Medicare Other | Admitting: Family

## 2015-01-08 ENCOUNTER — Encounter: Payer: Self-pay | Admitting: Family

## 2015-01-08 ENCOUNTER — Encounter (INDEPENDENT_AMBULATORY_CARE_PROVIDER_SITE_OTHER): Payer: Self-pay

## 2015-01-08 VITALS — BP 133/72 | HR 72 | Temp 98.2°F | Resp 16 | Ht 65.0 in | Wt 170.4 lb

## 2015-01-08 DIAGNOSIS — J069 Acute upper respiratory infection, unspecified: Secondary | ICD-10-CM

## 2015-01-08 DIAGNOSIS — B9789 Other viral agents as the cause of diseases classified elsewhere: Principal | ICD-10-CM

## 2015-01-08 DIAGNOSIS — J029 Acute pharyngitis, unspecified: Secondary | ICD-10-CM

## 2015-01-08 LAB — POCT RAPID STREP A (OFFICE): Rapid Strep A Screen: NEGATIVE

## 2015-01-08 NOTE — Progress Notes (Signed)
Pre visit review using our clinic review tool, if applicable. No additional management support is needed unless otherwise documented below in the visit note. 

## 2015-01-08 NOTE — Patient Instructions (Signed)
Continue delsym as needed for cough.  Call if symptoms worsen, if fever >101, or if symptoms do not continue to improve.   Upper Respiratory Infection, Adult Most upper respiratory infections (URIs) are a viral infection of the air passages leading to the lungs. A URI affects the nose, throat, and upper air passages. The most common type of URI is nasopharyngitis and is typically referred to as "the common cold." URIs run their course and usually go away on their own. Most of the time, a URI does not require medical attention, but sometimes a bacterial infection in the upper airways can follow a viral infection. This is called a secondary infection. Sinus and middle ear infections are common types of secondary upper respiratory infections. Bacterial pneumonia can also complicate a URI. A URI can worsen asthma and chronic obstructive pulmonary disease (COPD). Sometimes, these complications can require emergency medical care and may be life threatening.  CAUSES Almost all URIs are caused by viruses. A virus is a type of germ and can spread from one person to another.  RISKS FACTORS You may be at risk for a URI if:   You smoke.   You have chronic heart or lung disease.  You have a weakened defense (immune) system.   You are very young or very old.   You have nasal allergies or asthma.  You work in crowded or poorly ventilated areas.  You work in health care facilities or schools. SIGNS AND SYMPTOMS  Symptoms typically develop 2-3 days after you come in contact with a cold virus. Most viral URIs last 7-10 days. However, viral URIs from the influenza virus (flu virus) can last 14-18 days and are typically more severe. Symptoms may include:   Runny or stuffy (congested) nose.   Sneezing.   Cough.   Sore throat.   Headache.   Fatigue.   Fever.   Loss of appetite.   Pain in your forehead, behind your eyes, and over your cheekbones (sinus pain).  Muscle aches.   DIAGNOSIS  Your health care provider may diagnose a URI by:  Physical exam.  Tests to check that your symptoms are not due to another condition such as:  Strep throat.  Sinusitis.  Pneumonia.  Asthma. TREATMENT  A URI goes away on its own with time. It cannot be cured with medicines, but medicines may be prescribed or recommended to relieve symptoms. Medicines may help:  Reduce your fever.  Reduce your cough.  Relieve nasal congestion. HOME CARE INSTRUCTIONS   Take medicines only as directed by your health care provider.   Gargle warm saltwater or take cough drops to comfort your throat as directed by your health care provider.  Use a warm mist humidifier or inhale steam from a shower to increase air moisture. This may make it easier to breathe.  Drink enough fluid to keep your urine clear or pale yellow.   Eat soups and other clear broths and maintain good nutrition.   Rest as needed.   Return to work when your temperature has returned to normal or as your health care provider advises. You may need to stay home longer to avoid infecting others. You can also use a face mask and careful hand washing to prevent spread of the virus.  Increase the usage of your inhaler if you have asthma.   Do not use any tobacco products, including cigarettes, chewing tobacco, or electronic cigarettes. If you need help quitting, ask your health care provider. PREVENTION  The best  way to protect yourself from getting a cold is to practice good hygiene.   Avoid oral or hand contact with people with cold symptoms.   Wash your hands often if contact occurs.  There is no clear evidence that vitamin C, vitamin E, echinacea, or exercise reduces the chance of developing a cold. However, it is always recommended to get plenty of rest, exercise, and practice good nutrition.  SEEK MEDICAL CARE IF:   You are getting worse rather than better.   Your symptoms are not controlled by  medicine.   You have chills.  You have worsening shortness of breath.  You have brown or red mucus.  You have yellow or brown nasal discharge.  You have pain in your face, especially when you bend forward.  You have a fever.  You have swollen neck glands.  You have pain while swallowing.  You have white areas in the back of your throat. SEEK IMMEDIATE MEDICAL CARE IF:   You have severe or persistent:  Headache.  Ear pain.  Sinus pain.  Chest pain.  You have chronic lung disease and any of the following:  Wheezing.  Prolonged cough.  Coughing up blood.  A change in your usual mucus.  You have a stiff neck.  You have changes in your:  Vision.  Hearing.  Thinking.  Mood. MAKE SURE YOU:   Understand these instructions.  Will watch your condition.  Will get help right away if you are not doing well or get worse.   This information is not intended to replace advice given to you by your health care provider. Make sure you discuss any questions you have with your health care provider.   Document Released: 07/23/2000 Document Revised: 06/13/2014 Document Reviewed: 05/04/2013 Elsevier Interactive Patient Education [employer]2016 [employer] Inc[employer]

## 2015-01-08 NOTE — Progress Notes (Signed)
Subjective:    Patient ID: Glenda Lyons, female    DOB: Mar 07, 1945, 69 y.o.   MRN: [id number]  HPI  Glenda Lyons is a 69 yr old female who presents today with chief complaint of sore throat and fatigue. Completed augmentin for sinusitis 1 week ago.  She reports that her sinus symptoms and conjunctivitis resolved.  She reports that sore throat has been present x 3 weeks, but 1 week ago worsened.  Notes that it feels better today. Mild aching ears and lower jaw- but "nothing like it was before." She reports + associated fatigue. Fatigue has been present x 2 weeks.  Mild cough- improved with delsym.  Review of Systems Past Medical History  Diagnosis Date  . Depression   . Hypertension   . Hyperlipemia   . Osteoarthritis   . Allergy     seasonal  . Neuromuscular disorder (HCC)   . Herniated disc   . Sciatica     Social History   Social History  . Marital Status: Married    Spouse Name: N/A  . Number of Children: N/A  . Years of Education: N/A   Occupational History  . Not on file.   Social History Main Topics  . Smoking status: Never Smoker   . Smokeless tobacco: Never Used  . Alcohol Use: Yes     Comment: occasionally  . Drug Use: No  . Sexual Activity:    Partners: Male   Other Topics Concern  . Not on file   Social History Narrative   Exercise-- just walking up and down the stairs at home    Past Surgical History  Procedure Laterality Date  . Tonsillectomy    . Biopsy breast    . Leep  1994  . Hysteroscopy with resectoscope  09/11/2010    Procedure: HYSTEROSCOPY WITH RESECTOSCOPE;  Surgeon: Michael LitterNaima A Dillard, MD;  Location: [facility];  Service: Gynecology;  Laterality: N/A;  . Dilation and curettage of uterus  09/11/2010    Procedure: DILATATION AND CURETTAGE (D&C);  Surgeon: Michael LitterNaima A Dillard, MD;  Location: [facility];  Service: Gynecology;  Laterality: N/A;  . Endometrial biopsy    . Cystectomy      leg    Family History  Problem Relation Age of Onset  . Breast  cancer    . Heart attack    . Lymphoma    . Colon cancer Neg Hx     Allergies  Allergen Reactions  . Almond Oil Other (See Comments)    Throat raw and scratchy  . Other Other (See Comments)    SUTURE MATERIAL- REDNESS, IRRITATION, TOOK A LONG TIME TO HEAL    Current Outpatient Prescriptions on File Prior to Visit  Medication Sig Dispense Refill  . aspirin 81 MG tablet Take 81 mg by mouth daily.    . Calcium Carb-Cholecalciferol (CALCIUM-VITAMIN D) 500-400 MG-UNIT TABS Take by mouth daily.    [name]. ezetimibe (ZETIA) 10 MG tablet Take 1 tablet (10 mg total) by mouth daily. Repeat labs are due now 90 tablet 1  . Flaxseed, Linseed, (FLAX SEED OIL) 1000 MG CAPS Take by mouth daily.    . fluticasone (FLONASE) 50 MCG/ACT nasal spray instill 2 sprays into each nostril at bedtime 16 g 2  . meloxicam (MOBIC) 15 MG tablet take 1/2 to 1 tablet by mouth once daily if needed 30 tablet 0  . niacin (NIASPAN) 1000 MG CR tablet Take 1 tablet (1,000 mg total) by mouth at bedtime. 90  tablet 1  . potassium chloride SA (K-DUR,KLOR-CON) 20 MEQ tablet take 2 tablets by mouth once daily 60 tablet 2  . sertraline (ZOLOFT) 100 MG tablet take 1 and 1/2 tablets by mouth once daily 135 tablet 1  . simvastatin (ZOCOR) 40 MG tablet Take 1 tablet (40 mg total) by mouth at bedtime. 90 tablet 1  . trimethoprim-polymyxin b (POLYTRIM) ophthalmic solution Place 1 drop into both eyes every 4 (four) hours. 10 mL 0  . valsartan-hydrochlorothiazide (DIOVAN-HCT) 80-12.5 MG tablet take 1 tablet by mouth once daily 90 tablet 1  . vitamin A 16109 UNIT capsule Take 5,000 Units by mouth daily.    . vitamin C (ASCORBIC ACID) 250 MG tablet Take 250 mg by mouth daily.     No current facility-administered medications on file prior to visit.    BP 133/72 mmHg  Pulse 72  Temp(Src) 98.2 F (36.8 C) (Oral)  Resp 16  Ht  (1.651 m)  Wt 170 lb 6.4 oz (77.293 kg)  BMI 28.36 kg/m2  SpO2 100%       Objective:   Physical Exam    Constitutional: She is oriented to person, place, and time. She appears well-developed and well-nourished.  HENT:  Head: Normocephalic and atraumatic.  Right Ear: Tympanic membrane and ear canal normal.  Left Ear: Tympanic membrane and ear canal normal.  Mouth/Throat: No oropharyngeal exudate, posterior oropharyngeal edema or posterior oropharyngeal erythema.  Cardiovascular: Normal rate, regular rhythm and normal heart sounds.   No murmur heard. Pulmonary/Chest: Effort normal and breath sounds normal. No respiratory distress. She has no wheezes.  Musculoskeletal: She exhibits no edema.  Lymphadenopathy:    She has no cervical adenopathy.  Neurological: She is alert and oriented to person, place, and time.  Skin: Skin is warm and dry.  Psychiatric: She has a normal mood and affect. Her behavior is normal. Judgment and thought content normal.          Assessment & Plan:  Viral URI with cough- rapid strep neg. Overall sxs are improving. Advised pt as follows: Continue delsym as needed for cough.  Call if symptoms worsen, if fever >101, or if symptoms do not continue to improve.

## 2015-01-22 ENCOUNTER — Other Ambulatory Visit: Payer: Self-pay | Admitting: Family Medicine

## 2015-01-25 ENCOUNTER — Telehealth: Payer: Self-pay | Admitting: Family Medicine

## 2015-01-25 NOTE — Telephone Encounter (Signed)
Left message for patient to call about Flu Shot °

## 2015-03-23 NOTE — Telephone Encounter (Signed)
Pt had flu shot at [facility] when she was there for back surgery in 11/2014 Scheduled for AWV with RN

## 2015-03-29 ENCOUNTER — Telehealth: Payer: Self-pay | Admitting: *Deleted

## 2015-03-29 NOTE — Telephone Encounter (Signed)
Unable to reach patient at time of pre-visit call. Left message for patient to confirm appt w/ Ashlee.

## 2015-03-30 ENCOUNTER — Ambulatory Visit: Payer: Medicare Other

## 2015-05-07 ENCOUNTER — Other Ambulatory Visit: Payer: Self-pay

## 2015-05-07 DIAGNOSIS — Z1231 Encounter for screening mammogram for malignant neoplasm of breast: Secondary | ICD-10-CM

## 2015-05-18 ENCOUNTER — Other Ambulatory Visit: Payer: Self-pay

## 2015-05-18 MED ORDER — SERTRALINE HCL 100 MG PO TABS: 150.0000 mg | ORAL_TABLET | Freq: Every day | ORAL | Status: DC

## 2015-05-28 ENCOUNTER — Ambulatory Visit
Admission: RE | Admit: 2015-05-28 | Discharge: 2015-05-28 | Disposition: A | Discharge status: Home / Self Care | Payer: Medicare Other | Source: Ambulatory Visit

## 2015-05-28 DIAGNOSIS — Z1231 Encounter for screening mammogram for malignant neoplasm of breast: Secondary | ICD-10-CM

## 2015-06-01 ENCOUNTER — Encounter: Payer: Self-pay | Admitting: Family Medicine

## 2015-06-01 ENCOUNTER — Ambulatory Visit (INDEPENDENT_AMBULATORY_CARE_PROVIDER_SITE_OTHER): Payer: Medicare Other | Admitting: Family Medicine

## 2015-06-01 VITALS — BP 156/90 | HR 79 | Temp 98.5°F | Ht 65.0 in | Wt 173.2 lb

## 2015-06-01 DIAGNOSIS — R8299 Other abnormal findings in urine: Secondary | ICD-10-CM

## 2015-06-01 DIAGNOSIS — R928 Other abnormal and inconclusive findings on diagnostic imaging of breast: Secondary | ICD-10-CM | POA: Diagnosis not present

## 2015-06-01 DIAGNOSIS — Z1159 Encounter for screening for other viral diseases: Secondary | ICD-10-CM | POA: Diagnosis not present

## 2015-06-01 DIAGNOSIS — R82998 Other abnormal findings in urine: Secondary | ICD-10-CM

## 2015-06-01 DIAGNOSIS — E2839 Other primary ovarian failure: Secondary | ICD-10-CM | POA: Diagnosis not present

## 2015-06-01 DIAGNOSIS — E785 Hyperlipidemia, unspecified: Secondary | ICD-10-CM | POA: Diagnosis not present

## 2015-06-01 DIAGNOSIS — Z0000 Encounter for general adult medical examination without abnormal findings: Secondary | ICD-10-CM | POA: Diagnosis not present

## 2015-06-01 DIAGNOSIS — I10 Essential (primary) hypertension: Secondary | ICD-10-CM | POA: Diagnosis not present

## 2015-06-01 LAB — CBC WITH DIFFERENTIAL/PLATELET
BASOS PCT: 0 %
Basophils Absolute: 0 cells/uL (ref 0–200)
Eosinophils Absolute: 72 cells/uL (ref 15–500)
Eosinophils Relative: 1 %
HEMATOCRIT: 46.1 % — AB (ref 35.0–45.0)
Hemoglobin: 15.5 g/dL (ref 11.7–15.5)
LYMPHS ABS: 1656 {cells}/uL (ref 850–3900)
LYMPHS PCT: 23 %
MCH: 30.9 pg (ref 27.0–33.0)
MCHC: 33.6 g/dL (ref 32.0–36.0)
MCV: 92 fL (ref 80.0–100.0)
MONO ABS: 576 {cells}/uL (ref 200–950)
MPV: 10.7 fL (ref 7.5–12.5)
Monocytes Relative: 8 %
Neutro Abs: 4896 cells/uL (ref 1500–7800)
Neutrophils Relative %: 68 %
PLATELETS: 239 10*3/uL (ref 140–400)
RBC: 5.01 MIL/uL (ref 3.80–5.10)
RDW: 14.3 % (ref 11.0–15.0)
WBC: 7.2 10*3/uL (ref 3.8–10.8)

## 2015-06-01 LAB — LIPID PANEL
CHOL/HDL RATIO: 3.4 ratio (ref <=5.0)
Cholesterol: 252 mg/dL — ABNORMAL HIGH (ref 125–200)
HDL: 74 mg/dL (ref >=46)
LDL Cholesterol: 158 mg/dL — ABNORMAL HIGH (ref <130)
Triglycerides: 102 mg/dL (ref <150)
VLDL: 20 mg/dL (ref <30)

## 2015-06-01 LAB — COMPREHENSIVE METABOLIC PANEL
ALK PHOS: 71 U/L (ref 33–130)
ALT: 16 U/L (ref 6–29)
AST: 18 U/L (ref 10–35)
Albumin: 4.4 g/dL (ref 3.6–5.1)
BILIRUBIN TOTAL: 0.6 mg/dL (ref 0.2–1.2)
BUN: 24 mg/dL (ref 7–25)
CALCIUM: 9.8 mg/dL (ref 8.6–10.4)
CO2: 25 mmol/L (ref 20–31)
CREATININE: 0.84 mg/dL (ref 0.60–0.93)
Chloride: 102 mmol/L (ref 98–110)
GLUCOSE: 97 mg/dL (ref 65–99)
Potassium: 4.1 mmol/L (ref 3.5–5.3)
SODIUM: 142 mmol/L (ref 135–146)
Total Protein: 7.2 g/dL (ref 6.1–8.1)

## 2015-06-01 LAB — POCT URINALYSIS DIPSTICK
Bilirubin, UA: NEGATIVE
Glucose, UA: NEGATIVE
Ketones, UA: NEGATIVE
Nitrite, UA: POSITIVE
RBC UA: NEGATIVE
UROBILINOGEN UA: 0.2
pH, UA: 6

## 2015-06-01 NOTE — Progress Notes (Signed)
Pre visit review using our clinic review tool, if applicable. No additional management support is needed unless otherwise documented below in the visit note. 

## 2015-06-01 NOTE — Progress Notes (Signed)
Subjective:   Glenda Lyons is a 70 y.o. female who presents for Medicare Annual (Subsequent) preventive examination.  Review of Systems:   Review of Systems  Constitutional: Negative for activity change, appetite change and fatigue.  HENT: Negative for hearing loss, congestion, tinnitus and ear discharge.   Eyes: Negative for visual disturbance (see optho q1y -- vision corrected to 20/20 with glasses).  Respiratory: Negative for cough, chest tightness and shortness of breath.   Cardiovascular: Negative for chest pain, palpitations and leg swelling.  Gastrointestinal: Negative for abdominal pain, diarrhea, constipation and abdominal distention.  Genitourinary: Negative for urgency, frequency, decreased urine volume and difficulty urinating.  Musculoskeletal: Negative for back pain, arthralgias and gait problem.  Skin: Negative for color change, pallor and rash.  Neurological: Negative for dizziness, light-headedness, numbness and headaches.  Hematological: Negative for adenopathy. Does not bruise/bleed easily.  Psychiatric/Behavioral: Negative for suicidal ideas, confusion, sleep disturbance, self-injury, dysphoric mood, decreased concentration and agitation.  Pt is able to read and write and can do all ADLs No risk for falling No abuse/ violence in home          Objective:     Vitals: BP 156/90 mmHg  Pulse 79  Temp(Src) 98.5 F (36.9 C) (Oral)  Ht '5\' 5"'$  (1.651 m)  Wt 173 lb 3.2 oz (78.563 kg)  BMI 28.82 kg/m2  SpO2 98%  Body mass index is 28.82 kg/(m^2). BP 156/90 mmHg  Pulse 79  Temp(Src) 98.5 F (36.9 C) (Oral)  Ht '5\' 5"'$  (1.651 m)  Wt 173 lb 3.2 oz (78.563 kg)  BMI 28.82 kg/m2  SpO2 98% General appearance: alert, cooperative, appears stated age and no distress Head: Normocephalic, without obvious abnormality, atraumatic Eyes: conjunctivae/corneas clear. PERRL, EOM's intact. Fundi benign. Ears: normal TM's and external ear canals both ears Nose: Nares normal.  Septum midline. Mucosa normal. No drainage or sinus tenderness. Throat: lips, mucosa, and tongue normal; teeth and gums normal Neck: no adenopathy, no carotid bruit, no JVD, supple, symmetrical, trachea midline and thyroid not enlarged, symmetric, no tenderness/mass/nodules Back: symmetric, no curvature. ROM normal. No CVA tenderness. Lungs: clear to auscultation bilaterally Breasts: normal appearance, no masses or tenderness Heart: regular rate and rhythm, S1, S2 normal, no murmur, click, rub or gallop Abdomen: soft, non-tender; bowel sounds normal; no masses,  no organomegaly Pelvic: not indicated; post-menopausal, no abnormal Pap smears in past Extremities: extremities normal, atraumatic, no cyanosis or edema Pulses: 2+ and symmetric Skin: Skin color, texture, turgor normal. No rashes or lesions Lymph nodes: Cervical, supraclavicular, and axillary nodes normal. Neurologic: Alert and oriented X 3, normal strength and tone. Normal symmetric reflexes. Normal coordination and gait Psych- no depression, no anxiety  Tobacco History  Smoking status  . Never Smoker   Smokeless tobacco  . Never Used     Counseling given: Not Answered   Past Medical History  Diagnosis Date  . Depression   . Hypertension   . Hyperlipemia   . Osteoarthritis   . Allergy     seasonal  . Neuromuscular disorder ([place])   . Herniated disc   . Sciatica    Past Surgical History  Procedure Laterality Date  . Tonsillectomy    . Biopsy breast    . [place]ep  1994  . Hysteroscopy with resectoscope  09/11/2010    Procedure: HYSTEROSCOPY WITH RESECTOSCOPE;  Surgeon: Betsy Coder, MD;  Location: [facility];  Service: Gynecology;  Laterality: N/A;  . Dilation and curettage of uterus  09/11/2010  Procedure: DILATATION AND CURETTAGE (D&C);  Surgeon: Betsy Coder, MD;  Location: [facility];  Service: Gynecology;  Laterality: N/A;  . Endometrial biopsy    . Cystectomy      leg   Family History  Problem Relation Age of  Onset  . Breast cancer    . Heart attack    . Lymphoma    . Colon cancer Neg Hx    History  Sexual Activity  . Sexual Activity:  . Partners: Male    Outpatient Encounter Prescriptions as of 06/01/2015  Medication Sig  . aspirin 81 MG tablet Take 81 mg by mouth daily.  . Calcium Carb-Cholecalciferol (CALCIUM-VITAMIN D) 500-400 MG-UNIT TABS Take by mouth daily.  . Flaxseed, Linseed, (FLAX SEED OIL) 1000 MG CAPS Take by mouth daily.  . fluticasone (FLONASE) 50 MCG/ACT nasal spray instill 2 sprays into each nostril at bedtime  . meloxicam (MOBIC) 15 MG tablet take 1/2 to 1 tablet by mouth once daily if needed  . sertraline (ZOLOFT) 100 MG tablet Take 1.5 tablets (150 mg total) by mouth daily. Office visit due with PCP  . trimethoprim-polymyxin b (POLYTRIM) ophthalmic solution Place 1 drop into both eyes every 4 (four) hours.  . valsartan-hydrochlorothiazide (DIOVAN-HCT) 80-12.5 MG tablet take 1 tablet by mouth once daily  . vitamin A 10000 UNIT capsule Take 5,000 Units by mouth daily.  . vitamin C (ASCORBIC ACID) 250 MG tablet Take 250 mg by mouth daily.  [name] ezetimibe (ZETIA) 10 MG tablet Take 1 tablet (10 mg total) by mouth daily. Repeat labs are due now (Patient not taking: Reported on 06/01/2015)  . niacin (NIASPAN) 1000 MG CR tablet Take 1 tablet (1,000 mg total) by mouth at bedtime. (Patient not taking: Reported on 06/01/2015)  . potassium chloride SA (K-DUR,KLOR-CON) 20 MEQ tablet Take 2 tablets (40 mEq total) by mouth daily. Repeat labs are due now (Patient not taking: Reported on 06/01/2015)  . simvastatin (ZOCOR) 40 MG tablet Take 1 tablet (40 mg total) by mouth at bedtime. (Patient not taking: Reported on 06/01/2015)   No facility-administered encounter medications on file as of 06/01/2015.    Activities of Daily Living In your present state of health, do you have any difficulty performing the following activities: 06/01/2015 06/01/2015  Hearing? N N  Vision? N N  Difficulty  concentrating or making decisions? N N  Walking or climbing stairs? N N  Dressing or bathing? N N  Doing errands, shopping? - N    Patient Care Team: [name], DO as PCP - General Jolyn Nap, MD as Referring Physician (Ophthalmology) Almedia Balls, MD as Consulting Physician (Orthopedic Surgery) Amy Carles Collet, MD as Consulting Physician (Dermatology)    Assessment:    cpe Exercise Activities and Dietary recommendations Current Exercise Habits: Home exercise routine, Type of exercise: walking, Time (Minutes): 15, Frequency (Times/Week): 3, Weekly Exercise (Minutes/Week): 45, Intensity: Mild, Exercise limited by: None identified;orthopedic condition(s)  Goals    None     Fall Risk Fall Risk  06/01/2015 06/01/2015 09/22/2013  Falls in the past year? No No No   Depression Screen PHQ 2/9 Scores 06/01/2015 06/01/2015 09/22/2013  PHQ - 2 Score 0 0 1     Cognitive Testing mmse 30/30  Immunization History  Administered Date(s) Administered  . Influenza Split 12/11/2011  . Influenza Whole 09/19/2008  . Influenza, High Dose Seasonal PF 11/09/2012, 01/08/2014  . Influenza-Unspecified 11/11/2014  . Pneumococcal Conjugate-13 09/22/2013  . Pneumococcal Polysaccharide-23 03/13/2010  . Td 03/13/2010  Screening Tests Health Maintenance  Topic Date Due  . INFLUENZA VACCINE  09/11/2015  . MAMMOGRAM  05/27/2017  . TETANUS/TDAP  03/13/2020  . COLONOSCOPY  06/16/2023  . DEXA SCAN  Completed  . ZOSTAVAX  Completed  . Hepatitis C Screening  Completed  . PNA vac Low Risk Adult  Completed      Plan:     See AVS Check labs During the course of the visit the patient was educated and counseled about the following appropriate screening and preventive services:   Vaccines to include Pneumoccal, Influenza, Hepatitis B, Td, Zostavax, HCV  Electrocardiogram  Cardiovascular Disease  Colorectal cancer screening  Bone density screening  Diabetes screening  Glaucoma  screening  Mammography/PAP  Nutrition counseling   Patient Instructions (the written plan) was given to the patient.  1. Abnormal mammogram  - MM DIAG BREAST TOMO UNI RIGHT; Future - US BREAST LTD UNI RIGHT INC AXILLA; Future  2. Hyperlipidemia Check labs con't zetia and niacin - POCT urinalysis dipstick - Lipid panel - CBC w/Diff - Comp Met (CMET)  3. Essential hypertension   - POCT urinalysis dipstick - Lipid panel - CBC w/Diff - Comp Met (CMET)  4. Need for hepatitis C screening test   - Hepatitis C antibody  5. Preventative health care See above - Lipid panel - CBC w/Diff - Comp Met (CMET)  6. Estrogen deficiency   - DG Bone Density; Future  7. Routine history and physical examination of adult    8. Other abnormal findings in urine   - Urine culture  [name], DO  06/03/2015

## 2015-06-01 NOTE — Patient Instructions (Addendum)
Preventive Care for Adults, Female A healthy lifestyle and preventive care can promote health and wellness. Preventive health guidelines for women include the following key practices.  A routine yearly physical is a good way to check with your health care provider about your health and preventive screening. It is a chance to share any concerns and updates on your health and to receive a thorough exam.  Visit your dentist for a routine exam and preventive care every 6 months. Brush your teeth twice a day and floss once a day. Good oral hygiene prevents tooth decay and gum disease.  The frequency of eye exams is based on your age, health, family medical history, use of contact lenses, and other factors. Follow your health care provider's recommendations for frequency of eye exams.  Eat a healthy diet. Foods like vegetables, fruits, whole grains, low-fat dairy products, and lean protein foods contain the nutrients you need without too many calories. Decrease your intake of foods high in solid fats, added sugars, and salt. Eat the right amount of calories for you.Get information about a proper diet from your health care provider, if necessary.  Regular physical exercise is one of the most important things you can do for your health. Most adults should get at least 150 minutes of moderate-intensity exercise (any activity that increases your heart rate and causes you to sweat) each week. In addition, most adults need muscle-strengthening exercises on 2 or more days a week.  Maintain a healthy weight. The body mass index (BMI) is a screening tool to identify possible weight problems. It provides an estimate of body fat based on height and weight. Your health care provider can find your BMI and can help you achieve or maintain a healthy weight.For adults 20 years and older:  A BMI below 18.5 is considered underweight.  A BMI of 18.5 to 24.9 is normal.  A BMI of 25 to 29.9 is considered overweight.  A  BMI of 30 and above is considered obese.  Maintain normal blood lipids and cholesterol levels by exercising and minimizing your intake of saturated fat. Eat a balanced diet with plenty of fruit and vegetables. Blood tests for lipids and cholesterol should begin at age 45 and be repeated every 5 years. If your lipid or cholesterol levels are high, you are over 50, or you are at high risk for heart disease, you may need your cholesterol levels checked more frequently.Ongoing high lipid and cholesterol levels should be treated with medicines if diet and exercise are not working.  If you smoke, find out from your health care provider how to quit. If you do not use tobacco, do not start.  Lung cancer screening is recommended for adults aged 45-80 years who are at high risk for developing lung cancer because of a history of smoking. A yearly low-dose CT scan of the lungs is recommended for people who have at least a 30-pack-year history of smoking and are a current smoker or have quit within the past 15 years. A pack year of smoking is smoking an average of 1 pack of cigarettes a day for 1 year (for example: 1 pack a day for 30 years or 2 packs a day for 15 years). Yearly screening should continue until the smoker has stopped smoking for at least 15 years. Yearly screening should be stopped for people who develop a health problem that would prevent them from having lung cancer treatment.  If you are pregnant, do not drink alcohol. If you are  breastfeeding, be very cautious about drinking alcohol. If you are not pregnant and choose to drink alcohol, do not have more than 1 drink per day. One drink is considered to be 12 ounces (355 mL) of beer, 5 ounces (148 mL) of wine, or 1.5 ounces (44 mL) of liquor.  Avoid use of street drugs. Do not share needles with anyone. Ask for help if you need support or instructions about stopping the use of drugs.  High blood pressure causes heart disease and increases the risk  of stroke. Your blood pressure should be checked at least every 1 to 2 years. Ongoing high blood pressure should be treated with medicines if weight loss and exercise do not work.  If you are 55-79 years old, ask your health care provider if you should take aspirin to prevent strokes.  Diabetes screening is done by taking a blood sample to check your blood glucose level after you have not eaten for a certain period of time (fasting). If you are not overweight and you do not have risk factors for diabetes, you should be screened once every 3 years starting at age 45. If you are overweight or obese and you are 40-70 years of age, you should be screened for diabetes every year as part of your cardiovascular risk assessment.  Breast cancer screening is essential preventive care for women. You should practice "breast self-awareness." This means understanding the normal appearance and feel of your breasts and may include breast self-examination. Any changes detected, no matter how small, should be reported to a health care provider. Women in their 20s and 30s should have a clinical breast exam (CBE) by a health care provider as part of a regular health exam every 1 to 3 years. After age 40, women should have a CBE every year. Starting at age 40, women should consider having a mammogram (breast X-ray test) every year. Women who have a family history of breast cancer should talk to their health care provider about genetic screening. Women at a high risk of breast cancer should talk to their health care providers about having an MRI and a mammogram every year.  Breast cancer gene (BRCA)-related cancer risk assessment is recommended for women who have family members with BRCA-related cancers. BRCA-related cancers include breast, ovarian, tubal, and peritoneal cancers. Having family members with these cancers may be associated with an increased risk for harmful changes (mutations) in the breast cancer genes BRCA1 and  BRCA2. Results of the assessment will determine the need for genetic counseling and BRCA1 and BRCA2 testing.  Your health care provider may recommend that you be screened regularly for cancer of the pelvic organs (ovaries, uterus, and vagina). This screening involves a pelvic examination, including checking for microscopic changes to the surface of your cervix (Pap test). You may be encouraged to have this screening done every 3 years, beginning at age 21.  For women ages 30-65, health care providers may recommend pelvic exams and Pap testing every 3 years, or they may recommend the Pap and pelvic exam, combined with testing for human papilloma virus (HPV), every 5 years. Some types of HPV increase your risk of cervical cancer. Testing for HPV may also be done on women of any age with unclear Pap test results.  Other health care providers may not recommend any screening for nonpregnant women who are considered low risk for pelvic cancer and who do not have symptoms. Ask your health care provider if a screening pelvic exam is right for   you.  If you have had past treatment for cervical cancer or a condition that could lead to cancer, you need Pap tests and screening for cancer for at least 20 years after your treatment. If Pap tests have been discontinued, your risk factors (such as having a new sexual partner) need to be reassessed to determine if screening should resume. Some women have medical problems that increase the chance of getting cervical cancer. In these cases, your health care provider may recommend more frequent screening and Pap tests.  Colorectal cancer can be detected and often prevented. Most routine colorectal cancer screening begins at the age of 50 years and continues through age 75 years. However, your health care provider may recommend screening at an earlier age if you have risk factors for colon cancer. On a yearly basis, your health care provider may provide home test kits to check  for hidden blood in the stool. Use of a small camera at the end of a tube, to directly examine the colon (sigmoidoscopy or colonoscopy), can detect the earliest forms of colorectal cancer. Talk to your health care provider about this at age 50, when routine screening begins. Direct exam of the colon should be repeated every 5-10 years through age 75 years, unless early forms of precancerous polyps or small growths are found.  People who are at an increased risk for hepatitis B should be screened for this virus. You are considered at high risk for hepatitis B if:  You were born in a country where hepatitis B occurs often. Talk with your health care provider about which countries are considered high risk.  Your parents were born in a high-risk country and you have not received a shot to protect against hepatitis B (hepatitis B vaccine).  You have HIV or AIDS.  You use needles to inject street drugs.  You live with, or have sex with, someone who has hepatitis B.  You get hemodialysis treatment.  You take certain medicines for conditions like cancer, organ transplantation, and autoimmune conditions.  Hepatitis C blood testing is recommended for all people born from 1945 through 1965 and any individual with known risks for hepatitis C.  Practice safe sex. Use condoms and avoid high-risk sexual practices to reduce the spread of sexually transmitted infections (STIs). STIs include gonorrhea, chlamydia, syphilis, trichomonas, herpes, HPV, and human immunodeficiency virus (HIV). Herpes, HIV, and HPV are viral illnesses that have no cure. They can result in disability, cancer, and death.  You should be screened for sexually transmitted illnesses (STIs) including gonorrhea and chlamydia if:  You are sexually active and are younger than 24 years.  You are older than 24 years and your health care provider tells you that you are at risk for this type of infection.  Your sexual activity has changed  since you were last screened and you are at an increased risk for chlamydia or gonorrhea. Ask your health care provider if you are at risk.  If you are at risk of being infected with HIV, it is recommended that you take a prescription medicine daily to prevent HIV infection. This is called preexposure prophylaxis (PrEP). You are considered at risk if:  You are sexually active and do not regularly use condoms or know the HIV status of your partner(s).  You take drugs by injection.  You are sexually active with a partner who has HIV.  Talk with your health care provider about whether you are at high risk of being infected with HIV. If   you choose to begin PrEP, you should first be tested for HIV. You should then be tested every 3 months for as long as you are taking PrEP.  Osteoporosis is a disease in which the bones lose minerals and strength with aging. This can result in serious bone fractures or breaks. The risk of osteoporosis can be identified using a bone density scan. Women ages 67 years and over and women at risk for fractures or osteoporosis should discuss screening with their health care providers. Ask your health care provider whether you should take a calcium supplement or vitamin D to reduce the rate of osteoporosis.  Menopause can be associated with physical symptoms and risks. Hormone replacement therapy is available to decrease symptoms and risks. You should talk to your health care provider about whether hormone replacement therapy is right for you.  Use sunscreen. Apply sunscreen liberally and repeatedly throughout the day. You should seek shade when your shadow is shorter than you. Protect yourself by wearing long sleeves, pants, a wide-brimmed hat, and sunglasses year round, whenever you are outdoors.  Once a month, do a whole body skin exam, using a mirror to look at the skin on your back. Tell your health care provider of new moles, moles that have irregular borders, moles that  are larger than a pencil eraser, or moles that have changed in shape or color.  Stay current with required vaccines (immunizations).  Influenza vaccine. All adults should be immunized every year.  Tetanus, diphtheria, and acellular pertussis (Td, Tdap) vaccine. Pregnant women should receive 1 dose of Tdap vaccine during each pregnancy. The dose should be obtained regardless of the length of time since the last dose. Immunization is preferred during the 27th-36th week of gestation. An adult who has not previously received Tdap or who does not know her vaccine status should receive 1 dose of Tdap. This initial dose should be followed by tetanus and diphtheria toxoids (Td) booster doses every 10 years. Adults with an unknown or incomplete history of completing a 3-dose immunization series with Td-containing vaccines should begin or complete a primary immunization series including a Tdap dose. Adults should receive a Td booster every 10 years.  Varicella vaccine. An adult without evidence of immunity to varicella should receive 2 doses or a second dose if she has previously received 1 dose. Pregnant females who do not have evidence of immunity should receive the first dose after pregnancy. This first dose should be obtained before leaving the health care facility. The second dose should be obtained 4-8 weeks after the first dose.  Human papillomavirus (HPV) vaccine. Females aged 13-26 years who have not received the vaccine previously should obtain the 3-dose series. The vaccine is not recommended for use in pregnant females. However, pregnancy testing is not needed before receiving a dose. If a female is found to be pregnant after receiving a dose, no treatment is needed. In that case, the remaining doses should be delayed until after the pregnancy. Immunization is recommended for any person with an immunocompromised condition through the age of 61 years if she did not get any or all doses earlier. During the  3-dose series, the second dose should be obtained 4-8 weeks after the first dose. The third dose should be obtained 24 weeks after the first dose and 16 weeks after the second dose.  Zoster vaccine. One dose is recommended for adults aged 30 years or older unless certain conditions are present.  Measles, mumps, and rubella (MMR) vaccine. Adults born  before 1957 generally are considered immune to measles and mumps. Adults born in 1957 or later should have 1 or more doses of MMR vaccine unless there is a contraindication to the vaccine or there is laboratory evidence of immunity to each of the three diseases. A routine second dose of MMR vaccine should be obtained at least 28 days after the first dose for students attending postsecondary schools, health care workers, or international travelers. People who received inactivated measles vaccine or an unknown type of measles vaccine during 1963-1967 should receive 2 doses of MMR vaccine. People who received inactivated mumps vaccine or an unknown type of mumps vaccine before 1979 and are at high risk for mumps infection should consider immunization with 2 doses of MMR vaccine. For females of childbearing age, rubella immunity should be determined. If there is no evidence of immunity, females who are not pregnant should be vaccinated. If there is no evidence of immunity, females who are pregnant should delay immunization until after pregnancy. Unvaccinated health care workers born before 1957 who lack laboratory evidence of measles, mumps, or rubella immunity or laboratory confirmation of disease should consider measles and mumps immunization with 2 doses of MMR vaccine or rubella immunization with 1 dose of MMR vaccine.  Pneumococcal 13-valent conjugate (PCV13) vaccine. When indicated, a person who is uncertain of his immunization history and has no record of immunization should receive the PCV13 vaccine. All adults 65 years of age and older should receive this  vaccine. An adult aged 19 years or older who has certain medical conditions and has not been previously immunized should receive 1 dose of PCV13 vaccine. This PCV13 should be followed with a dose of pneumococcal polysaccharide (PPSV23) vaccine. Adults who are at high risk for pneumococcal disease should obtain the PPSV23 vaccine at least 8 weeks after the dose of PCV13 vaccine. Adults older than 71 years of age who have normal immune system function should obtain the PPSV23 vaccine dose at least 1 year after the dose of PCV13 vaccine.  Pneumococcal polysaccharide (PPSV23) vaccine. When PCV13 is also indicated, PCV13 should be obtained first. All adults aged 65 years and older should be immunized. An adult younger than age 65 years who has certain medical conditions should be immunized. Any person who resides in a nursing home or long-term care facility should be immunized. An adult smoker should be immunized. People with an immunocompromised condition and certain other conditions should receive both PCV13 and PPSV23 vaccines. People with human immunodeficiency virus (HIV) infection should be immunized as soon as possible after diagnosis. Immunization during chemotherapy or radiation therapy should be avoided. Routine use of PPSV23 vaccine is not recommended for American Indians, [place] Natives, or people younger than 65 years unless there are medical conditions that require PPSV23 vaccine. When indicated, people who have unknown immunization and have no record of immunization should receive PPSV23 vaccine. One-time revaccination 5 years after the first dose of PPSV23 is recommended for people aged 19-64 years who have chronic kidney failure, nephrotic syndrome, asplenia, or immunocompromised conditions. People who received 1-2 doses of PPSV23 before age 65 years should receive another dose of PPSV23 vaccine at age 65 years or later if at least 5 years have passed since the previous dose. Doses of PPSV23 are not  needed for people immunized with PPSV23 at or after age 65 years.  Meningococcal vaccine. Adults with asplenia or persistent complement component deficiencies should receive 2 doses of quadrivalent meningococcal conjugate (MenACWY-D) vaccine. The doses should be obtained   at least 2 months apart. Microbiologists working with certain meningococcal bacteria, [place] recruits, people at risk during an outbreak, and people who travel to or live in countries with a high rate of meningitis should be immunized. A first-year college student up through age 34 years who is living in a residence hall should receive a dose if she did not receive a dose on or after her 16th birthday. Adults who have certain high-risk conditions should receive one or more doses of vaccine.  Hepatitis A vaccine. Adults who wish to be protected from this disease, have certain high-risk conditions, work with hepatitis A-infected animals, work in hepatitis A research labs, or travel to or work in countries with a high rate of hepatitis A should be immunized. Adults who were previously unvaccinated and who anticipate close contact with an international adoptee during the first 60 days after arrival in the [place] from a country with a high rate of hepatitis A should be immunized.  Hepatitis B vaccine. Adults who wish to be protected from this disease, have certain high-risk conditions, may be exposed to blood or other infectious body fluids, are household contacts or sex partners of hepatitis B positive people, are clients or workers in certain care facilities, or travel to or work in countries with a high rate of hepatitis B should be immunized.  Haemophilus influenzae type b (Hib) vaccine. A previously unvaccinated person with asplenia or sickle cell disease or having a scheduled splenectomy should receive 1 dose of Hib vaccine. Regardless of previous immunization, a recipient of a hematopoietic stem cell transplant should receive a  3-dose series 6-12 months after her successful transplant. Hib vaccine is not recommended for adults with HIV infection. Preventive Services / Frequency Ages 35 to 4 years  Blood pressure check.** / Every 3-5 years.  Lipid and cholesterol check.** / Every 5 years beginning at age 60.  Clinical breast exam.** / Every 3 years for women in their 71s and 10s.  BRCA-related cancer risk assessment.** / For women who have family members with a BRCA-related cancer (breast, ovarian, tubal, or peritoneal cancers).  Pap test.** / Every 2 years from ages 76 through 26. Every 3 years starting at age 61 through age 76 or 93 with a history of 3 consecutive normal Pap tests.  HPV screening.** / Every 3 years from ages 37 through ages 60 to 51 with a history of 3 consecutive normal Pap tests.  Hepatitis C blood test.** / For any individual with known risks for hepatitis C.  Skin self-exam. / Monthly.  Influenza vaccine. / Every year.  Tetanus, diphtheria, and acellular pertussis (Tdap, Td) vaccine.** / Consult your health care provider. Pregnant women should receive 1 dose of Tdap vaccine during each pregnancy. 1 dose of Td every 10 years.  Varicella vaccine.** / Consult your health care provider. Pregnant females who do not have evidence of immunity should receive the first dose after pregnancy.  HPV vaccine. / 3 doses over 6 months, if 93 and younger. The vaccine is not recommended for use in pregnant females. However, pregnancy testing is not needed before receiving a dose.  Measles, mumps, rubella (MMR) vaccine.** / You need at least 1 dose of MMR if you were born in 1957 or later. You may also need a 2nd dose. For females of childbearing age, rubella immunity should be determined. If there is no evidence of immunity, females who are not pregnant should be vaccinated. If there is no evidence of immunity, females who are  pregnant should delay immunization until after pregnancy.  Pneumococcal  13-valent conjugate (PCV13) vaccine.** / Consult your health care provider.  Pneumococcal polysaccharide (PPSV23) vaccine.** / 1 to 2 doses if you smoke cigarettes or if you have certain conditions.  Meningococcal vaccine.** / 1 dose if you are age 68 to 8 years and a Market researcher living in a residence hall, or have one of several medical conditions, you need to get vaccinated against meningococcal disease. You may also need additional booster doses.  Hepatitis A vaccine.** / Consult your health care provider.  Hepatitis B vaccine.** / Consult your health care provider.  Haemophilus influenzae type b (Hib) vaccine.** / Consult your health care provider. Ages 7 to 53 years  Blood pressure check.** / Every year.  Lipid and cholesterol check.** / Every 5 years beginning at age 25 years.  Lung cancer screening. / Every year if you are aged 11-80 years and have a 30-pack-year history of smoking and currently smoke or have quit within the past 15 years. Yearly screening is stopped once you have quit smoking for at least 15 years or develop a health problem that would prevent you from having lung cancer treatment.  Clinical breast exam.** / Every year after age 48 years.  BRCA-related cancer risk assessment.** / For women who have family members with a BRCA-related cancer (breast, ovarian, tubal, or peritoneal cancers).  Mammogram.** / Every year beginning at age 41 years and continuing for as long as you are in good health. Consult with your health care provider.  Pap test.** / Every 3 years starting at age 65 years through age 37 or 70 years with a history of 3 consecutive normal Pap tests.  HPV screening.** / Every 3 years from ages 72 years through ages 60 to 40 years with a history of 3 consecutive normal Pap tests.  Fecal occult blood test (FOBT) of stool. / Every year beginning at age 21 years and continuing until age 5 years. You may not need to do this test if you get  a colonoscopy every 10 years.  Flexible sigmoidoscopy or colonoscopy.** / Every 5 years for a flexible sigmoidoscopy or every 10 years for a colonoscopy beginning at age 35 years and continuing until age 48 years.  Hepatitis C blood test.** / For all people born from [birth year] through 1965 and any individual with known risks for hepatitis C.  Skin self-exam. / Monthly.  Influenza vaccine. / Every year.  Tetanus, diphtheria, and acellular pertussis (Tdap/Td) vaccine.** / Consult your health care provider. Pregnant women should receive 1 dose of Tdap vaccine during each pregnancy. 1 dose of Td every 10 years.  Varicella vaccine.** / Consult your health care provider. Pregnant females who do not have evidence of immunity should receive the first dose after pregnancy.  Zoster vaccine.** / 1 dose for adults aged 30 years or older.  Measles, mumps, rubella (MMR) vaccine.** / You need at least 1 dose of MMR if you were born in 1957 or later. You may also need a second dose. For females of childbearing age, rubella immunity should be determined. If there is no evidence of immunity, females who are not pregnant should be vaccinated. If there is no evidence of immunity, females who are pregnant should delay immunization until after pregnancy.  Pneumococcal 13-valent conjugate (PCV13) vaccine.** / Consult your health care provider.  Pneumococcal polysaccharide (PPSV23) vaccine.** / 1 to 2 doses if you smoke cigarettes or if you have certain conditions.  Meningococcal vaccine.** /  Consult your health care provider.  Hepatitis A vaccine.** / Consult your health care provider.  Hepatitis B vaccine.** / Consult your health care provider.  Haemophilus influenzae type b (Hib) vaccine.** / Consult your health care provider. Ages 22 years and over  Blood pressure check.** / Every year.  Lipid and cholesterol check.** / Every 5 years beginning at age 14 years.  Lung cancer screening. / Every year if you  are aged 55-80 years and have a 30-pack-year history of smoking and currently smoke or have quit within the past 15 years. Yearly screening is stopped once you have quit smoking for at least 15 years or develop a health problem that would prevent you from having lung cancer treatment.  Clinical breast exam.** / Every year after age 63 years.  BRCA-related cancer risk assessment.** / For women who have family members with a BRCA-related cancer (breast, ovarian, tubal, or peritoneal cancers).  Mammogram.** / Every year beginning at age 7 years and continuing for as long as you are in good health. Consult with your health care provider.  Pap test.** / Every 3 years starting at age 45 years through age 85 or 14 years with 3 consecutive normal Pap tests. Testing can be stopped between 65 and 70 years with 3 consecutive normal Pap tests and no abnormal Pap or HPV tests in the past 10 years.  HPV screening.** / Every 3 years from ages 26 years through ages 7 or 36 years with a history of 3 consecutive normal Pap tests. Testing can be stopped between 65 and 70 years with 3 consecutive normal Pap tests and no abnormal Pap or HPV tests in the past 10 years.  Fecal occult blood test (FOBT) of stool. / Every year beginning at age 3 years and continuing until age 52 years. You may not need to do this test if you get a colonoscopy every 10 years.  Flexible sigmoidoscopy or colonoscopy.** / Every 5 years for a flexible sigmoidoscopy or every 10 years for a colonoscopy beginning at age 44 years and continuing until age 40 years.  Hepatitis C blood test.** / For all people born from [birth year] through 1966 and any individual with known risks for hepatitis C.  Osteoporosis screening.** / A one-time screening for women ages 30 years and over and women at risk for fractures or osteoporosis.  Skin self-exam. / Monthly.  Influenza vaccine. / Every year.  Tetanus, diphtheria, and acellular pertussis (Tdap/Td)  vaccine.** / 1 dose of Td every 10 years.  Varicella vaccine.** / Consult your health care provider.  Zoster vaccine.** / 1 dose for adults aged 34 years or older.  Pneumococcal 13-valent conjugate (PCV13) vaccine.** / Consult your health care provider.  Pneumococcal polysaccharide (PPSV23) vaccine.** / 1 dose for all adults aged 75 years and older.  Meningococcal vaccine.** / Consult your health care provider.  Hepatitis A vaccine.** / Consult your health care provider.  Hepatitis B vaccine.** / Consult your health care provider.  Haemophilus influenzae type b (Hib) vaccine.** / Consult your health care provider. ** Family history and personal history of risk and conditions may change your health care provider's recommendations.   This information is not intended to replace advice given to you by your health care provider. Make sure you discuss any questions you have with your health care provider.   Document Released: 03/25/2001 Document Revised: 02/17/2014 Document Reviewed: 06/24/2010 Elsevier Interactive Patient Education 2016 [employer]. 0.

## 2015-06-02 LAB — HEPATITIS C ANTIBODY: HCV AB: NEGATIVE

## 2015-06-04 LAB — URINE CULTURE

## 2015-06-07 ENCOUNTER — Ambulatory Visit
Admission: RE | Admit: 2015-06-07 | Discharge: 2015-06-07 | Disposition: A | Discharge status: Home / Self Care | Payer: Medicare Other | Source: Ambulatory Visit | Attending: Family Medicine | Admitting: Family Medicine

## 2015-06-07 DIAGNOSIS — R928 Other abnormal and inconclusive findings on diagnostic imaging of breast: Secondary | ICD-10-CM

## 2015-06-08 ENCOUNTER — Telehealth: Payer: Self-pay | Admitting: *Deleted

## 2015-06-08 MED ORDER — CIPROFLOXACIN HCL 250 MG PO TABS: 250.0000 mg | ORAL_TABLET | Freq: Two times a day (BID) | ORAL | Status: DC

## 2015-06-08 NOTE — Telephone Encounter (Signed)
LMOM with contact name and number RE: UTI and Antibiotic to pharmacy per provider instructions; we will call back with further instructions on cholesterol on Monday/SLS 04/28

## 2015-06-08 NOTE — Telephone Encounter (Signed)
-----   Message from Donato SchultzYvonne R Lowne Chase, DO sent at 06/05/2015 10:37 PM EDT ----- + UTI--- cipro 250 mg bid x 3 days Cholesterol--- LDL goal < 100,  HDL >40,  TG < 150.  Diet and exercise will increase HDL and decrease LDL and TG.  Fish,  Fish Oil, Flaxseed oil will also help increase the HDL and decrease Triglycerides.   Recheck labs in 3 months---- is pt taking zocor and zetia If yes , change zocor to Lipitor 20 mg every night   2 refills Lipid, cmp.

## 2015-06-13 NOTE — Progress Notes (Signed)
Quick Note:  Called the patient at and the line was busy [phone] (Home) *Preferred*. Message sent. KP ______

## 2015-06-19 ENCOUNTER — Telehealth: Payer: Self-pay | Admitting: Family Medicine

## 2015-06-19 DIAGNOSIS — E785 Hyperlipidemia, unspecified: Secondary | ICD-10-CM

## 2015-06-19 NOTE — Telephone Encounter (Signed)
Can be reached: [phone]   Reason for call: pt not having sx of UTI. Completed abx. Pt states she was waiting for call about cholesterol?

## 2015-06-19 NOTE — Telephone Encounter (Signed)
Message left to call the office.    KP 

## 2015-06-19 NOTE — Telephone Encounter (Signed)
Pt called in stating that she is [facility] the [facility].   CB: [phone]

## 2015-06-20 NOTE — Telephone Encounter (Signed)
Spoke with patient and she is not taking Zetia, Niaspan or the Zocor for at least 3 month, she said this was discussed, she wanted to know if you still wanted her to change to the Lipitor. She said she is also nit taking the potassium and wanted to know should she re-start the potassium since her lab was normal.      KP

## 2015-06-21 MED ORDER — SIMVASTATIN 40 MG PO TABS: 40.0000 mg | ORAL_TABLET | Freq: Every day | ORAL | Status: DC

## 2015-06-21 NOTE — Telephone Encounter (Signed)
Patient has been notified, and will hold the potassium and lab apt has been scheduled for 3 months

## 2015-06-21 NOTE — Telephone Encounter (Signed)
She can hold off on potassium now Restart zocor --- if she did not have side effect from it Recheck 3 months

## 2015-06-26 ENCOUNTER — Other Ambulatory Visit: Payer: Self-pay | Admitting: Family Medicine

## 2015-06-29 ENCOUNTER — Telehealth: Payer: Self-pay | Admitting: *Deleted

## 2015-06-29 MED ORDER — VALSARTAN-HYDROCHLOROTHIAZIDE 80-12.5 MG PO TABS: 1.0000 | ORAL_TABLET | Freq: Every day | ORAL | Status: DC

## 2015-06-29 NOTE — Telephone Encounter (Signed)
Refill request received via fax. Medication filled to pharmacy as requested.

## 2015-07-05 ENCOUNTER — Other Ambulatory Visit: Payer: Self-pay

## 2015-09-19 ENCOUNTER — Telehealth: Payer: Self-pay | Admitting: *Deleted

## 2015-09-19 NOTE — Telephone Encounter (Signed)
Pt is on nurse schedule for BP check on Friday 09/21/15 at 9:15, however, she needs follow-up w/ PCP for HTN per last AVS. PCP has a 9:00 opening that day. Called pt to see if she could come in at 9:00 for appt w/ PCP and left message to return call.

## 2015-09-20 NOTE — Telephone Encounter (Signed)
Appt rescheduled w/ PCP by clinic scheduler.

## 2015-09-21 ENCOUNTER — Ambulatory Visit (INDEPENDENT_AMBULATORY_CARE_PROVIDER_SITE_OTHER): Payer: Medicare Other | Admitting: Family Medicine

## 2015-09-21 ENCOUNTER — Encounter: Payer: Self-pay | Admitting: Family Medicine

## 2015-09-21 ENCOUNTER — Other Ambulatory Visit: Payer: Medicare Other

## 2015-09-21 VITALS — BP 140/84 | HR 62 | Temp 97.7°F | Wt 158.8 lb

## 2015-09-21 DIAGNOSIS — F32A Depression, unspecified: Secondary | ICD-10-CM

## 2015-09-21 DIAGNOSIS — E785 Hyperlipidemia, unspecified: Secondary | ICD-10-CM

## 2015-09-21 DIAGNOSIS — F329 Major depressive disorder, single episode, unspecified: Secondary | ICD-10-CM

## 2015-09-21 DIAGNOSIS — M5440 Lumbago with sciatica, unspecified side: Secondary | ICD-10-CM

## 2015-09-21 DIAGNOSIS — I10 Essential (primary) hypertension: Secondary | ICD-10-CM | POA: Diagnosis not present

## 2015-09-21 LAB — COMPREHENSIVE METABOLIC PANEL
ALT: 20 U/L (ref 0–35)
AST: 20 U/L (ref 0–37)
Albumin: 4.6 g/dL (ref 3.5–5.2)
Alkaline Phosphatase: 62 U/L (ref 39–117)
BUN: 21 mg/dL (ref 6–23)
CO2: 29 meq/L (ref 19–32)
CREATININE: 0.82 mg/dL (ref 0.40–1.20)
Calcium: 10.1 mg/dL (ref 8.4–10.5)
Chloride: 103 mEq/L (ref 96–112)
GFR: 73.11 mL/min (ref >60.00)
GLUCOSE: 99 mg/dL (ref 70–99)
Potassium: 3.4 mEq/L — ABNORMAL LOW (ref 3.5–5.1)
Sodium: 140 mEq/L (ref 135–145)
Total Bilirubin: 0.7 mg/dL (ref 0.2–1.2)
Total Protein: 7.6 g/dL (ref 6.0–8.3)

## 2015-09-21 LAB — LIPID PANEL
Cholesterol: 168 mg/dL (ref 0–200)
HDL: 61.3 mg/dL (ref >39.00)
LDL CALC: 89 mg/dL (ref 0–99)
NonHDL: 106.56
TRIGLYCERIDES: 86 mg/dL (ref 0.0–149.0)
Total CHOL/HDL Ratio: 3
VLDL: 17.2 mg/dL (ref 0.0–40.0)

## 2015-09-21 MED ORDER — SIMVASTATIN 40 MG PO TABS: 40.0000 mg | ORAL_TABLET | Freq: Every day | ORAL | 2 refills | Status: DC

## 2015-09-21 MED ORDER — VALSARTAN-HYDROCHLOROTHIAZIDE 80-12.5 MG PO TABS: 1.0000 | ORAL_TABLET | Freq: Every day | ORAL | 1 refills | Status: DC

## 2015-09-21 MED ORDER — SERTRALINE HCL 100 MG PO TABS: 150.0000 mg | ORAL_TABLET | Freq: Every day | ORAL | 5 refills | Status: DC

## 2015-09-21 MED ORDER — MELOXICAM 15 MG PO TABS: ORAL_TABLET | ORAL | 0 refills | Status: DC

## 2015-09-21 NOTE — Assessment & Plan Note (Signed)
con't zocor Check lab

## 2015-09-21 NOTE — Assessment & Plan Note (Signed)
con't diovan hct rto 3 months or sooner prn  stable

## 2015-09-21 NOTE — Patient Instructions (Signed)
DASH Eating Plan  DASH stands for "Dietary Approaches to Stop Hypertension." The DASH eating plan is a healthy eating plan that has been shown to reduce high blood pressure (hypertension). Additional health benefits may include reducing the risk of type 2 diabetes mellitus, heart disease, and stroke. The DASH eating plan may also help with weight loss.  WHAT DO I NEED TO KNOW ABOUT THE DASH EATING PLAN?  For the DASH eating plan, you will follow these general guidelines:  · Choose foods with a percent daily value for sodium of less than 5% (as listed on the food label).  · Use salt-free seasonings or herbs instead of table salt or sea salt.  · Check with your health care provider or pharmacist before using salt substitutes.  · Eat lower-sodium products, often labeled as "lower sodium" or "no salt added."  · Eat fresh foods.  · Eat more vegetables, fruits, and low-fat dairy products.  · Choose whole grains. Look for the word "whole" as the first word in the ingredient list.  · Choose fish and skinless chicken or turkey more often than red meat. Limit fish, poultry, and meat to 6 oz (170 g) each day.  · Limit sweets, desserts, sugars, and sugary drinks.  · Choose heart-healthy fats.  · Limit cheese to 1 oz (28 g) per day.  · Eat more home-cooked food and less restaurant, buffet, and fast food.  · Limit fried foods.  · Cook foods using methods other than frying.  · Limit canned vegetables. If you do use them, rinse them well to decrease the sodium.  · When eating at a restaurant, ask that your food be prepared with less salt, or no salt if possible.  WHAT FOODS CAN I EAT?  Seek help from a dietitian for individual calorie needs.  Grains  Whole grain or whole wheat bread. Brown rice. Whole grain or whole wheat pasta. Quinoa, bulgur, and whole grain cereals. Low-sodium cereals. Corn or whole wheat flour tortillas. Whole grain cornbread. Whole grain crackers. Low-sodium crackers.  Vegetables  Fresh or frozen vegetables  (raw, steamed, roasted, or grilled). Low-sodium or reduced-sodium tomato and vegetable juices. Low-sodium or reduced-sodium tomato sauce and paste. Low-sodium or reduced-sodium canned vegetables.   Fruits  All fresh, canned (in natural juice), or frozen fruits.  Meat and Other Protein Products  Ground beef (85% or leaner), grass-fed beef, or beef trimmed of fat. Skinless chicken or turkey. Ground chicken or turkey. Pork trimmed of fat. All fish and seafood. Eggs. Dried beans, peas, or lentils. Unsalted nuts and seeds. Unsalted canned beans.  Dairy  Low-fat dairy products, such as skim or 1% milk, 2% or reduced-fat cheeses, low-fat ricotta or cottage cheese, or plain low-fat yogurt. Low-sodium or reduced-sodium cheeses.  Fats and Oils  Tub margarines without trans fats. Light or reduced-fat mayonnaise and salad dressings (reduced sodium). Avocado. Safflower, olive, or canola oils. Natural peanut or almond butter.  Other  Unsalted popcorn and pretzels.  The items listed above may not be a complete list of recommended foods or beverages. Contact your dietitian for more options.  WHAT FOODS ARE NOT RECOMMENDED?  Grains  White bread. White pasta. White rice. Refined cornbread. Bagels and croissants. Crackers that contain trans fat.  Vegetables  Creamed or fried vegetables. Vegetables in a cheese sauce. Regular canned vegetables. Regular canned tomato sauce and paste. Regular tomato and vegetable juices.  Fruits  Dried fruits. Canned fruit in light or heavy syrup. Fruit juice.  Meat and Other Protein   Products  Fatty cuts of meat. Ribs, chicken wings, bacon, sausage, bologna, salami, chitterlings, fatback, hot dogs, bratwurst, and packaged luncheon meats. Salted nuts and seeds. Canned beans with salt.  Dairy  Whole or 2% milk, cream, half-and-half, and cream cheese. Whole-fat or sweetened yogurt. Full-fat cheeses or blue cheese. Nondairy creamers and whipped toppings. Processed cheese, cheese spreads, or cheese  curds.  Condiments  Onion and garlic salt, seasoned salt, table salt, and sea salt. Canned and packaged gravies. Worcestershire sauce. Tartar sauce. Barbecue sauce. Teriyaki sauce. Soy sauce, including reduced sodium. Steak sauce. Fish sauce. Oyster sauce. Cocktail sauce. Horseradish. Ketchup and mustard. Meat flavorings and tenderizers. Bouillon cubes. Hot sauce. Tabasco sauce. Marinades. Taco seasonings. Relishes.  Fats and Oils  Butter, stick margarine, lard, shortening, ghee, and bacon fat. Coconut, palm kernel, or palm oils. Regular salad dressings.  Other  Pickles and olives. Salted popcorn and pretzels.  The items listed above may not be a complete list of foods and beverages to avoid. Contact your dietitian for more information.  WHERE CAN I FIND MORE INFORMATION?  National Heart, Lung, and Blood Institute: www.nhlbi.nih.gov/health/health-topics/topics/dash/     This information is not intended to replace advice given to you by your health care provider. Make sure you discuss any questions you have with your health care provider.     Document Released: 01/16/2011 Document Revised: 02/17/2014 Document Reviewed: 12/01/2012  Elsevier Interactive Patient Education ©2016 Elsevier Inc.

## 2015-09-21 NOTE — Progress Notes (Signed)
Patient ID: Glenda Lyons, female    DOB: 1945-12-19  Age: 70 y.o. MRN: [id number]    Subjective:  Subjective  HPI [name] presents for f/u bp and cholesterol.  She has been more active and has lost some weight and feels better.    Review of Systems  Constitutional: Negative for activity change, appetite change, fatigue and unexpected weight change.  Respiratory: Negative for cough and shortness of breath.   Cardiovascular: Negative for chest pain and palpitations.  Psychiatric/Behavioral: Negative for behavioral problems and dysphoric mood. The patient is not nervous/anxious.     History Past Medical History:  Diagnosis Date  . Allergy    seasonal  . Depression   . Herniated disc   . Hyperlipemia   . Hypertension   . Neuromuscular disorder (HCC)   . Osteoarthritis   . Sciatica     She has a past surgical history that includes Tonsillectomy; Biopsy breast; LEEP (1994); Hysteroscopy with resectoscope (09/11/2010); Dilation and curettage of uterus (09/11/2010); Endometrial biopsy; and Cystectomy.   Her family history is not on file.She reports that she has never smoked. She has never used smokeless tobacco. She reports that she drinks alcohol. She reports that she does not use drugs.  Current Outpatient Prescriptions on File Prior to Visit  Medication Sig Dispense Refill  . aspirin 81 MG tablet Take 81 mg by mouth daily.    . Calcium Carb-Cholecalciferol (CALCIUM-VITAMIN D) 500-400 MG-UNIT TABS Take by mouth daily.    . Flaxseed, Linseed, (FLAX SEED OIL) 1000 MG CAPS Take by mouth daily.    . fluticasone (FLONASE) 50 MCG/ACT nasal spray instill 2 sprays into each nostril at bedtime 16 g 2  . vitamin A 40981 UNIT capsule Take 5,000 Units by mouth daily.    . vitamin C (ASCORBIC ACID) 250 MG tablet Take 250 mg by mouth daily.     No current facility-administered medications on file prior to visit.      Objective:  Objective  Physical Exam  Constitutional: She is  oriented to person, place, and time. She appears well-developed and well-nourished.  HENT:  Head: Normocephalic and atraumatic.  Eyes: Conjunctivae and EOM are normal.  Neck: Normal range of motion. Neck supple. No JVD present. Carotid bruit is not present. No thyromegaly present.  Cardiovascular: Normal rate, regular rhythm and normal heart sounds.   No murmur heard. Pulmonary/Chest: Effort normal and breath sounds normal. No respiratory distress. She has no wheezes. She has no rales. She exhibits no tenderness.  Musculoskeletal: She exhibits no edema.  Neurological: She is alert and oriented to person, place, and time.  Psychiatric: She has a normal mood and affect. Her behavior is normal.  Nursing note and vitals reviewed.  BP 140/84 (BP Location: Left Arm, Patient Position: Sitting, Cuff Size: Normal)   Pulse 62   Temp 97.7 F (36.5 C) (Oral)   Wt 158 lb 12.8 oz (72 kg)   SpO2 99%   BMI 26.43 kg/m  Wt Readings from Last 3 Encounters:  09/21/15 158 lb 12.8 oz (72 kg)  06/01/15 173 lb 3.2 oz (78.6 kg)  01/08/15 170 lb 6.4 oz (77.3 kg)     Lab Results  Component Value Date   WBC 7.2 06/01/2015   HGB 15.5 06/01/2015   HCT 46.1 (H) 06/01/2015   PLT 239 06/01/2015   GLUCOSE 97 06/01/2015   CHOL 252 (H) 06/01/2015   TRIG 102 06/01/2015   HDL 74 06/01/2015   LDLCALC 158 (H) 06/01/2015  ALT 16 06/01/2015   AST 18 06/01/2015   NA 142 06/01/2015   K 4.1 06/01/2015   CL 102 06/01/2015   CREATININE 0.84 06/01/2015   BUN 24 06/01/2015   CO2 25 06/01/2015   TSH 3.19 12/11/2011   HGBA1C 6.1 (H) 12/15/2005    Mm Diag Breast Tomo Uni Right  Result Date: 06/07/2015 CLINICAL DATA:  Patient returns today to evaluate a possible right breast distortion questioned on recent screening mammogram. Previous benign excisional biopsy of the right breast in 1994. EXAM: 2D DIGITAL DIAGNOSTIC UNILATERAL RIGHT MAMMOGRAM WITH CAD AND ADJUNCT TOMO COMPARISON:  Previous exams including recent  screening mammogram dated 05/28/2015. ACR Breast Density Category c: The breast tissue is heterogeneously dense, which may obscure small masses. FINDINGS: On today's additional views of the right breast with spot compression and 3D tomosynthesis, there is no persistent mammographic abnormality within the outer right breast indicating superimposition of normal fibroglandular tissues. There are no dominant masses, suspicious calcifications or secondary signs of malignancy. Mammographic images were processed with CAD. IMPRESSION: No evidence of malignancy within the right breast. Patient may return to routine annual bilateral screening mammogram schedule. RECOMMENDATION: Screening mammogram in one year.(Code:SM-B-01Y) I have discussed the findings and recommendations with the patient. Results were also provided in writing at the conclusion of the visit. If applicable, a reminder letter will be sent to the patient regarding the next appointment. BI-RADS CATEGORY  1: Negative. Electronically Signed   By: Bary Richard M.D.   On: 06/07/2015 17:03     Assessment & Plan:  Plan  I have discontinued Ms. Kistner's ezetimibe, niacin, trimethoprim-polymyxin b, potassium chloride SA, and ciprofloxacin. I am also having her maintain her fluticasone, aspirin, vitamin C, vitamin A, Calcium-Vitamin D, Flax Seed Oil, meloxicam, valsartan-hydrochlorothiazide, simvastatin, and sertraline.  Meds ordered this encounter  Medications  . meloxicam (MOBIC) 15 MG tablet    Sig: take 1/2 to 1 tablet by mouth once daily if needed    Dispense:  30 tablet    Refill:  0  . valsartan-hydrochlorothiazide (DIOVAN-HCT) 80-12.5 MG tablet    Sig: Take 1 tablet by mouth daily.    Dispense:  90 tablet    Refill:  1  . simvastatin (ZOCOR) 40 MG tablet    Sig: Take 1 tablet (40 mg total) by mouth at bedtime.    Dispense:  30 tablet    Refill:  2  . sertraline (ZOLOFT) 100 MG tablet    Sig: Take 1.5 tablets (150 mg total) by mouth daily.      Dispense:  45 tablet    Refill:  5    Problem List Items Addressed This Visit      Unprioritized   Essential hypertension - Primary    con't diovan hct rto 3 months or sooner prn  stable      Relevant Medications   valsartan-hydrochlorothiazide (DIOVAN-HCT) 80-12.5 MG tablet   simvastatin (ZOCOR) 40 MG tablet   Other Relevant Orders   Comprehensive metabolic panel   Hyperlipidemia    con't zocor Check lab       Relevant Medications   valsartan-hydrochlorothiazide (DIOVAN-HCT) 80-12.5 MG tablet   simvastatin (ZOCOR) 40 MG tablet   Other Relevant Orders   Comprehensive metabolic panel   Lipid panel    Other Visit Diagnoses    Low back pain with sciatica, sciatica laterality unspecified, unspecified back pain laterality       Relevant Medications   meloxicam (MOBIC) 15 MG tablet   Depression  Relevant Medications   sertraline (ZOLOFT) 100 MG tablet      Follow-up: Return in about 6 months (around 03/23/2016) for hypertension, hyperlipidemia, fasting, annual exam.  Donato SchultzYvonne R Lowne Chase, DO

## 2015-09-21 NOTE — Progress Notes (Signed)
Pre visit review using our clinic review tool, if applicable. No additional management support is needed unless otherwise documented below in the visit note. 

## 2015-10-01 ENCOUNTER — Other Ambulatory Visit: Payer: Self-pay | Admitting: Family Medicine

## 2015-10-01 DIAGNOSIS — E785 Hyperlipidemia, unspecified: Secondary | ICD-10-CM

## 2016-01-15 ENCOUNTER — Other Ambulatory Visit: Payer: Self-pay | Admitting: Family Medicine

## 2016-01-15 DIAGNOSIS — M5440 Lumbago with sciatica, unspecified side: Secondary | ICD-10-CM

## 2016-01-15 NOTE — Telephone Encounter (Signed)
eScribe request from [facility] for refill on Sertraline 100 mg Last filled - 09/21/15, #45x5 Rx Denied, Too Soon for request/SLS 12/05  eScribe request from [facility] for refill on Meloxicam 15 mg Last filled - 09/21/15, #30x0 Last AEX - 09/21/15 Next AEX - 6-Mths Refill sent per [facility] refill protocol/SLS 12/05

## 2016-01-16 ENCOUNTER — Other Ambulatory Visit: Payer: Self-pay | Admitting: Family Medicine

## 2016-01-16 DIAGNOSIS — F32A Depression, unspecified: Secondary | ICD-10-CM

## 2016-01-16 DIAGNOSIS — F329 Major depressive disorder, single episode, unspecified: Secondary | ICD-10-CM

## 2016-01-16 MED ORDER — SERTRALINE HCL 100 MG PO TABS: 150.0000 mg | ORAL_TABLET | Freq: Every day | ORAL | 2 refills | Status: DC

## 2016-01-16 NOTE — Telephone Encounter (Signed)
Patient notified that rx was sent to pharmacy.  Patient has an appointment on 03/25/16

## 2016-01-16 NOTE — Telephone Encounter (Signed)
Relation to ZO:XWRUpt:self Call back number:[phone] Pharmacy: [facility] [place] [address] [phone] (Phone) [phone] (Fax)     Reason for call:  Patient requesting a refill sertraline (ZOLOFT) 100 MG tablet

## 2016-01-17 ENCOUNTER — Other Ambulatory Visit: Payer: Self-pay | Admitting: Family Medicine

## 2016-01-22 ENCOUNTER — Telehealth: Payer: Self-pay

## 2016-01-22 NOTE — Telephone Encounter (Signed)
Called [facility] to verify they received Rx zoloft. Rep confirmed they received Rx on 01/18/16 and picked up by pt. Glenda Lyons

## 2016-01-22 NOTE — Telephone Encounter (Signed)
-----   Message from Audelia Actonenise L Smith sent at 01/18/2016  1:17 PM EST ----- Regarding: Resend Rx [facility] [place] Rx for sertraline (ZOLOFT) 100 MG tablet [[id number]) sent again. States they did not get it.

## 2016-02-01 ENCOUNTER — Other Ambulatory Visit: Payer: Self-pay | Admitting: Family Medicine

## 2016-03-25 ENCOUNTER — Encounter: Payer: Self-pay | Admitting: Family Medicine

## 2016-03-25 ENCOUNTER — Ambulatory Visit (INDEPENDENT_AMBULATORY_CARE_PROVIDER_SITE_OTHER): Payer: Medicare Other | Admitting: Family Medicine

## 2016-03-25 VITALS — BP 122/70 | HR 70 | Temp 98.1°F | Resp 16 | Ht 65.0 in | Wt 152.2 lb

## 2016-03-25 DIAGNOSIS — I10 Essential (primary) hypertension: Secondary | ICD-10-CM | POA: Diagnosis not present

## 2016-03-25 DIAGNOSIS — M5440 Lumbago with sciatica, unspecified side: Secondary | ICD-10-CM

## 2016-03-25 DIAGNOSIS — E2839 Other primary ovarian failure: Secondary | ICD-10-CM | POA: Diagnosis not present

## 2016-03-25 DIAGNOSIS — E785 Hyperlipidemia, unspecified: Secondary | ICD-10-CM | POA: Diagnosis not present

## 2016-03-25 LAB — COMPREHENSIVE METABOLIC PANEL
ALBUMIN: 4.4 g/dL (ref 3.5–5.2)
ALT: 19 U/L (ref 0–35)
AST: 20 U/L (ref 0–37)
Alkaline Phosphatase: 58 U/L (ref 39–117)
BUN: 20 mg/dL (ref 6–23)
CHLORIDE: 103 meq/L (ref 96–112)
CO2: 30 mEq/L (ref 19–32)
CREATININE: 0.65 mg/dL (ref 0.40–1.20)
Calcium: 9.7 mg/dL (ref 8.4–10.5)
GFR: 95.45 mL/min (ref >60.00)
Glucose, Bld: 103 mg/dL — ABNORMAL HIGH (ref 70–99)
Potassium: 3.8 mEq/L (ref 3.5–5.1)
SODIUM: 139 meq/L (ref 135–145)
TOTAL PROTEIN: 7.2 g/dL (ref 6.0–8.3)
Total Bilirubin: 0.6 mg/dL (ref 0.2–1.2)

## 2016-03-25 LAB — LIPID PANEL
CHOLESTEROL: 183 mg/dL (ref 0–200)
HDL: 68.4 mg/dL (ref >39.00)
LDL CALC: 99 mg/dL (ref 0–99)
NonHDL: 114.84
Total CHOL/HDL Ratio: 3
Triglycerides: 79 mg/dL (ref 0.0–149.0)
VLDL: 15.8 mg/dL (ref 0.0–40.0)

## 2016-03-25 MED ORDER — MELOXICAM 15 MG PO TABS: ORAL_TABLET | ORAL | 5 refills | Status: DC

## 2016-03-25 NOTE — Patient Instructions (Signed)

## 2016-03-25 NOTE — Progress Notes (Signed)
Subjective:    Patient ID: Glenda Lyons, female    DOB: [birth year], 71 y.o.   MRN: [id number]   I acted as a Neurosurgeon for Dr. Delman Kitten, LPN   Chief Complaint  Patient presents with  . Hypertension  . Hyperlipidemia    Hypertension  This is a chronic problem. The current episode started more than 1 year ago. Pertinent negatives include no blurred vision, chest pain, headaches or palpitations.  Hyperlipidemia  This is a chronic problem. The current episode started more than 1 year ago. Pertinent negatives include no chest pain.    Patient is in today for [facility]  Past Medical History:  Diagnosis Date  . Allergy    seasonal  . Depression   . Herniated disc   . Hyperlipemia   . Hypertension   . Neuromuscular disorder (HCC)   . Osteoarthritis   . Sciatica     Past Surgical History:  Procedure Laterality Date  . BIOPSY BREAST    . CYSTECTOMY     leg  . DILATION AND CURETTAGE OF UTERUS  09/11/2010   Procedure: DILATATION AND CURETTAGE (D&C);  Surgeon: Michael Litter, MD;  Location: [facility];  Service: Gynecology;  Laterality: N/A;  . ENDOMETRIAL BIOPSY    . HYSTEROSCOPY WITH RESECTOSCOPE  09/11/2010   Procedure: HYSTEROSCOPY WITH RESECTOSCOPE;  Surgeon: Michael Litter, MD;  Location: [facility];  Service: Gynecology;  Laterality: N/A;  . LEEP  1994  . TONSILLECTOMY      Family History  Problem Relation Age of Onset  . Breast cancer    . Heart attack    . Lymphoma    . Colon cancer Neg Hx     Social History   Social History  . Marital status: Married    Spouse name: N/A  . Number of children: N/A  . Years of education: N/A   Occupational History  . Not on file.   Social History Main Topics  . Smoking status: Never Smoker  . Smokeless tobacco: Never Used  . Alcohol use Yes     Comment: occasionally  . Drug use: No  . Sexual activity: Yes    Partners: Male   Other Topics Concern  . Not on file   Social History Narrative   Exercise-- just walking up  and down the stairs at home    Outpatient Medications Prior to Visit  Medication Sig Dispense Refill  . aspirin 81 MG tablet Take 81 mg by mouth daily.    . Calcium Carb-Cholecalciferol (CALCIUM-VITAMIN D) 500-400 MG-UNIT TABS Take by mouth daily.    . Flaxseed, Linseed, (FLAX SEED OIL) 1000 MG CAPS Take by mouth daily.    . fluticasone (FLONASE) 50 MCG/ACT nasal spray instill 2 sprays into each nostril at bedtime 16 g 2  . sertraline (ZOLOFT) 100 MG tablet TAKE 1 AND 1/2 TABLETS BY MOUTH ONCE DAILY 45 tablet 2  . valsartan-hydrochlorothiazide (DIOVAN-HCT) 80-12.5 MG tablet take 1 tablet by mouth daily 90 tablet 1  . vitamin A 78295 UNIT capsule Take 5,000 Units by mouth daily.    . vitamin C (ASCORBIC ACID) 250 MG tablet Take 250 mg by mouth daily.    . meloxicam (MOBIC) 15 MG tablet take 1/2 to 1 tablet by mouth once daily if needed 30 tablet 0  . simvastatin (ZOCOR) 40 MG tablet take 1 tablet by mouth at bedtime 30 tablet 5  . valsartan-hydrochlorothiazide (DIOVAN-HCT) 80-12.5 MG tablet Take 1 tablet by mouth daily. 90  tablet 1   No facility-administered medications prior to visit.     Allergies  Allergen Reactions  . Almond Oil Other (See Comments)    Throat raw and scratchy  . Other Other (See Comments)    SUTURE MATERIAL- REDNESS, IRRITATION, TOOK A LONG TIME TO HEAL    Review of Systems  Constitutional: Negative for fever.  HENT: Negative for congestion.   Eyes: Negative for blurred vision.  Respiratory: Negative for cough.   Cardiovascular: Negative for chest pain and palpitations.  Gastrointestinal: Negative for vomiting.  Musculoskeletal: Negative for back pain.  Skin: Negative for rash.  Neurological: Negative for loss of consciousness and headaches.       Objective:    Physical Exam  Constitutional: She is oriented to person, place, and time. She appears well-developed and well-nourished. No distress.  HENT:  Head: Normocephalic and atraumatic.  Eyes:  Conjunctivae are normal. Pupils are equal, round, and reactive to light.  Neck: Normal range of motion. No thyromegaly present.  Cardiovascular: Normal rate and regular rhythm.   Pulmonary/Chest: Effort normal and breath sounds normal. She has no wheezes.  Abdominal: Soft. Bowel sounds are normal. There is no tenderness.  Musculoskeletal: Normal range of motion. She exhibits no edema or deformity.  Neurological: She is alert and oriented to person, place, and time.  Skin: Skin is warm and dry. She is not diaphoretic.  Psychiatric: She has a normal mood and affect.    BP 122/70 (BP Location: Left Arm, Patient Position: Sitting, Cuff Size: Normal)   Pulse 70   Temp 98.1 F (36.7 C) (Oral)   Resp 16   Ht 5\' 5"  (1.651 m)   Wt 152 lb 3.2 oz (69 kg)   SpO2 99%   BMI 25.33 kg/m  Wt Readings from Last 3 Encounters:  03/25/16 152 lb 3.2 oz (69 kg)  09/21/15 158 lb 12.8 oz (72 kg)  06/01/15 173 lb 3.2 oz (78.6 kg)     Lab Results  Component Value Date   WBC 7.2 06/01/2015   HGB 15.5 06/01/2015   HCT 46.1 (H) 06/01/2015   PLT 239 06/01/2015   GLUCOSE 103 (H) 03/25/2016   CHOL 183 03/25/2016   TRIG 79.0 03/25/2016   HDL 68.40 03/25/2016   LDLCALC 99 03/25/2016   ALT 19 03/25/2016   AST 20 03/25/2016   NA 139 03/25/2016   K 3.8 03/25/2016   CL 103 03/25/2016   CREATININE 0.65 03/25/2016   BUN 20 03/25/2016   CO2 30 03/25/2016   TSH 3.19 12/11/2011   HGBA1C 6.1 (H) 12/15/2005    Lab Results  Component Value Date   TSH 3.19 12/11/2011   Lab Results  Component Value Date   WBC 7.2 06/01/2015   HGB 15.5 06/01/2015   HCT 46.1 (H) 06/01/2015   MCV 92.0 06/01/2015   PLT 239 06/01/2015   Lab Results  Component Value Date   NA 139 03/25/2016   K 3.8 03/25/2016   CO2 30 03/25/2016   GLUCOSE 103 (H) 03/25/2016   BUN 20 03/25/2016   CREATININE 0.65 03/25/2016   BILITOT 0.6 03/25/2016   ALKPHOS 58 03/25/2016   AST 20 03/25/2016   ALT 19 03/25/2016   PROT 7.2  03/25/2016   ALBUMIN 4.4 03/25/2016   CALCIUM 9.7 03/25/2016   GFR 95.45 03/25/2016   Lab Results  Component Value Date   CHOL 183 03/25/2016   Lab Results  Component Value Date   HDL 68.40 03/25/2016   Lab Results  Component Value Date   LDLCALC 99 03/25/2016   Lab Results  Component Value Date   TRIG 79.0 03/25/2016   Lab Results  Component Value Date   CHOLHDL 3 03/25/2016   Lab Results  Component Value Date   HGBA1C 6.1 (H) 12/15/2005       Assessment & Plan:   Problem List Items Addressed This Visit      Unprioritized   Essential hypertension - Primary   Relevant Medications   simvastatin (ZOCOR) 40 MG tablet   valsartan-hydrochlorothiazide (DIOVAN-HCT) 80-12.5 MG tablet   Other Relevant Orders   Comprehensive metabolic panel (Completed)   Lipid panel (Completed)   Hyperlipidemia   Relevant Medications   simvastatin (ZOCOR) 40 MG tablet   valsartan-hydrochlorothiazide (DIOVAN-HCT) 80-12.5 MG tablet   Other Relevant Orders   Lipid panel (Completed)    Other Visit Diagnoses    Acute low back pain with sciatica, sciatica laterality unspecified, unspecified back pain laterality       Relevant Medications   meloxicam (MOBIC) 15 MG tablet   Estrogen deficiency       Relevant Orders   DG Bone Density      I have changed Ms. January's simvastatin. I am also having her maintain her fluticasone, aspirin, vitamin C, vitamin A, Calcium-Vitamin D, Flax Seed Oil, sertraline, valsartan-hydrochlorothiazide, meloxicam, and valsartan-hydrochlorothiazide.  Meds ordered this encounter  Medications  . meloxicam (MOBIC) 15 MG tablet    Sig: take 1/2 to 1 tablet by mouth once daily if needed    Dispense:  30 tablet    Refill:  5  . simvastatin (ZOCOR) 40 MG tablet    Sig: Take 1 tablet (40 mg total) by mouth at bedtime.    Dispense:  30 tablet    Refill:  5  . valsartan-hydrochlorothiazide (DIOVAN-HCT) 80-12.5 MG tablet    Sig: Take 1 tablet by mouth daily.     Dispense:  90 tablet    Refill:  1    CMA served as Neurosurgeonscribe during this visit. History, Physical and Plan performed by medical provider. Documentation and orders reviewed and attested to.  Donato SchultzYvonne R Lowne Chase, DO Patient ID: Glenda ButtMartha M Ing, female   DOB: [birth year], 71 y.o.   MRN: [id number]

## 2016-03-25 NOTE — Progress Notes (Signed)
Pre visit review using our clinic review tool, if applicable. No additional management support is needed unless otherwise documented below in the visit note. 

## 2016-03-29 MED ORDER — SIMVASTATIN 40 MG PO TABS
40.0000 mg | ORAL_TABLET | Freq: Every day | ORAL | 5 refills | Status: DC
Start: 2016-03-29 — End: 2016-11-26

## 2016-03-29 MED ORDER — VALSARTAN-HYDROCHLOROTHIAZIDE 80-12.5 MG PO TABS
1.0000 | ORAL_TABLET | Freq: Every day | ORAL | 1 refills | Status: DC
Start: 2016-03-29 — End: 2016-08-28

## 2016-05-01 ENCOUNTER — Other Ambulatory Visit: Payer: Self-pay | Admitting: Family Medicine

## 2016-06-05 ENCOUNTER — Telehealth: Payer: Self-pay | Admitting: Family Medicine

## 2016-06-05 NOTE — Telephone Encounter (Signed)
Left pt message asking to call Allison back directly at [phone] to schedule AWV. Thanks! °

## 2016-07-09 NOTE — Telephone Encounter (Signed)
If pt agrees, extended appt on 09/23/16. Left message with pt spouse to call back and confirm.

## 2016-08-05 ENCOUNTER — Other Ambulatory Visit: Payer: Self-pay | Admitting: Family Medicine

## 2016-08-05 ENCOUNTER — Ambulatory Visit
Admission: RE | Admit: 2016-08-05 | Discharge: 2016-08-05 | Disposition: A | Discharge status: Home / Self Care | Payer: Medicare Other | Source: Ambulatory Visit | Attending: Family Medicine | Admitting: Family Medicine

## 2016-08-05 DIAGNOSIS — Z789 Other specified health status: Secondary | ICD-10-CM

## 2016-08-06 ENCOUNTER — Telehealth: Payer: Self-pay | Admitting: Family Medicine

## 2016-08-06 DIAGNOSIS — E2839 Other primary ovarian failure: Secondary | ICD-10-CM

## 2016-08-06 NOTE — Telephone Encounter (Signed)
Order placed and patient notified 

## 2016-08-06 NOTE — Telephone Encounter (Signed)
patient called wanting to get another order for her dexa scan. She tried to schedule at [facility] but order had expired

## 2016-08-08 ENCOUNTER — Other Ambulatory Visit: Payer: Self-pay | Admitting: Family Medicine

## 2016-08-11 ENCOUNTER — Ambulatory Visit (INDEPENDENT_AMBULATORY_CARE_PROVIDER_SITE_OTHER)
Admission: RE | Admit: 2016-08-11 | Discharge: 2016-08-11 | Disposition: A | Discharge status: Home / Self Care | Payer: Medicare Other | Source: Ambulatory Visit | Attending: Family Medicine | Admitting: Family Medicine

## 2016-08-11 DIAGNOSIS — E2839 Other primary ovarian failure: Secondary | ICD-10-CM | POA: Diagnosis not present

## 2016-08-27 ENCOUNTER — Other Ambulatory Visit: Payer: Self-pay | Admitting: Family Medicine

## 2016-08-28 MED ORDER — LOSARTAN POTASSIUM-HCTZ 50-12.5 MG PO TABS: 1.0000 | ORAL_TABLET | Freq: Every day | ORAL | 3 refills | Status: DC

## 2016-08-28 NOTE — Telephone Encounter (Signed)
Please what new medications you would like change her to.     PC

## 2016-08-28 NOTE — Telephone Encounter (Signed)
Losartan 50/12.5   #90  1 po qd , 3 refills Ov 2-3 weeks for bp check

## 2016-08-28 NOTE — Telephone Encounter (Signed)
Called patient and let her know about the medication change no answer.  Left message for patient to call the ofc back.    PC

## 2016-08-29 NOTE — Telephone Encounter (Signed)
Spoke with patient she stated she already has appointment with PCP on  09/23/16. [place] and states she does not really want to come for a RN bp check then see the PCP a week later. I advised her to start logging her BP readings after about a week or two on the losartan, she was advised if her BP numbers where off she need to call into the office and let her PCP know. She agreed and voiced her understanding.

## 2016-09-23 ENCOUNTER — Encounter: Payer: Self-pay | Admitting: Family Medicine

## 2016-09-23 ENCOUNTER — Other Ambulatory Visit (HOSPITAL_COMMUNITY)
Admission: RE | Admit: 2016-09-23 | Discharge: 2016-09-23 | Disposition: A | Discharge status: Home / Self Care | Payer: Medicare Other | Source: Ambulatory Visit | Attending: Family Medicine | Admitting: Family Medicine

## 2016-09-23 ENCOUNTER — Ambulatory Visit (INDEPENDENT_AMBULATORY_CARE_PROVIDER_SITE_OTHER): Payer: Medicare Other | Admitting: Family Medicine

## 2016-09-23 VITALS — BP 120/78 | HR 70 | Temp 98.5°F | Ht 65.5 in | Wt 153.2 lb

## 2016-09-23 DIAGNOSIS — Z01419 Encounter for gynecological examination (general) (routine) without abnormal findings: Secondary | ICD-10-CM

## 2016-09-23 DIAGNOSIS — I10 Essential (primary) hypertension: Secondary | ICD-10-CM | POA: Insufficient documentation

## 2016-09-23 DIAGNOSIS — E785 Hyperlipidemia, unspecified: Secondary | ICD-10-CM | POA: Diagnosis not present

## 2016-09-23 DIAGNOSIS — Z0000 Encounter for general adult medical examination without abnormal findings: Secondary | ICD-10-CM | POA: Diagnosis not present

## 2016-09-23 LAB — POC URINALSYSI DIPSTICK (AUTOMATED)
Bilirubin, UA: NEGATIVE
Glucose, UA: NEGATIVE
Ketones, UA: NEGATIVE
Leukocytes, UA: NEGATIVE
Nitrite, UA: NEGATIVE
PH UA: 6 (ref 5.0–8.0)
PROTEIN UA: NEGATIVE
RBC UA: NEGATIVE
SPEC GRAV UA: 1.02 (ref 1.010–1.025)
Urobilinogen, UA: 0.2 E.U./dL

## 2016-09-23 NOTE — Progress Notes (Signed)
Pre visit review using our clinic review tool, if applicable. No additional management support is needed unless otherwise documented below in the visit note. 

## 2016-09-23 NOTE — Progress Notes (Signed)
Subjective:   Glenda Lyons is a 71 y.o. female who presents for Medicare Annual (Subsequent) preventive examination.  Review of Systems:  No ROS.  Medicare Wellness Visit. Additional risk factors are reflected in the social history.  Cardiac Risk Factors include: advanced age (>56men, >59 women);hypertension;dyslipidemia  Sleep patterns: no sleep issues.   Home Safety/Smoke Alarms: Feels safe in home. Smoke alarms in place.  Living environment; residence and Firearm Safety: Lives w/ husband. 2-story house, no firearms. Seat Belt Safety/Bike Helmet: Wears seat belt.   Female:   Pap- Done today by PCP.      Mammo- last 08/05/2016. BI-RADS CATEGORY  1: Negative       Dexa scan- last 08/11/2016. Normal.     CCS- last 06/15/2013. Normal, 10 year recall.     Objective:     Vitals: BP 120/78 (BP Location: Right Arm, Patient Position: Sitting, Cuff Size: Normal)   Pulse 70   Temp 98.5 F (36.9 C) (Oral)   Ht 5' 5.5" (1.664 m)   Wt 153 lb 4 oz (69.5 kg)   SpO2 99%   BMI 25.11 kg/m   Body mass index is 25.11 kg/m.   Tobacco History  Smoking Status  . Never Smoker  Smokeless Tobacco  . Never Used     Counseling given: Not Answered   Past Medical History:  Diagnosis Date  . Allergy    seasonal  . Depression   . Herniated disc   . Hyperlipemia   . Hypertension   . Neuromuscular disorder (HCC)   . Osteoarthritis   . Sciatica    Past Surgical History:  Procedure Laterality Date  . BIOPSY BREAST    . BREAST BIOPSY Right   . CYSTECTOMY     leg  . DILATION AND CURETTAGE OF UTERUS  09/11/2010   Procedure: DILATATION AND CURETTAGE (D&C);  Surgeon: Michael Litter, MD;  Location: [facility];  Service: Gynecology;  Laterality: N/A;  . ENDOMETRIAL BIOPSY    . HYSTEROSCOPY WITH RESECTOSCOPE  09/11/2010   Procedure: HYSTEROSCOPY WITH RESECTOSCOPE;  Surgeon: Michael Litter, MD;  Location: [facility];  Service: Gynecology;  Laterality: N/A;  . LEEP  1994  . TONSILLECTOMY      Family History  Problem Relation Age of Onset  . Heart attack Unknown   . Lymphoma Unknown   . Breast cancer Mother   . Colon cancer Neg Hx    History  Sexual Activity  . Sexual activity: Yes  . Partners: Male    Outpatient Encounter Prescriptions as of 09/23/2016  Medication Sig  . aspirin 81 MG tablet Take 81 mg by mouth daily.  . Calcium Carb-Cholecalciferol (CALCIUM-VITAMIN D) 500-400 MG-UNIT TABS Take by mouth daily.  . Flaxseed, Linseed, (FLAX SEED OIL) 1000 MG CAPS Take by mouth daily.  [name] losartan-hydrochlorothiazide (HYZAAR) 50-12.5 MG tablet Take 1 tablet by mouth daily.  . sertraline (ZOLOFT) 100 MG tablet TAKE 1 AND 1/2 TABLETS BY MOUTH ONCE DAILY  . simvastatin (ZOCOR) 40 MG tablet Take 1 tablet (40 mg total) by mouth at bedtime.  . vitamin A 16109 UNIT capsule Take 5,000 Units by mouth daily.  . vitamin C (ASCORBIC ACID) 250 MG tablet Take 250 mg by mouth daily.  . fluticasone (FLONASE) 50 MCG/ACT nasal spray instill 2 sprays into each nostril at bedtime (Patient not taking: Reported on 09/23/2016)  . meloxicam (MOBIC) 15 MG tablet take 1/2 to 1 tablet by mouth once daily if needed (Patient not taking: Reported on  09/23/2016)   No facility-administered encounter medications on file as of 09/23/2016.     Activities of Daily Living In your present state of health, do you have any difficulty performing the following activities: 09/23/2016  Hearing? N  Vision? N  Difficulty concentrating or making decisions? N  Walking or climbing stairs? N  Dressing or bathing? N  Doing errands, shopping? N  Preparing Food and eating ? N  Using the Toilet? N  In the past six months, have you accidently leaked urine? N  Do you have problems with loss of bowel control? N  Managing your Medications? N  Managing your Finances? N  Housekeeping or managing your Housekeeping? N  Some recent data might be hidden    Patient Care Team: Zola ButtonLowne Chase, Grayling CongressYvonne R, DO as PCP - General Gentry RochMartin,  Timothy, MD as Referring Physician (Ophthalmology) Lunette StandsVoytek, Anna, MD as Consulting Physician (Orthopedic Surgery) Basilio CairoMcMichael, Amy J, MD as Consulting Physician (Dermatology) Pati GalloKramer, James, MD as Consulting Physician (Sports Medicine) Antionette CharBull, Sharl Mahomas Anthony, OD (Optometry)    Assessment:    Physical assessment deferred to PCP.  Exercise Activities and Dietary recommendations Current Exercise Habits: The patient does not participate in regular exercise at present, Exercise limited by: Other - see comments (Time is limited due to husband's poor health.)  Goals    . Increase physical activity (pt-stated)          Starting 09/23/2016, I will renew my [employer] membership and try to swim at least twice weekly for at least 10-15 minutes each time.      Fall Risk Fall Risk  09/23/2016 09/21/2015 06/01/2015 06/01/2015 09/22/2013  Falls in the past year? No No No No No   Depression Screen PHQ 2/9 Scores 09/23/2016 03/25/2016 09/21/2015 06/01/2015  PHQ - 2 Score 0 0 0 0     Cognitive Function MMSE - Mini Mental State Exam 09/23/2016  Orientation to time 5  Orientation to Place 5  Registration 3  Attention/ Calculation 5  Recall 2  Language- name 2 objects 2  Language- repeat 1  Language- follow 3 step command 3  Language- read & follow direction 1  Write a sentence 1  Copy design 1  Total score 29        Immunization History  Administered Date(s) Administered  . Influenza Split 12/11/2011, 10/01/2015  . Influenza Whole 09/19/2008  . Influenza, High Dose Seasonal PF 11/09/2012, 01/08/2014  . Influenza-Unspecified 11/11/2014  . Pneumococcal Conjugate-13 09/22/2013  . Pneumococcal Polysaccharide-23 03/13/2010  . Td 03/13/2010   Screening Tests Health Maintenance  Topic Date Due  . INFLUENZA VACCINE  09/10/2016  . MAMMOGRAM  08/06/2018  . TETANUS/TDAP  03/13/2020  . COLONOSCOPY  06/16/2023  . DEXA SCAN  Completed  . Hepatitis C Screening  Completed  . PNA vac Low Risk Adult   Completed      Plan:    Follow-up w/ PCP as directed.   I have personally reviewed and noted the following in the patient's chart:   . Medical and social history . Use of alcohol, tobacco or illicit drugs  . Current medications and supplements . Functional ability and status . Nutritional status . Physical activity . Advanced directives . List of other physicians . Vitals . Screenings to include cognitive, depression, and falls . Referrals and appointments  In addition, I have reviewed and discussed with patient certain preventive protocols, quality metrics, and best practice recommendations. A written personalized care plan for preventive services as well as general preventive  health recommendations were provided to patient.     Starla Link, RN  09/23/2016

## 2016-09-23 NOTE — Patient Instructions (Addendum)
Glenda Lyons , Thank you for taking time to come for your Medicare Wellness Visit. I appreciate your ongoing commitment to your health goals. Please review the following plan we discussed and let me know if I can assist you in the future.   Create and bring a copy of your living will and health care power of attorney to your next office visit.  These are the goals we discussed: Goals    . Increase physical activity (pt-stated)          Starting 09/23/2016, I will renew my [place] membership and try to swim at least twice weekly for at least 10-15 minutes each time.       This is a list of the screening recommended for you and due dates:  Health Maintenance  Topic Date Due  . Flu Shot  09/10/2016  . Mammogram  08/06/2018  . Tetanus Vaccine  03/13/2020  . Colon Cancer Screening  06/16/2023  . DEXA scan (bone density measurement)  Completed  .  Hepatitis C: One time screening is recommended by Center for Disease Control  (CDC) for  adults born from [birth year] through 1965.   Completed  . Pneumonia vaccines  Completed    Preventive Care 37 Years and Older, Female Preventive care refers to lifestyle choices and visits with your health care provider that can promote health and wellness. What does preventive care include?  A yearly physical exam. This is also called an annual well check.  Dental exams once or twice a year.  Routine eye exams. Ask your health care provider how often you should have your eyes checked.  Personal lifestyle choices, including: ? Daily care of your teeth and gums. ? Regular physical activity. ? Eating a healthy diet. ? Avoiding tobacco and drug use. ? Limiting alcohol use. ? Practicing safe sex. ? Taking low-dose aspirin every day. ? Taking vitamin and mineral supplements as recommended by your health care provider. What happens during an annual well check? The services and screenings done by your health care provider during your annual well check  will depend on your age, overall health, lifestyle risk factors, and family history of disease. Counseling Your health care provider may ask you questions about your:  Alcohol use.  Tobacco use.  Drug use.  Emotional well-being.  Home and relationship well-being.  Sexual activity.  Eating habits.  History of falls.  Memory and ability to understand (cognition).  Work and work Statistician.  Reproductive health.  Screening You may have the following tests or measurements:  Height, weight, and BMI.  Blood pressure.  Lipid and cholesterol levels. These may be checked every 5 years, or more frequently if you are over 82 years old.  Skin check.  Lung cancer screening. You may have this screening every year starting at age 28 if you have a 30-pack-year history of smoking and currently smoke or have quit within the past 15 years.  Fecal occult blood test (FOBT) of the stool. You may have this test every year starting at age 53.  Flexible sigmoidoscopy or colonoscopy. You may have a sigmoidoscopy every 5 years or a colonoscopy every 10 years starting at age 24.  Hepatitis C blood test.  Hepatitis B blood test.  Sexually transmitted disease (STD) testing.  Diabetes screening. This is done by checking your blood sugar (glucose) after you have not eaten for a while (fasting). You may have this done every 1-3 years.  Bone density scan. This is done to screen for  osteoporosis. You may have this done starting at age 39.  Mammogram. This may be done every 1-2 years. Talk to your health care provider about how often you should have regular mammograms.  Talk with your health care provider about your test results, treatment options, and if necessary, the need for more tests. Vaccines Your health care provider may recommend certain vaccines, such as:  Influenza vaccine. This is recommended every year.  Tetanus, diphtheria, and acellular pertussis (Tdap, Td) vaccine. You may  need a Td booster every 10 years.  Varicella vaccine. You may need this if you have not been vaccinated.  Zoster vaccine. You may need this after age 15.  Measles, mumps, and rubella (MMR) vaccine. You may need at least one dose of MMR if you were born in 1957 or later. You may also need a second dose.  Pneumococcal 13-valent conjugate (PCV13) vaccine. One dose is recommended after age 90.  Pneumococcal polysaccharide (PPSV23) vaccine. One dose is recommended after age 12.  Meningococcal vaccine. You may need this if you have certain conditions.  Hepatitis A vaccine. You may need this if you have certain conditions or if you travel or work in places where you may be exposed to hepatitis A.  Hepatitis B vaccine. You may need this if you have certain conditions or if you travel or work in places where you may be exposed to hepatitis B.  Haemophilus influenzae type b (Hib) vaccine. You may need this if you have certain conditions.  Talk to your health care provider about which screenings and vaccines you need and how often you need them. This information is not intended to replace advice given to you by your health care provider. Make sure you discuss any questions you have with your health care provider. Document Released: 02/23/2015 Document Revised: 10/17/2015 Document Reviewed: 11/28/2014 Elsevier Interactive Patient Education  2017 [employer].

## 2016-09-23 NOTE — Progress Notes (Signed)
Subjective:     Glenda Lyons is a 71 y.o. female and is here for a comprehensive physical exam. The patient reports no problems.  Social History   Social History  . Marital status: Married    Spouse name: N/A  . Number of children: N/A  . Years of education: N/A   Occupational History  . Not on file.   Social History Main Topics  . Smoking status: Never Smoker  . Smokeless tobacco: Never Used  . Alcohol use Yes     Comment: occasionally  . Drug use: No  . Sexual activity: Yes    Partners: Male   Other Topics Concern  . Not on file   Social History Narrative   Exercise-- just walking up and down the stairs at home   Health Maintenance  Topic Date Due  . INFLUENZA VACCINE  09/10/2016  . MAMMOGRAM  08/06/2018  . TETANUS/TDAP  03/13/2020  . COLONOSCOPY  06/16/2023  . DEXA SCAN  Completed  . Hepatitis C Screening  Completed  . PNA vac Low Risk Adult  Completed    The following portions of the patient's history were reviewed and updated as appropriate:  She  has a past medical history of Allergy; Depression; Herniated disc; Hyperlipemia; Hypertension; Neuromuscular disorder (HCC); Osteoarthritis; and Sciatica. She  does not have any pertinent problems on file. She  has a past surgical history that includes Tonsillectomy; Biopsy breast; LEEP (1994); Hysteroscopy with resectoscope (09/11/2010); Dilation and curettage of uterus (09/11/2010); Endometrial biopsy; Cystectomy; and Breast biopsy (Right). Her family history includes Breast cancer in her mother; Heart attack in her unknown relative; Lymphoma in her unknown relative. She  reports that she has never smoked. She has never used smokeless tobacco. She reports that she drinks alcohol. She reports that she does not use drugs. She has a current medication list which includes the following prescription(s): aspirin, calcium-vitamin d, flax seed oil, losartan-hydrochlorothiazide, sertraline, simvastatin, vitamin a, vitamin c,  fluticasone, and meloxicam. Current Outpatient Prescriptions on File Prior to Visit  Medication Sig Dispense Refill  . aspirin 81 MG tablet Take 81 mg by mouth daily.    . Calcium Carb-Cholecalciferol (CALCIUM-VITAMIN D) 500-400 MG-UNIT TABS Take by mouth daily.    . Flaxseed, Linseed, (FLAX SEED OIL) 1000 MG CAPS Take by mouth daily.    [name] losartan-hydrochlorothiazide (HYZAAR) 50-12.5 MG tablet Take 1 tablet by mouth daily. 90 tablet 3  . sertraline (ZOLOFT) 100 MG tablet TAKE 1 AND 1/2 TABLETS BY MOUTH ONCE DAILY 45 tablet 2  . simvastatin (ZOCOR) 40 MG tablet Take 1 tablet (40 mg total) by mouth at bedtime. 30 tablet 5  . vitamin A 16109 UNIT capsule Take 5,000 Units by mouth daily.    . vitamin C (ASCORBIC ACID) 250 MG tablet Take 250 mg by mouth daily.    . fluticasone (FLONASE) 50 MCG/ACT nasal spray instill 2 sprays into each nostril at bedtime (Patient not taking: Reported on 09/23/2016) 16 g 2  . meloxicam (MOBIC) 15 MG tablet take 1/2 to 1 tablet by mouth once daily if needed (Patient not taking: Reported on 09/23/2016) 30 tablet 5   No current facility-administered medications on file prior to visit.    She is allergic to almond oil and other..  Review of Systems Review of Systems  Constitutional: Negative for activity change, appetite change and fatigue.  HENT: Negative for hearing loss, congestion, tinnitus and ear discharge.  dentist q53m Eyes: Negative for visual disturbance (see optho q1y --  vision corrected to 20/20 with glasses).  Respiratory: Negative for cough, chest tightness and shortness of breath.   Cardiovascular: Negative for chest pain, palpitations and leg swelling.  Gastrointestinal: Negative for abdominal pain, diarrhea, constipation and abdominal distention.  Genitourinary: Negative for urgency, frequency, decreased urine volume and difficulty urinating.  Musculoskeletal: Negative for back pain, arthralgias and gait problem.  Skin: Negative for color change,  pallor and rash.  Neurological: Negative for dizziness, light-headedness, numbness and headaches.  Hematological: Negative for adenopathy. Does not bruise/bleed easily.  Psychiatric/Behavioral: Negative for suicidal ideas, confusion, sleep disturbance, self-injury, dysphoric mood, decreased concentration and agitation.       Objective:    BP 120/78 (BP Location: Right Arm, Patient Position: Sitting, Cuff Size: Normal)   Pulse 70   Temp 98.5 F (36.9 C) (Oral)   Ht 5' 5.5" (1.664 m)   Wt 153 lb 4 oz (69.5 kg)   SpO2 99%   BMI 25.11 kg/m  General appearance: alert, cooperative, appears stated age and no distress Head: Normocephalic, without obvious abnormality, atraumatic Eyes: conjunctivae/corneas clear. PERRL, EOM's intact. Fundi benign. Ears: normal TM's and external ear canals both ears Nose: Nares normal. Septum midline. Mucosa normal. No drainage or sinus tenderness. Throat: lips, mucosa, and tongue normal; teeth and gums normal Neck: no adenopathy, no carotid bruit, no JVD, supple, symmetrical, trachea midline and thyroid not enlarged, symmetric, no tenderness/mass/nodules Back: symmetric, no curvature. ROM normal. No CVA tenderness. Lungs: clear to auscultation bilaterally Breasts: normal appearance, no masses or tenderness Heart: regular rate and rhythm, S1, S2 normal, no murmur, click, rub or gallop Abdomen: soft, non-tender; bowel sounds normal; no masses,  no organomegaly Pelvic: cervix normal in appearance, external genitalia normal, no adnexal masses or tenderness, no cervical motion tenderness, rectovaginal septum normal, uterus normal size, shape, and consistency, vagina normal without discharge and pap done Extremities: extremities normal, atraumatic, no cyanosis or edema Pulses: 2+ and symmetric Skin: Skin color, texture, turgor normal. No rashes or lesions Lymph nodes: Cervical, supraclavicular, and axillary nodes normal. Neurologic: Alert and oriented X 3,  normal strength and tone. Normal symmetric reflexes. Normal coordination and gait    Assessment:    Healthy female exam.      Plan:    ghm utd Check labs See After Visit Summary for Counseling Recommendations    1. Essential hypertension Check labs Well controlled, no changes to meds. Encouraged heart healthy diet such as the DASH diet and exercise as tolerated.  - Lipid panel - CBC with Differential/Platelet - Comprehensive metabolic panel - POCT Urinalysis Dipstick (Automated)  2. Hyperlipidemia LDL goal <100 Tolerating statin, encouraged heart healthy diet, avoid trans fats, minimize simple carbs and saturated fats. Increase exercise as tolerated - Lipid panel - CBC with Differential/Platelet - Comprehensive metabolic panel - POCT Urinalysis Dipstick (Automated)  3. Pap smear, as part of routine gynecological examination  - Cytology - PAP  4. Encounter for Medicare annual wellness exam

## 2016-09-24 LAB — COMPREHENSIVE METABOLIC PANEL
ALK PHOS: 51 U/L (ref 39–117)
ALT: 19 U/L (ref 0–35)
AST: 21 U/L (ref 0–37)
Albumin: 4.6 g/dL (ref 3.5–5.2)
BUN: 24 mg/dL — AB (ref 6–23)
CHLORIDE: 102 meq/L (ref 96–112)
CO2: 30 meq/L (ref 19–32)
Calcium: 9.8 mg/dL (ref 8.4–10.5)
Creatinine, Ser: 0.8 mg/dL (ref 0.40–1.20)
GFR: 75.01 mL/min (ref >60.00)
GLUCOSE: 95 mg/dL (ref 70–99)
POTASSIUM: 4.1 meq/L (ref 3.5–5.1)
SODIUM: 140 meq/L (ref 135–145)
Total Bilirubin: 0.7 mg/dL (ref 0.2–1.2)
Total Protein: 6.7 g/dL (ref 6.0–8.3)

## 2016-09-24 LAB — LIPID PANEL
CHOL/HDL RATIO: 3
Cholesterol: 199 mg/dL (ref 0–200)
HDL: 66.4 mg/dL (ref >39.00)
LDL Cholesterol: 120 mg/dL — ABNORMAL HIGH (ref 0–99)
NONHDL: 132.19
TRIGLYCERIDES: 63 mg/dL (ref 0.0–149.0)
VLDL: 12.6 mg/dL (ref 0.0–40.0)

## 2016-09-24 LAB — CBC WITH DIFFERENTIAL/PLATELET
Basophils Absolute: 0 10*3/uL (ref 0.0–0.1)
Basophils Relative: 0.3 % (ref 0.0–3.0)
EOS PCT: 2 % (ref 0.0–5.0)
Eosinophils Absolute: 0.1 10*3/uL (ref 0.0–0.7)
HCT: 44.6 % (ref 36.0–46.0)
Hemoglobin: 14.8 g/dL (ref 12.0–15.0)
LYMPHS PCT: 33.2 % (ref 12.0–46.0)
Lymphs Abs: 2.3 10*3/uL (ref 0.7–4.0)
MCHC: 33.1 g/dL (ref 30.0–36.0)
MCV: 96.4 fl (ref 78.0–100.0)
MONOS PCT: 8.4 % (ref 3.0–12.0)
Monocytes Absolute: 0.6 10*3/uL (ref 0.1–1.0)
NEUTROS PCT: 56.1 % (ref 43.0–77.0)
Neutro Abs: 3.9 10*3/uL (ref 1.4–7.7)
PLATELETS: 222 10*3/uL (ref 150.0–400.0)
RBC: 4.63 Mil/uL (ref 3.87–5.11)
RDW: 12.6 % (ref 11.5–15.5)
WBC: 6.9 10*3/uL (ref 4.0–10.5)

## 2016-09-26 LAB — CYTOLOGY - PAP
Diagnosis: NEGATIVE
HPV (WINDOPATH): NOT DETECTED

## 2016-11-26 ENCOUNTER — Telehealth: Payer: Self-pay | Admitting: Family Medicine

## 2016-11-26 DIAGNOSIS — E785 Hyperlipidemia, unspecified: Secondary | ICD-10-CM

## 2016-12-02 ENCOUNTER — Telehealth: Payer: Self-pay | Admitting: *Deleted

## 2016-12-02 NOTE — Telephone Encounter (Signed)
Received Provider Query from [employer] for Medical Record Clarification on Depression for Coding purposes, OV note attached; forwarded to provider/SLS 10/23   

## 2016-12-24 NOTE — Telephone Encounter (Signed)
Pt called in to follow up on refill request. Pt says that she have an apt with pcp. Pt would like to know if she could have a refill for 3 month supply?    Please assist further.    [phone] [place] - [phone] [facility] [address]

## 2016-12-25 ENCOUNTER — Other Ambulatory Visit: Payer: Self-pay | Admitting: Family Medicine

## 2016-12-25 DIAGNOSIS — E785 Hyperlipidemia, unspecified: Secondary | ICD-10-CM

## 2016-12-29 NOTE — Telephone Encounter (Signed)
Left message last week on machine for patient to call back. 2 rxs sent in on the 16th

## 2017-02-04 ENCOUNTER — Other Ambulatory Visit: Payer: Self-pay | Admitting: Family Medicine

## 2017-02-04 DIAGNOSIS — E785 Hyperlipidemia, unspecified: Secondary | ICD-10-CM

## 2017-03-13 ENCOUNTER — Other Ambulatory Visit: Payer: Self-pay | Admitting: Family Medicine

## 2017-03-17 ENCOUNTER — Other Ambulatory Visit: Payer: Self-pay | Admitting: Family Medicine

## 2017-03-17 DIAGNOSIS — E785 Hyperlipidemia, unspecified: Secondary | ICD-10-CM

## 2017-03-26 ENCOUNTER — Ambulatory Visit: Payer: Medicare Other | Admitting: Family Medicine

## 2017-03-26 ENCOUNTER — Encounter: Payer: Self-pay | Admitting: Family Medicine

## 2017-03-26 VITALS — BP 138/80 | HR 74 | Temp 97.7°F | Resp 16 | Ht 66.0 in | Wt 156.0 lb

## 2017-03-26 DIAGNOSIS — E785 Hyperlipidemia, unspecified: Secondary | ICD-10-CM | POA: Diagnosis not present

## 2017-03-26 DIAGNOSIS — Z23 Encounter for immunization: Secondary | ICD-10-CM | POA: Diagnosis not present

## 2017-03-26 DIAGNOSIS — M5440 Lumbago with sciatica, unspecified side: Secondary | ICD-10-CM | POA: Diagnosis not present

## 2017-03-26 DIAGNOSIS — I10 Essential (primary) hypertension: Secondary | ICD-10-CM

## 2017-03-26 DIAGNOSIS — F320 Major depressive disorder, single episode, mild: Secondary | ICD-10-CM

## 2017-03-26 LAB — COMPREHENSIVE METABOLIC PANEL
ALBUMIN: 4.4 g/dL (ref 3.5–5.2)
ALK PHOS: 65 U/L (ref 39–117)
ALT: 19 U/L (ref 0–35)
AST: 21 U/L (ref 0–37)
BILIRUBIN TOTAL: 0.8 mg/dL (ref 0.2–1.2)
BUN: 23 mg/dL (ref 6–23)
CALCIUM: 9.7 mg/dL (ref 8.4–10.5)
CO2: 29 mEq/L (ref 19–32)
CREATININE: 0.78 mg/dL (ref 0.40–1.20)
Chloride: 106 mEq/L (ref 96–112)
GFR: 77.12 mL/min (ref >60.00)
Glucose, Bld: 108 mg/dL — ABNORMAL HIGH (ref 70–99)
Potassium: 3.8 mEq/L (ref 3.5–5.1)
SODIUM: 141 meq/L (ref 135–145)
TOTAL PROTEIN: 7.4 g/dL (ref 6.0–8.3)

## 2017-03-26 LAB — LIPID PANEL
CHOLESTEROL: 185 mg/dL (ref 0–200)
HDL: 68.6 mg/dL (ref >39.00)
LDL Cholesterol: 102 mg/dL — ABNORMAL HIGH (ref 0–99)
NonHDL: 116.22
TRIGLYCERIDES: 69 mg/dL (ref 0.0–149.0)
Total CHOL/HDL Ratio: 3
VLDL: 13.8 mg/dL (ref 0.0–40.0)

## 2017-03-26 MED ORDER — SERTRALINE HCL 100 MG PO TABS: 150.0000 mg | ORAL_TABLET | Freq: Every day | ORAL | 3 refills | Status: DC

## 2017-03-26 MED ORDER — SIMVASTATIN 40 MG PO TABS: ORAL_TABLET | ORAL | 1 refills | Status: DC

## 2017-03-26 MED ORDER — LOSARTAN POTASSIUM-HCTZ 50-12.5 MG PO TABS: 1.0000 | ORAL_TABLET | Freq: Every day | ORAL | 1 refills | Status: DC

## 2017-03-26 MED ORDER — MELOXICAM 15 MG PO TABS: ORAL_TABLET | ORAL | 1 refills | Status: DC

## 2017-03-26 NOTE — Assessment & Plan Note (Signed)
Encouraged heart healthy diet, increase exercise, avoid trans fats, consider a krill oil cap daily 

## 2017-03-26 NOTE — Patient Instructions (Signed)

## 2017-03-26 NOTE — Assessment & Plan Note (Signed)
Well controlled, no changes to meds. Encouraged heart healthy diet such as the DASH diet and exercise as tolerated.  °

## 2017-03-26 NOTE — Progress Notes (Signed)
Patient ID: Glenda Lyons, female    DOB: February 21, 1945  Age: 72 y.o. MRN: [id number]    Subjective:  Subjective  HPI Glenda Lyons presents for f/u cholesterol and htn No complaints  Review of Systems  Constitutional: Negative for appetite change, diaphoresis, fatigue and unexpected weight change.  Eyes: Negative for pain, redness and visual disturbance.  Respiratory: Negative for cough, chest tightness, shortness of breath and wheezing.   Cardiovascular: Negative for chest pain, palpitations and leg swelling.  Endocrine: Negative for cold intolerance, heat intolerance, polydipsia, polyphagia and polyuria.  Genitourinary: Negative for difficulty urinating, dysuria and frequency.  Neurological: Negative for dizziness, light-headedness, numbness and headaches.    History Past Medical History:  Diagnosis Date  . Allergy    seasonal  . Depression   . Herniated disc   . Hyperlipemia   . Hypertension   . Neuromuscular disorder (HCC)   . Osteoarthritis   . Sciatica     She has a past surgical history that includes Tonsillectomy; Biopsy breast; LEEP (1994); Hysteroscopy with resectoscope (09/11/2010); Dilation and curettage of uterus (09/11/2010); Endometrial biopsy; Cystectomy; and Breast biopsy (Right).   Her family history includes Breast cancer in her mother; Heart attack in her unknown relative; Lymphoma in her unknown relative.She reports that  has never smoked. she has never used smokeless tobacco. She reports that she drinks alcohol. She reports that she does not use drugs.  Current Outpatient Medications on File Prior to Visit  Medication Sig Dispense Refill  . aspirin 81 MG tablet Take 81 mg by mouth daily.    . Calcium Carb-Cholecalciferol (CALCIUM-VITAMIN D) 500-400 MG-UNIT TABS Take by mouth daily.    . Flaxseed, Linseed, (FLAX SEED OIL) 1000 MG CAPS Take by mouth daily.    . fluticasone (FLONASE) 50 MCG/ACT nasal spray instill 2 sprays into each nostril at bedtime 16 g 2   . vitamin A 8000 UNIT capsule Take 8,000 Units by mouth daily.    . vitamin C (ASCORBIC ACID) 250 MG tablet Take 250 mg by mouth daily.     No current facility-administered medications on file prior to visit.      Objective:  Objective  Physical Exam  Constitutional: She is oriented to person, place, and time. She appears well-developed and well-nourished.  HENT:  Head: Normocephalic and atraumatic.  Eyes: Conjunctivae and EOM are normal.  Neck: Normal range of motion. Neck supple. No JVD present. Carotid bruit is not present. No thyromegaly present.  Cardiovascular: Normal rate, regular rhythm and normal heart sounds.  No murmur heard. Pulmonary/Chest: Effort normal and breath sounds normal. No respiratory distress. She has no wheezes. She has no rales. She exhibits no tenderness.  Musculoskeletal: She exhibits no edema.  Neurological: She is alert and oriented to person, place, and time.  Psychiatric: She has a normal mood and affect. Her behavior is normal. Judgment and thought content normal.  Nursing note and vitals reviewed.  BP 138/80 (BP Location: Left Arm, Cuff Size: Normal)   Pulse 74   Temp 97.7 F (36.5 C) (Oral)   Resp 16   Ht 5\' 6"  (1.676 m)   Wt 156 lb (70.8 kg)   SpO2 98%   BMI 25.18 kg/m  Wt Readings from Last 3 Encounters:  03/26/17 156 lb (70.8 kg)  09/23/16 153 lb 4 oz (69.5 kg)  03/25/16 152 lb 3.2 oz (69 kg)     Lab Results  Component Value Date   WBC 6.9 09/23/2016   HGB 14.8 09/23/2016  HCT 44.6 09/23/2016   PLT 222.0 09/23/2016   GLUCOSE 95 09/23/2016   CHOL 199 09/23/2016   TRIG 63.0 09/23/2016   HDL 66.40 09/23/2016   LDLCALC 120 (H) 09/23/2016   ALT 19 09/23/2016   AST 21 09/23/2016   NA 140 09/23/2016   K 4.1 09/23/2016   CL 102 09/23/2016   CREATININE 0.80 09/23/2016   BUN 24 (H) 09/23/2016   CO2 30 09/23/2016   TSH 3.19 12/11/2011   HGBA1C 6.1 (H) 12/15/2005    No results found.   Assessment & Plan:  Plan  I have  changed Glenda Lyons's sertraline. I am also having her maintain her fluticasone, aspirin, vitamin C, Calcium-Vitamin D, Flax Seed Oil, vitamin A, losartan-hydrochlorothiazide, meloxicam, and simvastatin.  Meds ordered this encounter  Medications  . losartan-hydrochlorothiazide (HYZAAR) 50-12.5 MG tablet    Sig: Take 1 tablet by mouth daily.    Dispense:  90 tablet    Refill:  1  . meloxicam (MOBIC) 15 MG tablet    Sig: take 1/2 to 1 tablet by mouth once daily if needed    Dispense:  90 tablet    Refill:  1  . simvastatin (ZOCOR) 40 MG tablet    Sig: TAKE 1 TABLET BY MOUTH EVERY EVENING AT BEDTIME    Dispense:  90 tablet    Refill:  1    Please place on file for next refill  . sertraline (ZOLOFT) 100 MG tablet    Sig: Take 1.5 tablets (150 mg total) by mouth daily.    Dispense:  135 tablet    Refill:  3    Please place on file for next refill    Problem List Items Addressed This Visit      Unprioritized   Essential hypertension - Primary    Well controlled, no changes to meds. Encouraged heart healthy diet such as the DASH diet and exercise as tolerated.       Relevant Medications   losartan-hydrochlorothiazide (HYZAAR) 50-12.5 MG tablet   simvastatin (ZOCOR) 40 MG tablet   Other Relevant Orders   Comprehensive metabolic panel   Lipid panel   Hyperlipidemia    Encouraged heart healthy diet, increase exercise, avoid trans fats, consider a krill oil cap daily      Relevant Medications   losartan-hydrochlorothiazide (HYZAAR) 50-12.5 MG tablet   simvastatin (ZOCOR) 40 MG tablet   Other Relevant Orders   Comprehensive metabolic panel   Lipid panel    Other Visit Diagnoses    Acute low back pain with sciatica, sciatica laterality unspecified, unspecified back pain laterality       Relevant Medications   meloxicam (MOBIC) 15 MG tablet   Depression, major, single episode, mild (HCC)       Relevant Medications   sertraline (ZOLOFT) 100 MG tablet      Follow-up:  Return in about 6 months (around 09/23/2017), or if symptoms worsen or fail to improve, for hypertension, hyperlipidemia, annual exam, fasting.  Donato SchultzYvonne R Lowne Chase, DO

## 2017-03-27 DIAGNOSIS — Z23 Encounter for immunization: Secondary | ICD-10-CM | POA: Diagnosis not present

## 2017-03-27 NOTE — Addendum Note (Signed)
Addended by: Thelma BargeICHARDSON, Rosmery Duggin D on: 03/27/2017 05:05 PM   Modules accepted: Orders

## 2017-03-31 ENCOUNTER — Other Ambulatory Visit: Payer: Self-pay | Admitting: *Deleted

## 2017-03-31 ENCOUNTER — Encounter: Payer: Self-pay | Admitting: *Deleted

## 2017-03-31 DIAGNOSIS — E785 Hyperlipidemia, unspecified: Secondary | ICD-10-CM

## 2017-03-31 DIAGNOSIS — I10 Essential (primary) hypertension: Secondary | ICD-10-CM

## 2017-07-10 ENCOUNTER — Other Ambulatory Visit (HOSPITAL_BASED_OUTPATIENT_CLINIC_OR_DEPARTMENT_OTHER): Payer: Self-pay | Admitting: Orthopedic Surgery

## 2017-07-10 DIAGNOSIS — M545 Low back pain, unspecified: Secondary | ICD-10-CM

## 2017-07-11 ENCOUNTER — Ambulatory Visit (HOSPITAL_BASED_OUTPATIENT_CLINIC_OR_DEPARTMENT_OTHER)
Admission: RE | Admit: 2017-07-11 | Discharge: 2017-07-11 | Disposition: A | Discharge status: Home / Self Care | Payer: Medicare Other | Source: Ambulatory Visit | Attending: Orthopedic Surgery | Admitting: Orthopedic Surgery

## 2017-07-11 DIAGNOSIS — M545 Low back pain, unspecified: Secondary | ICD-10-CM

## 2017-07-11 DIAGNOSIS — M5126 Other intervertebral disc displacement, lumbar region: Secondary | ICD-10-CM | POA: Insufficient documentation

## 2017-07-11 DIAGNOSIS — M48061 Spinal stenosis, lumbar region without neurogenic claudication: Secondary | ICD-10-CM | POA: Diagnosis not present

## 2017-07-11 DIAGNOSIS — M47816 Spondylosis without myelopathy or radiculopathy, lumbar region: Secondary | ICD-10-CM | POA: Diagnosis not present

## 2017-07-30 ENCOUNTER — Ambulatory Visit: Payer: Medicare Other | Admitting: Family Medicine

## 2017-08-04 ENCOUNTER — Encounter: Payer: Self-pay | Admitting: Family Medicine

## 2017-08-04 ENCOUNTER — Ambulatory Visit: Payer: Medicare Other | Admitting: Family Medicine

## 2017-08-04 VITALS — BP 145/64 | HR 63 | Temp 97.9°F | Resp 16 | Wt 162.4 lb

## 2017-08-04 DIAGNOSIS — M48061 Spinal stenosis, lumbar region without neurogenic claudication: Secondary | ICD-10-CM | POA: Diagnosis not present

## 2017-08-04 NOTE — Progress Notes (Signed)
Subjective:  I acted as a Neurosurgeon for [employer]. Fuller Song, RMA   Patient ID: Glenda Lyons, female    DOB: Apr 04, 1945, 72 y.o.   MRN: [id number]  Chief Complaint  Patient presents with  . discuss results    HPI  Patient is in today to discuss results of MRI. Pt saw Dr Charlett Blake and mri was done--- -Lumbar stenosis   Patient Care Team: Zola Button, Grayling Congress, DO as PCP - General Gentry Roch, MD as Referring Physician (Ophthalmology) Lunette Stands, MD as Consulting Physician (Orthopedic Surgery) Basilio Cairo, MD as Consulting Physician (Dermatology) Antionette Char Sharl Ma, OD (Optometry)   Past Medical History:  Diagnosis Date  . Allergy    seasonal  . Depression   . Herniated disc   . Hyperlipemia   . Hypertension   . Neuromuscular disorder (HCC)   . Osteoarthritis   . Sciatica     Past Surgical History:  Procedure Laterality Date  . BIOPSY BREAST    . BREAST BIOPSY Right   . CYSTECTOMY     leg  . DILATION AND CURETTAGE OF UTERUS  09/11/2010   Procedure: DILATATION AND CURETTAGE (D&C);  Surgeon: Michael Litter, MD;  Location: [facility];  Service: Gynecology;  Laterality: N/A;  . ENDOMETRIAL BIOPSY    . HYSTEROSCOPY WITH RESECTOSCOPE  09/11/2010   Procedure: HYSTEROSCOPY WITH RESECTOSCOPE;  Surgeon: Michael Litter, MD;  Location: [facility];  Service: Gynecology;  Laterality: N/A;  . LEEP  1994  . TONSILLECTOMY      Family History  Problem Relation Age of Onset  . Heart attack Unknown   . Lymphoma Unknown   . Breast cancer Mother   . Colon cancer Neg Hx     Social History   Socioeconomic History  . Marital status: Married    Spouse name: Not on file  . Number of children: Not on file  . Years of education: Not on file  . Highest education level: Not on file  Occupational History  . Not on file  Social Needs  . Financial resource strain: Not on file  . Food insecurity:    Worry: Not on file    Inability: Not on file  . Transportation needs:   Medical: Not on file    Non-medical: Not on file  Tobacco Use  . Smoking status: Never Smoker  . Smokeless tobacco: Never Used  Substance and Sexual Activity  . Alcohol use: Yes    Comment: occasionally  . Drug use: No  . Sexual activity: Yes    Partners: Male  Lifestyle  . Physical activity:    Days per week: Not on file    Minutes per session: Not on file  . Stress: Not on file  Relationships  . Social connections:    Talks on phone: Not on file    Gets together: Not on file    Attends religious service: Not on file    Active member of club or organization: Not on file    Attends meetings of clubs or organizations: Not on file    Relationship status: Not on file  . Intimate partner violence:    Fear of current or ex partner: Not on file    Emotionally abused: Not on file    Physically abused: Not on file    Forced sexual activity: Not on file  Other Topics Concern  . Not on file  Social History Narrative   Exercise-- just walking up and down the stairs  at home    Outpatient Medications Prior to Visit  Medication Sig Dispense Refill  . aspirin 81 MG tablet Take 81 mg by mouth daily.    . Calcium Carb-Cholecalciferol (CALCIUM-VITAMIN D) 500-400 MG-UNIT TABS Take by mouth daily.    . Flaxseed, Linseed, (FLAX SEED OIL) 1000 MG CAPS Take by mouth daily.    . fluticasone (FLONASE) 50 MCG/ACT nasal spray instill 2 sprays into each nostril at bedtime 16 g 2  . losartan-hydrochlorothiazide (HYZAAR) 50-12.5 MG tablet Take 1 tablet by mouth daily. 90 tablet 1  . meloxicam (MOBIC) 15 MG tablet take 1/2 to 1 tablet by mouth once daily if needed 90 tablet 1  . sertraline (ZOLOFT) 100 MG tablet Take 1.5 tablets (150 mg total) by mouth daily. 135 tablet 3  . simvastatin (ZOCOR) 40 MG tablet TAKE 1 TABLET BY MOUTH EVERY EVENING AT BEDTIME 90 tablet 1  . vitamin A 8000 UNIT capsule Take 8,000 Units by mouth daily.    . vitamin C (ASCORBIC ACID) 250 MG tablet Take 250 mg by mouth daily.      No facility-administered medications prior to visit.     Allergies  Allergen Reactions  . Almond Oil Other (See Comments)    Throat raw and scratchy  . Other Other (See Comments)    SUTURE MATERIAL- REDNESS, IRRITATION, TOOK A LONG TIME TO HEAL    ROS     Objective:    Physical Exam  Constitutional: She is oriented to person, place, and time. She appears well-developed and well-nourished.  HENT:  Head: Normocephalic and atraumatic.  Eyes: Conjunctivae and EOM are normal.  Neck: Normal range of motion. Neck supple. No JVD present. Carotid bruit is not present. No thyromegaly present.  Cardiovascular: Normal rate, regular rhythm and normal heart sounds.  No murmur heard. Pulmonary/Chest: Effort normal and breath sounds normal. No respiratory distress. She has no wheezes. She has no rales. She exhibits no tenderness.  Musculoskeletal: She exhibits tenderness. She exhibits no edema.  Neurological: She is alert and oriented to person, place, and time.  Psychiatric: She has a normal mood and affect.  Nursing note and vitals reviewed.   BP (!) 145/64 (BP Location: Left Arm, Patient Position: Sitting, Cuff Size: Normal)   Pulse 63   Temp 97.9 F (36.6 C) (Oral)   Resp 16   Wt 162 lb 6.4 oz (73.7 kg)   SpO2 100%   BMI 26.21 kg/m  Wt Readings from Last 3 Encounters:  08/04/17 162 lb 6.4 oz (73.7 kg)  03/26/17 156 lb (70.8 kg)  09/23/16 153 lb 4 oz (69.5 kg)   BP Readings from Last 3 Encounters:  08/04/17 (!) 145/64  03/26/17 138/80  09/23/16 120/78     Immunization History  Administered Date(s) Administered  . Influenza Split 12/11/2011, 10/01/2015  . Influenza Whole 09/19/2008  . Influenza, High Dose Seasonal PF 11/09/2012, 01/08/2014, 03/27/2017  . Influenza-Unspecified 11/11/2014  . Pneumococcal Conjugate-13 09/22/2013  . Pneumococcal Polysaccharide-23 03/13/2010  . Td 03/13/2010    Health Maintenance  Topic Date Due  . INFLUENZA VACCINE  09/10/2017  .  MAMMOGRAM  08/06/2018  . TETANUS/TDAP  03/13/2020  . COLONOSCOPY  06/16/2023  . DEXA SCAN  Completed  . Hepatitis C Screening  Completed  . PNA vac Low Risk Adult  Completed    Lab Results  Component Value Date   WBC 6.9 09/23/2016   HGB 14.8 09/23/2016   HCT 44.6 09/23/2016   PLT 222.0 09/23/2016   GLUCOSE  108 (H) 03/26/2017   CHOL 185 03/26/2017   TRIG 69.0 03/26/2017   HDL 68.60 03/26/2017   LDLCALC 102 (H) 03/26/2017   ALT 19 03/26/2017   AST 21 03/26/2017   NA 141 03/26/2017   K 3.8 03/26/2017   CL 106 03/26/2017   CREATININE 0.78 03/26/2017   BUN 23 03/26/2017   CO2 29 03/26/2017   TSH 3.19 12/11/2011   HGBA1C 6.1 (H) 12/15/2005    Lab Results  Component Value Date   TSH 3.19 12/11/2011   Lab Results  Component Value Date   WBC 6.9 09/23/2016   HGB 14.8 09/23/2016   HCT 44.6 09/23/2016   MCV 96.4 09/23/2016   PLT 222.0 09/23/2016   Lab Results  Component Value Date   NA 141 03/26/2017   K 3.8 03/26/2017   CO2 29 03/26/2017   GLUCOSE 108 (H) 03/26/2017   BUN 23 03/26/2017   CREATININE 0.78 03/26/2017   BILITOT 0.8 03/26/2017   ALKPHOS 65 03/26/2017   AST 21 03/26/2017   ALT 19 03/26/2017   PROT 7.4 03/26/2017   ALBUMIN 4.4 03/26/2017   CALCIUM 9.7 03/26/2017   GFR 77.12 03/26/2017   Lab Results  Component Value Date   CHOL 185 03/26/2017   Lab Results  Component Value Date   HDL 68.60 03/26/2017   Lab Results  Component Value Date   LDLCALC 102 (H) 03/26/2017   Lab Results  Component Value Date   TRIG 69.0 03/26/2017   Lab Results  Component Value Date   CHOLHDL 3 03/26/2017   Lab Results  Component Value Date   HGBA1C 6.1 (H) 12/15/2005      MR LUMBAR SPINE WO CONTRAST (Accession [id number]) (Order [id number])  Imaging  Date: 07/11/2017 Department: [facility] Released By: Theodis Sato Authorizing: Lunette Stands, MD  Exam Information   Status Exam Begun  Exam Ended   Final [99] 07/11/2017  11:50 AM 07/11/2017 12:30 PM  PACS Images   Show images for MR LUMBAR SPINE WO CONTRAST  Study Result   CLINICAL DATA:  Right side low back pain radiating into the right buttock and hip for 6 months since an injury lifting a box. History of prior lumbar surgery.  EXAM: MRI LUMBAR SPINE WITHOUT CONTRAST  TECHNIQUE: Multiplanar, multisequence MR imaging of the lumbar spine was performed. No intravenous contrast was administered.  COMPARISON:  MRI lumbar spine 01/17/2014.  FINDINGS: Segmentation:  Standard.  Alignment: 0.3 cm retrolisthesis L2 on L3 and 0.4 cm retrolisthesis L3 on L4 are new since the prior examination. 0.4 cm anterolisthesis L4 on L5 is unchanged.  Vertebrae: No fracture or worrisome lesion. The patient has undergone L4-5 PLIF since the prior examination. Scattered degenerative endplate signal change has progressed at L2-3 and L3-4.  Conus medullaris and cauda equina: Conus extends to the L1 level. Conus and cauda equina appear normal.  Paraspinal and other soft tissues: Small T2 hyperintense lesion right kidney is most consistent with a cyst.  Disc levels:  T11-12 is imaged in the sagittal plane only and negative.  T12-L1: Negative.  L1-2: Negative.  L2-3: Shallow disc bulge and mild facet degenerative change without central canal or foraminal narrowing is unchanged.  L3-4: There has been progression of disease at this level. Shallow disc bulge and endplate spur and ligamentum flavum thickening are seen. There is mild to moderate central canal stenosis. Moderately severe to severe bilateral foraminal narrowing is much worse than on the prior exam.  L4-5: Status post laminectomy and fusion since the prior exam. The central canal and foramina are open.  L5-S1: There is a shallow broad-based disc bulge without central canal stenosis. Mild bilateral foraminal narrowing is seen.  IMPRESSION: Status post L4-5 laminectomy and  fusion since the prior exam. The central canal and foramina are widely patent and no complicating feature is seen.  Worsened spondylosis at L3-4 where there is new 0.4 cm degenerative retrolisthesis. Disc bulge, ligamentum flavum thickening and facet degenerative disease cause mild to moderate central canal stenosis and moderately severe to severe bilateral foraminal narrowing.  No notable change in mild bilateral foraminal narrowing L5-S1.   Electronically Signed   By: Drusilla Kanner M.D.   On: 07/11/2017 15:29   Scans Related to Order [id number]   Scan on 07/10/2017 10:12 AM      Assessment & Plan:   Problem List Items Addressed This Visit    None    Visit Diagnoses    Spinal stenosis of lumbar region without neurogenic claudication    -  Primary   Relevant Orders   Ambulatory referral to Neurosurgery    pt requesting a referral to Dr Venetia Maxon  I am having Glenda Lyons. Cordone maintain her fluticasone, aspirin, vitamin C, Calcium-Vitamin D, Flax Seed Oil, vitamin A, losartan-hydrochlorothiazide, meloxicam, simvastatin, and sertraline.  No orders of the defined types were placed in this encounter.   CMA served as Neurosurgeon during this visit. History, Physical and Plan performed by medical provider. Documentation and orders reviewed and attested to.  Donato Schultz, DO

## 2017-08-04 NOTE — Patient Instructions (Signed)
Spinal Stenosis Spinal stenosis occurs when the open space (spinal canal) between the bones of your spine (vertebrae) narrows, putting pressure on the spinal cord or nerves. What are the causes? This condition is caused by areas of bone pushing into the central canals of your vertebrae. This condition may be present at birth (congenital), or it may be caused by:  Arthritic deterioration of your vertebrae (spinal degeneration). This usually starts around age 50.  Injury or trauma to the spine.  Tumors in the spine.  Calcium deposits in the spine.  What are the signs or symptoms? Symptoms of this condition include:  Pain in the neck or back that is generally worse with activities, particularly when standing and walking.  Numbness, tingling, hot or cold sensations, weakness, or weariness in your legs.  Pain going up and down the leg (sciatica).  Frequent episodes of falling.  A foot-slapping gait that leads to muscle weakness.  In more serious cases, you may develop:  Problemspassing stool or passing urine.  Difficulty having sex.  Loss of feeling in part or all of your leg.  Symptoms may come on slowly and get worse over time. How is this diagnosed? This condition is diagnosed based on your medical history and a physical exam. Tests will also be done, such as:  MRI.  CT scan.  X-ray.  How is this treated? Treatment for this condition often focuses on managing your pain and any other symptoms. Treatment may include:  Practicing good posture to lessen pressure on your nerves.  Exercising to strengthen muscles, build endurance, improve balance, and maintain good joint movement (range of motion).  Losing weight, if needed.  Taking medicines to reduce swelling, inflammation, or pain.  Assistive devices, such as a corset or brace.  In some cases, surgery may be needed. The most common procedure is decompression laminectomy. This is done to remove excess bone that  puts pressure on your nerve roots. Follow these instructions at home: Managing pain, stiffness, and swelling  Do all exercises and stretches as told by your health care provider.  Practice good posture. If you were given a brace or a corset, wear it as told by your health care provider.  Do not do any activities that cause pain. Ask your health care provider what activities are safe for you.  Do not lift anything that is heavier than 10 lb (4.5 kg) or the limit that your health care provider tells you.  Maintain a healthy weight. Talk with your health care provider if you need help losing weight.  If directed, apply heat to the affected area as often as told by your health care provider. Use the heat source that your health care provider recommends, such as a moist heat pack or a heating pad. ? Place a towel between your skin and the heat source. ? Leave the heat on for 20-30 minutes. ? Remove the heat if your skin turns bright red. This is especially important if you are not able to feel pain, heat, or cold. You may have a greater risk of getting burned. General instructions  Take over-the-counter and prescription medicines only as told by your health care provider.  Do not use any products that contain nicotine or tobacco, such as cigarettes and e-cigarettes. If you need help quitting, ask your health care provider.  Eat a healthy diet. This includes plenty of fruits and vegetables, whole grains, and low-fat (lean) protein.  Keep all follow-up visits as told by your health care provider.   This is important. Contact a health care provider if:  Your symptoms do not get better or they get worse.  You have a fever. Get help right away if:  You have new or worse pain in your neck or upper back.  You have severe pain that cannot be controlled with medicines.  You are dizzy.  You have vision problems, blurred vision, or double vision.  You have a severe headache that is worse when  you stand.  You have nausea or you vomit.  You develop new or worse numbness or tingling in your back or legs.  You have pain, redness, swelling, or warmth in your arm or leg. Summary  Spinal stenosis occurs when the open space (spinal canal) between the bones of your spine (vertebrae) narrows. This narrowing puts pressure on the spinal cord or nerves.  Spinal stenosis can cause numbness, weakness, or pain in the neck, back, and legs.  This condition may be caused by a birth defect, arthritic deterioration of your vertebrae, injury, tumors, or calcium deposits.  This condition is usually diagnosed with MRIs, CT scans, and X-rays. This information is not intended to replace advice given to you by your health care provider. Make sure you discuss any questions you have with your health care provider. Document Released: 04/19/2003 Document Revised: 01/02/2016 Document Reviewed: 01/02/2016 Elsevier Interactive Patient Education  2018 Elsevier Inc[employer]  

## 2017-09-18 NOTE — Progress Notes (Deleted)
Subjective:   Glenda Lyons is a 72 y.o. female who presents for Medicare Annual (Subsequent) preventive examination.  Review of Systems: No ROS.  Medicare Wellness Visit. Additional risk factors are reflected in the social history.   Sleep patterns: Home Safety/Smoke Alarms: Feels safe in home. Smoke alarms in place.  Living environment; residence and Firearm Safety:    Female:   Pap-  utd     Mammo-       Dexa scan-utd   CCS- due 06/2023    Objective:     Vitals: There were no vitals taken for this visit.  There is no height or weight on file to calculate BMI.  Advanced Directives 09/23/2016 06/01/2015 09/04/2010  Does Patient Have a Medical Advance Directive? No Yes Patient does not have advance directive  Type of Advance Directive - Healthcare Power of [place];Living will -  Does patient want to make changes to medical advance directive? - No - Patient declined -  Copy of Healthcare Power of Attorney in Chart? - No - copy requested -  Would patient like information on creating a medical advance directive? Yes (MAU/Ambulatory/Procedural Areas - Information given) - -    Tobacco Social History   Tobacco Use  Smoking Status Never Smoker  Smokeless Tobacco Never Used     Counseling given: Not Answered   Clinical Intake:                       Past Medical History:  Diagnosis Date  . Allergy    seasonal  . Depression   . Herniated disc   . Hyperlipemia   . Hypertension   . Neuromuscular disorder (HCC)   . Osteoarthritis   . Sciatica    Past Surgical History:  Procedure Laterality Date  . BIOPSY BREAST    . BREAST BIOPSY Right   . CYSTECTOMY     leg  . DILATION AND CURETTAGE OF UTERUS  09/11/2010   Procedure: DILATATION AND CURETTAGE (D&C);  Surgeon: Michael LitterNaima A Dillard, MD;  Location: [facility];  Service: Gynecology;  Laterality: N/A;  . ENDOMETRIAL BIOPSY    . HYSTEROSCOPY WITH RESECTOSCOPE  09/11/2010   Procedure: HYSTEROSCOPY WITH RESECTOSCOPE;   Surgeon: Michael LitterNaima A Dillard, MD;  Location: [facility];  Service: Gynecology;  Laterality: N/A;  . LEEP  1994  . TONSILLECTOMY     Family History  Problem Relation Age of Onset  . Heart attack Unknown   . Lymphoma Unknown   . Breast cancer Mother   . Colon cancer Neg Hx    Social History   Socioeconomic History  . Marital status: Married    Spouse name: Not on file  . Number of children: Not on file  . Years of education: Not on file  . Highest education level: Not on file  Occupational History  . Not on file  Social Needs  . Financial resource strain: Not on file  . Food insecurity:    Worry: Not on file    Inability: Not on file  . Transportation needs:    Medical: Not on file    Non-medical: Not on file  Tobacco Use  . Smoking status: Never Smoker  . Smokeless tobacco: Never Used  Substance and Sexual Activity  . Alcohol use: Yes    Comment: occasionally  . Drug use: No  . Sexual activity: Yes    Partners: Male  Lifestyle  . Physical activity:    Days per week: Not on  file    Minutes per session: Not on file  . Stress: Not on file  Relationships  . Social connections:    Talks on phone: Not on file    Gets together: Not on file    Attends religious service: Not on file    Active member of club or organization: Not on file    Attends meetings of clubs or organizations: Not on file    Relationship status: Not on file  Other Topics Concern  . Not on file  Social History Narrative   Exercise-- just walking up and down the stairs at home    Outpatient Encounter Medications as of 09/24/2017  Medication Sig  . aspirin 81 MG tablet Take 81 mg by mouth daily.  . Calcium Carb-Cholecalciferol (CALCIUM-VITAMIN D) 500-400 MG-UNIT TABS Take by mouth daily.  . Flaxseed, Linseed, (FLAX SEED OIL) 1000 MG CAPS Take by mouth daily.  . fluticasone (FLONASE) 50 MCG/ACT nasal spray instill 2 sprays into each nostril at bedtime  . losartan-hydrochlorothiazide (HYZAAR) 50-12.5 MG  tablet Take 1 tablet by mouth daily.  . meloxicam (MOBIC) 15 MG tablet take 1/2 to 1 tablet by mouth once daily if needed  . sertraline (ZOLOFT) 100 MG tablet Take 1.5 tablets (150 mg total) by mouth daily.  . simvastatin (ZOCOR) 40 MG tablet TAKE 1 TABLET BY MOUTH EVERY EVENING AT BEDTIME  . vitamin A 8000 UNIT capsule Take 8,000 Units by mouth daily.  . vitamin C (ASCORBIC ACID) 250 MG tablet Take 250 mg by mouth daily.   No facility-administered encounter medications on file as of 09/24/2017.     Activities of Daily Living In your present state of health, do you have any difficulty performing the following activities: 09/23/2016  Hearing? N  Vision? N  Difficulty concentrating or making decisions? N  Walking or climbing stairs? N  Dressing or bathing? N  Doing errands, shopping? N  Preparing Food and eating ? N  Using the Toilet? N  In the past six months, have you accidently leaked urine? N  Do you have problems with loss of bowel control? N  Managing your Medications? N  Managing your Finances? N  Housekeeping or managing your Housekeeping? N  Some recent data might be hidden    Patient Care Team: Zola Button, Grayling Congress, DO as PCP - General Gentry Roch, MD as Referring Physician (Ophthalmology) Lunette Stands, MD as Consulting Physician (Orthopedic Surgery) Basilio Cairo, MD as Consulting Physician (Dermatology) Antionette Char Sharl Ma, OD (Optometry)    Assessment:   This is a routine wellness examination for Sheletha. Physical assessment deferred to PCP.  Exercise Activities and Dietary recommendations   Diet (meal preparation, eat out, water intake, caffeinated beverages, dairy products, fruits and vegetables): {Desc; diets:16563} Breakfast: Lunch:  Dinner:      Goals    . Increase physical activity (pt-stated)     Starting 09/23/2016, I will renew my [employer] membership and try to swim at least twice weekly for at least 10-15 minutes each time.        Fall Risk Fall Risk  09/23/2016 09/21/2015 06/01/2015 06/01/2015 09/22/2013  Falls in the past year? No No No No No    Depression Screen PHQ 2/9 Scores 09/23/2016 03/25/2016 09/21/2015 06/01/2015  PHQ - 2 Score 0 0 0 0     Cognitive Function MMSE - Mini Mental State Exam 09/23/2016  Orientation to time 5  Orientation to Place 5  Registration 3  Attention/ Calculation 5  Recall  2  Language- name 2 objects 2  Language- repeat 1  Language- follow 3 step command 3  Language- read & follow direction 1  Write a sentence 1  Copy design 1  Total score 29        Immunization History  Administered Date(s) Administered  . Influenza Split 12/11/2011, 10/01/2015  . Influenza Whole 09/19/2008  . Influenza, High Dose Seasonal PF 11/09/2012, 01/08/2014, 03/27/2017  . Influenza-Unspecified 11/11/2014  . Pneumococcal Conjugate-13 09/22/2013  . Pneumococcal Polysaccharide-23 03/13/2010  . Td 03/13/2010   Screening Tests Health Maintenance  Topic Date Due  . INFLUENZA VACCINE  09/10/2017  . MAMMOGRAM  08/06/2018  . TETANUS/TDAP  03/13/2020  . COLONOSCOPY  06/16/2023  . DEXA SCAN  Completed  . Hepatitis C Screening  Completed  . PNA vac Low Risk Adult  Completed    Plan:   ***   I have personally reviewed and noted the following in the patient's chart:   . Medical and social history . Use of alcohol, tobacco or illicit drugs  . Current medications and supplements . Functional ability and status . Nutritional status . Physical activity . Advanced directives . List of other physicians . Hospitalizations, surgeries, and ER visits in previous 12 months . Vitals . Screenings to include cognitive, depression, and falls . Referrals and appointments  In addition, I have reviewed and discussed with patient certain preventive protocols, quality metrics, and best practice recommendations. A written personalized care plan for preventive services as well as general preventive health  recommendations were provided to patient.     [place]on Gully, [place]  09/18/2017

## 2017-09-24 ENCOUNTER — Ambulatory Visit: Payer: Medicare Other | Admitting: *Deleted

## 2017-11-05 ENCOUNTER — Other Ambulatory Visit: Payer: Self-pay | Admitting: Family Medicine

## 2017-11-05 DIAGNOSIS — Z1231 Encounter for screening mammogram for malignant neoplasm of breast: Secondary | ICD-10-CM

## 2017-11-12 ENCOUNTER — Other Ambulatory Visit: Payer: Self-pay | Admitting: Family Medicine

## 2017-11-12 DIAGNOSIS — E785 Hyperlipidemia, unspecified: Secondary | ICD-10-CM

## 2017-11-12 DIAGNOSIS — I10 Essential (primary) hypertension: Secondary | ICD-10-CM

## 2017-12-03 ENCOUNTER — Ambulatory Visit
Admission: RE | Admit: 2017-12-03 | Discharge: 2017-12-03 | Disposition: A | Discharge status: Home / Self Care | Payer: Medicare Other | Source: Ambulatory Visit | Attending: Family Medicine | Admitting: Family Medicine

## 2017-12-03 DIAGNOSIS — Z1231 Encounter for screening mammogram for malignant neoplasm of breast: Secondary | ICD-10-CM

## 2018-03-09 ENCOUNTER — Other Ambulatory Visit: Payer: Self-pay | Admitting: Family Medicine

## 2018-03-09 DIAGNOSIS — M5440 Lumbago with sciatica, unspecified side: Secondary | ICD-10-CM

## 2018-03-19 ENCOUNTER — Other Ambulatory Visit: Payer: Self-pay | Admitting: Family Medicine

## 2018-03-19 DIAGNOSIS — I10 Essential (primary) hypertension: Secondary | ICD-10-CM

## 2018-03-19 DIAGNOSIS — E785 Hyperlipidemia, unspecified: Secondary | ICD-10-CM

## 2018-06-05 ENCOUNTER — Other Ambulatory Visit: Payer: Self-pay | Admitting: Family Medicine

## 2018-06-05 DIAGNOSIS — F320 Major depressive disorder, single episode, mild: Secondary | ICD-10-CM

## 2018-07-30 ENCOUNTER — Other Ambulatory Visit: Payer: Self-pay | Admitting: Family Medicine

## 2018-07-30 DIAGNOSIS — E785 Hyperlipidemia, unspecified: Secondary | ICD-10-CM

## 2018-09-14 ENCOUNTER — Other Ambulatory Visit: Payer: Self-pay | Admitting: Family Medicine

## 2018-09-14 DIAGNOSIS — I10 Essential (primary) hypertension: Secondary | ICD-10-CM

## 2018-09-17 ENCOUNTER — Other Ambulatory Visit: Payer: Self-pay | Admitting: *Deleted

## 2018-09-17 DIAGNOSIS — F320 Major depressive disorder, single episode, mild: Secondary | ICD-10-CM

## 2018-09-17 MED ORDER — SERTRALINE HCL 100 MG PO TABS: ORAL_TABLET | ORAL | 0 refills | Status: DC

## 2018-12-17 ENCOUNTER — Other Ambulatory Visit: Payer: Self-pay

## 2018-12-17 ENCOUNTER — Telehealth: Payer: Self-pay | Admitting: Family Medicine

## 2018-12-17 DIAGNOSIS — E785 Hyperlipidemia, unspecified: Secondary | ICD-10-CM

## 2018-12-17 DIAGNOSIS — F320 Major depressive disorder, single episode, mild: Secondary | ICD-10-CM

## 2018-12-17 DIAGNOSIS — I10 Essential (primary) hypertension: Secondary | ICD-10-CM

## 2018-12-17 MED ORDER — LOSARTAN POTASSIUM-HCTZ 50-12.5 MG PO TABS: 1.0000 | ORAL_TABLET | Freq: Every day | ORAL | 0 refills | Status: DC

## 2018-12-17 MED ORDER — SERTRALINE HCL 100 MG PO TABS: ORAL_TABLET | ORAL | 0 refills | Status: DC

## 2018-12-17 MED ORDER — SIMVASTATIN 40 MG PO TABS: ORAL_TABLET | ORAL | 1 refills | Status: DC

## 2018-12-17 NOTE — Telephone Encounter (Signed)
Pt called in to follow up on a letter that she dropped off at the office on Wednesday to provider. Pt says that she was expecting a call from PCP to assist her.    Pt says that she was going over a few things in the letter, she also was requesting a refill on medication.   Pt is requesting a refill for:  sertraline (ZOLOFT) 100 MG tablet losartan-hydrochlorothiazide (HYZAAR) 50-12.5 MG tablet  simvastatin (ZOCOR) 40 MG tablet   Pharmacy: [facility]  (she is no longer using pharmacy on file)   [phone]

## 2018-12-17 NOTE — Telephone Encounter (Signed)
Left VM. Advised patient that we understood not wanting to be seen in the office but Dr. Etter Sjogren wants her to be seen virtual or telephone visit. Sent in refills to pharmacy on file then canceled due to the wrong pharmacy. Will send to [facility].

## 2019-01-05 ENCOUNTER — Other Ambulatory Visit: Payer: Self-pay

## 2019-01-05 DIAGNOSIS — Z20822 Contact with and (suspected) exposure to covid-19: Secondary | ICD-10-CM

## 2019-01-07 LAB — NOVEL CORONAVIRUS, NAA: SARS-CoV-2, NAA: NOT DETECTED

## 2019-01-25 ENCOUNTER — Ambulatory Visit: Payer: Self-pay | Admitting: *Deleted

## 2019-01-25 ENCOUNTER — Other Ambulatory Visit: Payer: Self-pay | Admitting: Family Medicine

## 2019-01-25 ENCOUNTER — Other Ambulatory Visit: Payer: Self-pay

## 2019-01-25 ENCOUNTER — Ambulatory Visit (INDEPENDENT_AMBULATORY_CARE_PROVIDER_SITE_OTHER): Payer: Medicare Other | Admitting: Family Medicine

## 2019-01-25 ENCOUNTER — Telehealth: Payer: Self-pay | Admitting: Cardiology

## 2019-01-25 ENCOUNTER — Encounter: Payer: Self-pay | Admitting: Family Medicine

## 2019-01-25 VITALS — BP 160/100 | HR 72 | Temp 97.1°F | Resp 18 | Ht 66.0 in | Wt 155.4 lb

## 2019-01-25 DIAGNOSIS — I447 Left bundle-branch block, unspecified: Secondary | ICD-10-CM

## 2019-01-25 DIAGNOSIS — R002 Palpitations: Secondary | ICD-10-CM

## 2019-01-25 DIAGNOSIS — I10 Essential (primary) hypertension: Secondary | ICD-10-CM

## 2019-01-25 DIAGNOSIS — R8290 Unspecified abnormal findings in urine: Secondary | ICD-10-CM

## 2019-01-25 DIAGNOSIS — E785 Hyperlipidemia, unspecified: Secondary | ICD-10-CM

## 2019-01-25 HISTORY — DX: Left bundle-branch block, unspecified: I44.7

## 2019-01-25 HISTORY — DX: Palpitations: R00.2

## 2019-01-25 LAB — POCT URINALYSIS DIP (MANUAL ENTRY)
Bilirubin, UA: NEGATIVE
Glucose, UA: NEGATIVE mg/dL
Ketones, POC UA: NEGATIVE mg/dL
Nitrite, UA: NEGATIVE
Protein Ur, POC: NEGATIVE mg/dL
Spec Grav, UA: 1.02 (ref 1.010–1.025)
Urobilinogen, UA: 0.2 E.U./dL
pH, UA: 6 (ref 5.0–8.0)

## 2019-01-25 LAB — LIPID PANEL
Cholesterol: 231 mg/dL — ABNORMAL HIGH (ref 0–200)
HDL: 69 mg/dL (ref >39.00)
LDL Cholesterol: 139 mg/dL — ABNORMAL HIGH (ref 0–99)
NonHDL: 161.89
Total CHOL/HDL Ratio: 3
Triglycerides: 115 mg/dL (ref 0.0–149.0)
VLDL: 23 mg/dL (ref 0.0–40.0)

## 2019-01-25 LAB — CBC WITH DIFFERENTIAL/PLATELET
Basophils Absolute: 0 10*3/uL (ref 0.0–0.1)
Basophils Relative: 0.4 % (ref 0.0–3.0)
Eosinophils Absolute: 0.1 10*3/uL (ref 0.0–0.7)
Eosinophils Relative: 1.5 % (ref 0.0–5.0)
HCT: 46.5 % — ABNORMAL HIGH (ref 36.0–46.0)
Hemoglobin: 15.4 g/dL — ABNORMAL HIGH (ref 12.0–15.0)
Lymphocytes Relative: 24.9 % (ref 12.0–46.0)
Lymphs Abs: 2.1 10*3/uL (ref 0.7–4.0)
MCHC: 33.2 g/dL (ref 30.0–36.0)
MCV: 94.2 fl (ref 78.0–100.0)
Monocytes Absolute: 0.7 10*3/uL (ref 0.1–1.0)
Monocytes Relative: 8.3 % (ref 3.0–12.0)
Neutro Abs: 5.5 10*3/uL (ref 1.4–7.7)
Neutrophils Relative %: 64.9 % (ref 43.0–77.0)
Platelets: 250 10*3/uL (ref 150.0–400.0)
RBC: 4.93 Mil/uL (ref 3.87–5.11)
RDW: 12.8 % (ref 11.5–15.5)
WBC: 8.5 10*3/uL (ref 4.0–10.5)

## 2019-01-25 LAB — COMPREHENSIVE METABOLIC PANEL
ALT: 20 U/L (ref 0–35)
AST: 20 U/L (ref 0–37)
Albumin: 4.8 g/dL (ref 3.5–5.2)
Alkaline Phosphatase: 74 U/L (ref 39–117)
BUN: 20 mg/dL (ref 6–23)
CO2: 30 mEq/L (ref 19–32)
Calcium: 10.1 mg/dL (ref 8.4–10.5)
Chloride: 101 mEq/L (ref 96–112)
Creatinine, Ser: 0.76 mg/dL (ref 0.40–1.20)
GFR: 74.39 mL/min (ref >60.00)
Glucose, Bld: 84 mg/dL (ref 70–99)
Potassium: 4.9 mEq/L (ref 3.5–5.1)
Sodium: 139 mEq/L (ref 135–145)
Total Bilirubin: 0.5 mg/dL (ref 0.2–1.2)
Total Protein: 7.4 g/dL (ref 6.0–8.3)

## 2019-01-25 LAB — TSH: TSH: 3.5 u[IU]/mL (ref 0.35–4.50)

## 2019-01-25 MED ORDER — LOSARTAN POTASSIUM-HCTZ 100-25 MG PO TABS: 1.0000 | ORAL_TABLET | Freq: Every day | ORAL | 3 refills | Status: DC

## 2019-01-25 NOTE — Telephone Encounter (Signed)
Dr. Curt Bears spoke with Dr. Etter Sjogren. Dr. Etter Sjogren will place referral order for HTN & new LBBB

## 2019-01-25 NOTE — Telephone Encounter (Signed)
Pt scheduled for today.  

## 2019-01-25 NOTE — Patient Instructions (Signed)
Left Bundle Branch Block  Left bundle branch block (LBBB) is a problem with the way that electrical impulses pass through the heart (electrical conduction abnormality). The heart depends on an electrical pulse to beat normally. The electrical signal for a heartbeat starts in the upper chambers of the heart (atria) and then travels to the two lower chambers (left and rightventricles). An LBBB is a partial or complete block of the pathway that carries the signal to the left ventricle. If you have LBBB, the left side of your heart does not beat normally. LBBB may be a warning sign of heart disease. What are the causes? This condition may be caused by:  Heart disease.  Disease of the arteries in the heart (arteriosclerosis).  Stiffening or weakening of heart muscle (cardiomyopathy).  Infection of heart muscle (myocarditis).  High blood pressure (hypertension). In some cases, the cause may not be known. What increases the risk? The following factors may make you more likely to develop this condition:  Being female.  Being 50 years of age or older.  Having heart disease.  Having had a heart attack or heart surgery. What are the signs or symptoms? This condition may not cause any symptoms. If you do have symptoms, they may include:  Feeling dizzy or light-headed.  Fainting. How is this diagnosed? This condition may be diagnosed based on an electrocardiogram (ECG). It is often diagnosed when an ECG is done as part of a routine physical or to help find the cause of fainting spells. You may also have imaging tests to find out more about your condition. These may include:  Chest X-rays.  Echo test (echocardiogram). How is this treated? If you do not have symptoms or any other type of heart disease, you may not need treatment for this condition. However, you may need to see your health care provider more often because LBBB can be a warning sign of future heart problems. You may get  treatment for other heart problems or high blood pressure. If LBBB causes symptoms or other heart problems, you may need to have an electrical device (pacemaker) implanted under the skin of your chest. A pacemaker sends electrical signals to your heart to keep it beating normally. Follow these instructions at home:  Follow instructions from your health care provider about eating or drinking restrictions.  Take over-the-counter and prescription medicines only as told by your health care provider.  Return to your normal activities as told by your health care provider. Ask your health care provider what activities are safe for you.  Follow a heart-healthy diet and maintain a healthy weight. Work with a diet and nutrition specialist (dietitian) to create an eating plan that is best for you.  Get regular exercise as told by your health care provider.  Do not use any products that contain nicotine or tobacco, such as cigarettes and e-cigarettes. If you need help quitting, ask your health care provider.  Keep all follow-up visits as told by your health care provider. This is important. Contact a health care provider if:  You are light-headed or you faint. Get help right away if:  You have chest pain.  You have difficulty breathing. These symptoms may represent a serious problem that is an emergency. Do not wait to see if the symptoms will go away. Get medical help right away. Call your local emergency services (911 in the [place] Do not drive yourself to the hospital. Summary  For the heart to beat normally, an electrical signal must   travel to the lower left chamber of the heart (left ventricle). Left bundle branch block (LBBB) is a partial or complete block of the pathway that carries that signal.  This condition may not cause any symptoms. In some cases, a person may feel dizzy or light-headed or may faint.  Treatment may not be needed for LBBB if you do not have symptoms or any other type  of heart disease.  You may need to see your health care provider more often because LBBB can be a warning sign of future heart problems. This information is not intended to replace advice given to you by your health care provider. Make sure you discuss any questions you have with your health care provider. Document Released: 03/18/2016 Document Revised: 01/09/2017 Document Reviewed: 03/18/2016 Elsevier Patient Education  2020 Elsevier Inc.  

## 2019-01-25 NOTE — Telephone Encounter (Signed)
New Message  Dr. Elijio Miles is calling in to speak with the DOD. Transferred to DOD.

## 2019-01-25 NOTE — Telephone Encounter (Signed)
  Reason for Disposition . Systolic BP  >= 789 OR Diastolic >= 381  Answer Assessment - Initial Assessment Questions 1. BLOOD PRESSURE: "What is the blood pressure?" "Did you take at least two measurements 5 minutes apart?"     191/98, 168/100, and 154/90 2. ONSET: "When did you take your blood pressure?"     01/25/2019 at 0530, and 0845 3. HOW: "How did you obtain the blood pressure?" (e.g., visiting nurse, automatic home BP monitor)     Home cuff left and right wrist 4. HISTORY: "Do you have a history of high blood pressure?"     yes 5. MEDICATIONS: "Are you taking any medications for blood pressure?" "Have you missed any doses recently?"     Yes, no missed doses 6. OTHER SYMPTOMS: "Do you have any symptoms?" (e.g., headache, chest pain, blurred vision, difficulty breathing, weakness)     no 7. PREGNANCY: "Is there any chance you are pregnant?" "When was your last menstrual period?"    no  Protocols used: HIGH BLOOD PRESSURE-A-AH

## 2019-01-25 NOTE — Assessment & Plan Note (Signed)
D/w cardiology Refer to cards Adjust bp meds  Check echo  F/u 2-3 weeks if unable to get into cards

## 2019-01-25 NOTE — Progress Notes (Signed)
Patient ID: Glenda Lyons, female    DOB: [birth year]  Age: 73 y.o. MRN: [id number]    Subjective:  Subjective  HPI Glenda Lyons presents for f/u bp.  Its been running high the last few days.   No cp , no sob, no palpitations   She can feel her pulse in her ears    Review of Systems  Constitutional: Negative for appetite change, diaphoresis, fatigue and unexpected weight change.  Eyes: Negative for pain, redness and visual disturbance.  Respiratory: Negative for cough, chest tightness, shortness of breath and wheezing.   Cardiovascular: Negative for chest pain, palpitations and leg swelling.  Endocrine: Negative for cold intolerance, heat intolerance, polydipsia, polyphagia and polyuria.  Genitourinary: Negative for difficulty urinating, dysuria and frequency.  Neurological: Negative for dizziness, light-headedness, numbness and headaches.    History Past Medical History:  Diagnosis Date  . Allergy    seasonal  . Depression   . Herniated disc   . Hyperlipemia   . Hypertension   . Neuromuscular disorder (HCC)   . Osteoarthritis   . Sciatica     She has a past surgical history that includes Tonsillectomy; Biopsy breast; LEEP (1994); Hysteroscopy with resectoscope (09/11/2010); Dilation and curettage of uterus (09/11/2010); Endometrial biopsy; Cystectomy; and Breast biopsy (Right).   Her family history includes Breast cancer in her mother; Heart attack in her unknown relative; Lymphoma in her unknown relative.She reports that she has never smoked. She has never used smokeless tobacco. She reports current alcohol use. She reports that she does not use drugs.  Current Outpatient Medications on File Prior to Visit  Medication Sig Dispense Refill  . aspirin 81 MG tablet Take 81 mg by mouth daily.    . Calcium Carb-Cholecalciferol (CALCIUM-VITAMIN D) 500-400 MG-UNIT TABS Take by mouth daily.    . Flaxseed, Linseed, (FLAX SEED OIL) 1000 MG CAPS Take by mouth daily.    . fluticasone  (FLONASE) 50 MCG/ACT nasal spray instill 2 sprays into each nostril at bedtime 16 g 2  . meloxicam (MOBIC) 15 MG tablet TAKE 1/2 TO 1 TABLET BY MOUTH EVERY DAY IF NEEDED 90 tablet 1  . sertraline (ZOLOFT) 100 MG tablet TABLET 1&1/2 TABLET BY MOUTH EVERY DAY.  Needs ov before any more refills. 45 tablet 0  . simvastatin (ZOCOR) 40 MG tablet TAKE 1 TABLET(40 MG) BY MOUTH AT BEDTIME 30 tablet 1  . vitamin A 8000 UNIT capsule Take 8,000 Units by mouth daily.    . vitamin C (ASCORBIC ACID) 250 MG tablet Take 250 mg by mouth daily.     No current facility-administered medications on file prior to visit.     Objective:  Objective  Physical Exam Nursing note reviewed. Exam conducted with a chaperone present.  Constitutional:      Appearance: She is well-developed.  HENT:     Head: Normocephalic and atraumatic.  Eyes:     Conjunctiva/sclera: Conjunctivae normal.  Neck:     Thyroid: No thyromegaly.     Vascular: No carotid bruit or JVD.  Cardiovascular:     Rate and Rhythm: Normal rate and regular rhythm.     Heart sounds: Normal heart sounds. No murmur.  Pulmonary:     Effort: Pulmonary effort is normal. No respiratory distress.     Breath sounds: Normal breath sounds. No wheezing or rales.  Chest:     Chest wall: No tenderness.  Musculoskeletal:     Cervical back: Normal range of motion and neck supple.  Neurological:  Mental Status: She is alert and oriented to person, place, and time.    BP (!) 160/100 (BP Location: Right Arm, Patient Position: Sitting, Cuff Size: Normal)   Pulse 72   Temp (!) 97.1 F (36.2 C) (Temporal)   Resp 18   Ht 5\' 6"  (1.676 m)   Wt 155 lb 6.4 oz (70.5 kg)   SpO2 97%   BMI 25.08 kg/m  Wt Readings from Last 3 Encounters:  01/25/19 155 lb 6.4 oz (70.5 kg)  08/04/17 162 lb 6.4 oz (73.7 kg)  03/26/17 156 lb (70.8 kg)   EKG--  New LBBB -- was able to see old ekg from 03/28/17 in 2016 ---  nsr   Lab Results  Component Value Date   WBC 8.5  01/25/2019   HGB 15.4 (H) 01/25/2019   HCT 46.5 (H) 01/25/2019   PLT 250.0 01/25/2019   GLUCOSE 84 01/25/2019   CHOL 231 (H) 01/25/2019   TRIG 115.0 01/25/2019   HDL 69.00 01/25/2019   LDLCALC 139 (H) 01/25/2019   ALT 20 01/25/2019   AST 20 01/25/2019   NA 139 01/25/2019   K 4.9 01/25/2019   CL 101 01/25/2019   CREATININE 0.76 01/25/2019   BUN 20 01/25/2019   CO2 30 01/25/2019   TSH 3.50 01/25/2019   HGBA1C 6.1 (H) 12/15/2005    MM 3D SCREEN BREAST BILATERAL  Result Date: 12/04/2017 CLINICAL DATA:  Screening. EXAM: DIGITAL SCREENING BILATERAL MAMMOGRAM WITH TOMO AND CAD COMPARISON:  Previous exam(s). ACR Breast Density Category c: The breast tissue is heterogeneously dense, which may obscure small masses. FINDINGS: There are no findings suspicious for malignancy. Images were processed with CAD. IMPRESSION: No mammographic evidence of malignancy. A result letter of this screening mammogram will be mailed directly to the patient. RECOMMENDATION: Screening mammogram in one year. (Code:SM-B-01Y) BI-RADS CATEGORY  1: Negative. Electronically Signed   By: 12/06/2017 M.D.   On: 12/04/2017 12:15     Assessment & Plan:  Plan  I have discontinued Glenda Lyons. Glenda Lyons's losartan-hydrochlorothiazide. I am also having her start on losartan-hydrochlorothiazide. Additionally, I am having her maintain her fluticasone, aspirin, vitamin C, Calcium-Vitamin D, Flax Seed Oil, vitamin A, meloxicam, sertraline, and simvastatin.  Meds ordered this encounter  Medications  . losartan-hydrochlorothiazide (HYZAAR) 100-25 MG tablet    Sig: Take 1 tablet by mouth daily.    Dispense:  90 tablet    Refill:  3    Problem List Items Addressed This Visit      Unprioritized   Essential hypertension - Primary    Poorly controlled will alter medications, encouraged DASH diet, minimize caffeine and obtain adequate sleep. Report concerning symptoms and follow up as directed and as needed      Relevant  Medications   losartan-hydrochlorothiazide (HYZAAR) 100-25 MG tablet   Other Relevant Orders   ECHOCARDIOGRAM COMPLETE   EKG 12-Lead (Completed)   CBC with Differential (Completed)   Lipid panel (Completed)   Comprehensive metabolic panel (Completed)   TSH (Completed)   Ambulatory referral to Cardiology   New onset left bundle branch block (LBBB)    D/w cardiology Refer to cards Adjust bp meds  Check echo  F/u 2-3 weeks if unable to get into cards       Relevant Medications   losartan-hydrochlorothiazide (HYZAAR) 100-25 MG tablet   Other Relevant Orders   Ambulatory referral to Cardiology   Palpitation   Relevant Orders   EKG 12-Lead (Completed)   CBC with Differential (Completed)  Lipid panel (Completed)   Comprehensive metabolic panel (Completed)   TSH (Completed)   POCT urinalysis dipstick (Completed)    Other Visit Diagnoses    Abnormal urine finding       Relevant Orders   Urine Culture      Follow-up: Return in about 3 weeks (around 02/15/2019), or if symptoms worsen or fail to improve, for hypertension.  [name], DO

## 2019-01-25 NOTE — Assessment & Plan Note (Signed)
Poorly controlled will alter medications, encouraged DASH diet, minimize caffeine and obtain adequate sleep. Report concerning symptoms and follow up as directed and as needed 

## 2019-01-25 NOTE — Telephone Encounter (Addendum)
Pt called stating her BP has been elevated; her readings today were 191/98 at 0530; 168/100 at 0845;it was also elevated on 01/24/2019;the readings were taken on his left and right wrists; she denies chest pain, numbness, or difficulty breathing; recommendations made per nurse triage protocol; she verbalized understanding; the pt sees Dr Etter Sjogren, [facility]; pt transferred to [facility] for scheduling

## 2019-01-27 ENCOUNTER — Other Ambulatory Visit: Payer: Self-pay | Admitting: Family Medicine

## 2019-01-27 DIAGNOSIS — F320 Major depressive disorder, single episode, mild: Secondary | ICD-10-CM

## 2019-01-27 LAB — URINE CULTURE
MICRO NUMBER:: 1199295
SPECIMEN QUALITY:: ADEQUATE

## 2019-01-27 NOTE — Telephone Encounter (Signed)
Last OV 01/25/19 Last refill 12/17/18 #45/0 Next OV not scheduled

## 2019-01-28 ENCOUNTER — Telehealth: Payer: Self-pay | Admitting: Family Medicine

## 2019-01-28 DIAGNOSIS — E785 Hyperlipidemia, unspecified: Secondary | ICD-10-CM

## 2019-01-28 MED ORDER — SIMVASTATIN 40 MG PO TABS: ORAL_TABLET | ORAL | 2 refills | Status: DC

## 2019-01-28 NOTE — Telephone Encounter (Signed)
Pt states seeing Mychart message.

## 2019-01-28 NOTE — Telephone Encounter (Signed)
Copied from [place] [id number]. Topic: General - Other >> Jan 28, 2019  2:34 PM Antonieta Iba C wrote: Reason for CRM: pt says that she is returning [place] call. Pt says that she seen her results are mychart   Please advise.

## 2019-02-01 ENCOUNTER — Other Ambulatory Visit: Payer: Self-pay

## 2019-02-01 ENCOUNTER — Ambulatory Visit (HOSPITAL_BASED_OUTPATIENT_CLINIC_OR_DEPARTMENT_OTHER)
Admission: RE | Admit: 2019-02-01 | Discharge: 2019-02-01 | Disposition: A | Discharge status: Home / Self Care | Payer: Medicare Other | Source: Ambulatory Visit | Attending: Family Medicine | Admitting: Family Medicine

## 2019-02-01 DIAGNOSIS — I10 Essential (primary) hypertension: Secondary | ICD-10-CM | POA: Diagnosis present

## 2019-02-01 NOTE — Progress Notes (Signed)
  Echocardiogram 2D Echocardiogram has been performed.  Glenda Lyons 02/01/2019, 1:42 PM

## 2019-02-14 ENCOUNTER — Ambulatory Visit (INDEPENDENT_AMBULATORY_CARE_PROVIDER_SITE_OTHER): Payer: Medicare Other | Admitting: Cardiology

## 2019-02-14 ENCOUNTER — Other Ambulatory Visit: Payer: Self-pay

## 2019-02-14 ENCOUNTER — Encounter: Payer: Self-pay | Admitting: Cardiology

## 2019-02-14 ENCOUNTER — Encounter: Payer: Self-pay | Admitting: *Deleted

## 2019-02-14 VITALS — BP 142/82 | HR 63 | Ht 66.0 in | Wt 155.0 lb

## 2019-02-14 DIAGNOSIS — I447 Left bundle-branch block, unspecified: Secondary | ICD-10-CM

## 2019-02-14 DIAGNOSIS — E785 Hyperlipidemia, unspecified: Secondary | ICD-10-CM

## 2019-02-14 DIAGNOSIS — R9431 Abnormal electrocardiogram [ECG] [EKG]: Secondary | ICD-10-CM | POA: Diagnosis not present

## 2019-02-14 DIAGNOSIS — I10 Essential (primary) hypertension: Secondary | ICD-10-CM | POA: Diagnosis not present

## 2019-02-14 NOTE — Patient Instructions (Signed)
Medication Instructions:  Your physician recommends that you continue on your current medications as directed. Please refer to the Current Medication list given to you today.  *If you need a refill on your cardiac medications before your next appointment, please call your pharmacy*  Lab Work: None If you have labs (blood work) drawn today and your tests are completely normal, you will receive your results only by: . MyChart Message (if you have MyChart) OR . A paper copy in the mail If you have any lab test that is abnormal or we need to change your treatment, we will call you to review the results.  Testing/Procedures: Your physician has requested that you have a lexiscan myoview. For further information please visit [email or web address]. Please follow instruction sheet, as given.    Follow-Up: At [facility], you and your health needs are our priority.  As part of our continuing mission to provide you with exceptional heart care, we have created designated Provider Care Teams.  These Care Teams include your primary Cardiologist (physician) and Advanced Practice Providers (APPs -  Physician Assistants and Nurse Practitioners) who all work together to provide you with the care you need, when you need it.  Your next appointment:   1 month(s)  The format for your next appointment:   In Person  Provider:   Kardie Tobb, DO  Other Instructions  Cardiac Nuclear Scan A cardiac nuclear scan is a test that is done to check the flow of blood to your heart. It is done when you are resting and when you are exercising. The test looks for problems such as:  Not enough blood reaching a portion of the heart.  The heart muscle not working as it should. You may need this test if:  You have heart disease.  You have had lab results that are not normal.  You have had heart surgery or a balloon procedure to open up blocked arteries (angioplasty).  You have chest pain.  You have shortness  of breath. In this test, a special dye (tracer) is put into your bloodstream. The tracer will travel to your heart. A camera will then take pictures of your heart to see how the tracer moves through your heart. This test is usually done at a hospital and takes 2-4 hours. Tell a doctor about:  Any allergies you have.  All medicines you are taking, including vitamins, herbs, eye drops, creams, and over-the-counter medicines.  Any problems you or family members have had with anesthetic medicines.  Any blood disorders you have.  Any surgeries you have had.  Any medical conditions you have.  Whether you are pregnant or may be pregnant. What are the risks? Generally, this is a safe test. However, problems may occur, such as:  Serious chest pain and heart attack. This is only a risk if the stress portion of the test is done.  Rapid heartbeat.  A feeling of warmth in your chest. This feeling usually does not last long.  Allergic reaction to the tracer. What happens before the test?  Ask your doctor about changing or stopping your normal medicines. This is important.  Follow instructions from your doctor about what you cannot eat or drink.  Remove your jewelry on the day of the test. What happens during the test?  An IV tube will be inserted into one of your veins.  Your doctor will give you a small amount of tracer through the IV tube.  You will wait for 20-40 minutes while the   tracer moves through your bloodstream.  Your heart will be monitored with an electrocardiogram (ECG).  You will lie down on an exam table.  Pictures of your heart will be taken for about 15-20 minutes.  You may also have a stress test. For this test, one of these things may be done: ? You will be asked to exercise on a treadmill or a stationary bike. ? You will be given medicines that will make your heart work harder. This is done if you are unable to exercise.  When blood flow to your heart has  peaked, a tracer will again be given through the IV tube.  After 20-40 minutes, you will get back on the exam table. More pictures will be taken of your heart.  Depending on the tracer that is used, more pictures may need to be taken 3-4 hours later.  Your IV tube will be removed when the test is over. The test may vary among doctors and hospitals. What happens after the test?  Ask your doctor: ? Whether you can return to your normal schedule, including diet, activities, and medicines. ? Whether you should drink more fluids. This will help to remove the tracer from your body. Drink enough fluid to keep your pee (urine) pale yellow.  Ask your doctor, or the department that is doing the test: ? When will my results be ready? ? How will I get my results? Summary  A cardiac nuclear scan is a test that is done to check the flow of blood to your heart.  Tell your doctor whether you are pregnant or may be pregnant.  Before the test, ask your doctor about changing or stopping your normal medicines. This is important.  Ask your doctor whether you can return to your normal activities. You may be asked to drink more fluids. This information is not intended to replace advice given to you by your health care provider. Make sure you discuss any questions you have with your health care provider. Document Revised: 05/19/2018 Document Reviewed: 07/13/2017 Elsevier Patient Education  2020 [employer]   

## 2019-02-14 NOTE — Progress Notes (Signed)
Cardiology Office Note:    Date:  02/14/2019   ID:  Glenda Lyons, DOB 11/06/1945, MRN [id number]  PCP:  Donato Schultz, DO  Cardiologist:  No primary care provider on file.  Electrophysiologist:  None   Referring MD: Zola Button, Grayling Congress, *   The patient was referred for hypertension and abnormal ekg.  History of Present Illness:    Glenda Lyons is a 74 y.o. female with a hx of hypertension, hyperlipidemia is referred by her PCP for abnormal EKG.  The patient tells me that she did see her PCP on January 25, 2019 due to the fact that she was experiencing elevated blood pressure and more importantly she can feel her pulse in her ears.  During her visit she was noted to have elevated blood pressure as well as a new onset left bundle branch block.  She denied any chest pain, shortness of breath, nausea, vomiting.  For her elevated blood pressure she was started on losartan 100 mg daily and hydrochlorothiazide 25 mg daily.  Due to the abnormal EKG she was referred to cardiology.  In the interim she has been taking her new blood pressure medications and also has been monitoring her blood pressure which he shared the readings with me today at her visit.  She denies any chest pain today, shortness of breath, lightheadedness, palpitations or dizziness.  Past Medical History:  Diagnosis Date  . Allergy    seasonal  . Depression   . Herniated disc   . Hyperlipemia   . Hypertension   . Neuromuscular disorder (HCC)   . Osteoarthritis   . Sciatica     Past Surgical History:  Procedure Laterality Date  . BIOPSY BREAST    . BREAST BIOPSY Right   . CYSTECTOMY     leg  . DILATION AND CURETTAGE OF UTERUS  09/11/2010   Procedure: DILATATION AND CURETTAGE (D&C);  Surgeon: Michael Litter, MD;  Location: [facility];  Service: Gynecology;  Laterality: N/A;  . ENDOMETRIAL BIOPSY    . HYSTEROSCOPY WITH RESECTOSCOPE  09/11/2010   Procedure: HYSTEROSCOPY WITH RESECTOSCOPE;  Surgeon: Michael Litter, MD;  Location: [facility];  Service: Gynecology;  Laterality: N/A;  . LEEP  1994  . TONSILLECTOMY      Current Medications: Current Meds  Medication Sig  . aspirin 81 MG tablet Take 81 mg by mouth daily.  . Calcium Carb-Cholecalciferol (CALCIUM-VITAMIN D) 500-400 MG-UNIT TABS Take by mouth daily.  . Flaxseed, Linseed, (FLAX SEED OIL) 1000 MG CAPS Take by mouth daily.  . fluticasone (FLONASE) 50 MCG/ACT nasal spray instill 2 sprays into each nostril at bedtime  . losartan-hydrochlorothiazide (HYZAAR) 100-25 MG tablet Take 1 tablet by mouth daily.  . meloxicam (MOBIC) 15 MG tablet TAKE 1/2 TO 1 TABLET BY MOUTH EVERY DAY IF NEEDED  . sertraline (ZOLOFT) 100 MG tablet TABLET 1&1/2 TABLET BY MOUTH EVERY DAY. Needs ov before any more refills.  . simvastatin (ZOCOR) 40 MG tablet TAKE 1 TABLET(40 MG) BY MOUTH AT BEDTIME  . vitamin A 8000 UNIT capsule Take 8,000 Units by mouth daily.  . vitamin C (ASCORBIC ACID) 250 MG tablet Take 250 mg by mouth daily.     Allergies:   Almond oil and Other   Social History   Socioeconomic History  . Marital status: Married    Spouse name: Not on file  . Number of children: Not on file  . Years of education: Not on file  . Highest  education level: Not on file  Occupational History  . Not on file  Tobacco Use  . Smoking status: Never Smoker  . Smokeless tobacco: Never Used  Substance and Sexual Activity  . Alcohol use: Yes    Comment: occasionally  . Drug use: No  . Sexual activity: Yes    Partners: Male  Other Topics Concern  . Not on file  Social History Narrative   Exercise-- just walking up and down the stairs at home   Social Determinants of Health   Financial Resource Strain:   . Difficulty of Paying Living Expenses: Not on file  Food Insecurity:   . Worried About Programme researcher, broadcasting/film/video in the Last Year: Not on file  . Ran Out of Food in the Last Year: Not on file  Transportation Needs:   . Lack of Transportation (Medical): Not on  file  . Lack of Transportation (Non-Medical): Not on file  Physical Activity:   . Days of Exercise per Week: Not on file  . Minutes of Exercise per Session: Not on file  Stress:   . Feeling of Stress : Not on file  Social Connections:   . Frequency of Communication with Friends and Family: Not on file  . Frequency of Social Gatherings with Friends and Family: Not on file  . Attends Religious Services: Not on file  . Active Member of Clubs or Organizations: Not on file  . Attends Banker Meetings: Not on file  . Marital Status: Not on file     Family History: The patient's family history includes Breast cancer in her mother; Heart attack in her unknown relative; Lymphoma in her unknown relative. There is no history of Colon cancer.  ROS:   Review of Systems  Constitution: Negative for decreased appetite, fever and weight gain.  HENT: Negative for congestion, ear discharge, hoarse voice and sore throat.   Eyes: Negative for discharge, redness, vision loss in right eye and visual halos.  Cardiovascular: Negative for chest pain, dyspnea on exertion, leg swelling, orthopnea and palpitations.  Respiratory: Negative for cough, hemoptysis, shortness of breath and snoring.   Endocrine: Negative for heat intolerance and polyphagia.  Hematologic/Lymphatic: Negative for bleeding problem. Does not bruise/bleed easily.  Skin: Negative for flushing, nail changes, rash and suspicious lesions.  Musculoskeletal: Negative for arthritis, joint pain, muscle cramps, myalgias, neck pain and stiffness.  Gastrointestinal: Negative for abdominal pain, bowel incontinence, diarrhea and excessive appetite.  Genitourinary: Negative for decreased libido, genital sores and incomplete emptying.  Neurological: Negative for brief paralysis, focal weakness, headaches and loss of balance.  Psychiatric/Behavioral: Negative for altered mental status, depression and suicidal ideas.  Allergic/Immunologic:  Negative for HIV exposure and persistent infections.    EKGs/Labs/Other Studies Reviewed:    The following studies were reviewed today:   EKG:  The ekg ordered today demonstrates sinus rhythm, heart rate 61 bpm with poor R wave progression which would be suggestive of old anterior wall infarction.  Compared to the ECG done on 01/25/2019 which shows sinus rhythm with left bundle branch block-which are new compared to ECG done in August 2012.  Thoracic echocardiogram 02/01/2019 IMPRESSIONS  1. Left ventricular ejection fraction, by visual estimation, is 60 to 65%. The left ventricle has normal function. There is no left ventricular hypertrophy.  2. The left ventricle has no regional wall motion abnormalities.  3. 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  FINDINGS  Left Ventricle: Left ventricular ejection fraction, by visual  estimation, is 60 to 65%. The left ventricle has normal function. The left ventricle has no regional wall motion abnormalities. There is no left ventricular hypertrophy. Normal left atrial  pressure.  Right Ventricle: The right ventricular size is normal. No increase in right ventricular wall thickness. Global RV systolic function is has normal systolic function. The tricuspid regurgitant velocity is 2.13 m/s, and with an assumed right atrial pressure  of 3 mmHg, the estimated right ventricular systolic pressure is normal at 21.1 mmHg.  Left Atrium: Left atrial size was normal in size.  Right Atrium: Right atrial size was normal in size  Pericardium: There is no evidence of pericardial effusion.  Mitral Valve: The mitral valve is normal in structure. No evidence of mitral valve regurgitation. No evidence of mitral valve stenosis by observation.  Tricuspid Valve: The tricuspid valve is normal in structure. Tricuspid valve regurgitation is mild.  Aortic Valve: The aortic valve is normal in structure. Aortic valve regurgitation is  not visualized. The aortic valve is structurally normal, with no evidence of sclerosis or stenosis.  Pulmonic Valve: The pulmonic valve was normal in structure. Pulmonic valve regurgitation is not visualized. Pulmonic regurgitation is not visualized.  Aorta: The aortic root, ascending aorta and aortic arch are all structurally normal, with no evidence of dilitation or obstruction. 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  Venous: The inferior vena cava is normal in size with greater than 50% respiratory variability, suggesting right atrial pressure of 3 mmHg.  IAS/Shunts: No atrial level shunt detected by color flow Doppler. There is no evidence of a patent foramen ovale. No ventricular septal defect is seen or detected. There is no evidence of an atrial septal defect.   Recent Labs: 01/25/2019: ALT 20; BUN 20; Creatinine, Ser 0.76; Hemoglobin 15.4; Platelets 250.0; Potassium 4.9; Sodium 139; TSH 3.50  Recent Lipid Panel    Component Value Date/Time   CHOL 231 (H) 01/25/2019 1416   TRIG 115.0 01/25/2019 1416   HDL 69.00 01/25/2019 1416   CHOLHDL 3 01/25/2019 1416   VLDL 23.0 01/25/2019 1416   LDLCALC 139 (H) 01/25/2019 1416    Physical Exam:    VS:  BP (!) 142/82 (BP Location: Right Arm, Patient Position: Sitting, Cuff Size: Normal)   Pulse 63   Ht 5\' 6"  (1.676 m)   Wt 155 lb (70.3 kg)   SpO2 97%   BMI 25.02 kg/m     Wt Readings from Last 3 Encounters:  02/14/19 155 lb (70.3 kg)  01/25/19 155 lb 6.4 oz (70.5 kg)  08/04/17 162 lb 6.4 oz (73.7 kg)     GEN: Well nourished, well developed in no acute distress HEENT: Normal NECK: No JVD; No carotid bruits LYMPHATICS: No lymphadenopathy CARDIAC: S1S2 noted,RRR, no murmurs, rubs, gallops RESPIRATORY:  Clear to auscultation without rales, wheezing or rhonchi  ABDOMEN: Soft, non-tender, non-distended, +bowel sounds, no guarding. EXTREMITIES: No edema, No cyanosis, no clubbing MUSCULOSKELETAL:   No edema; No deformity  SKIN: Warm and dry NEUROLOGIC:  Alert and oriented x 3, non-focal PSYCHIATRIC:  Normal affect, good insight  ASSESSMENT:    1. Essential hypertension   2. Hyperlipidemia, unspecified hyperlipidemia type   3. Left bundle branch block    PLAN:    1.  Her EKG today is still normal with concern for old anterior infarction, no left bundle branch block today.  Given her risk factors and her abnormal EKG I will pursue a pharmacologic nuclear stress test in this patient  to assess for coronary artery disease.  2.  Her blood pressure has improved on her losartan 100 mg daily as well as hydrochlorothiazide 25 mg daily.  She shared readings with me from home which are running between 114 mmHg to 379 mmHg systolic and with diastolics in the 02I to 09B.\  I independently reviewed her echocardiogram which showed evidence of mild concentric left ventricular hypertrophy, grade 1 diastolic dysfunction with already reported proximal ascending aorta does mildly dilated at 37 millimeters.  3.  Hyperlipidemia continue patient on her simvastatin.  The patient is in agreement with the above plan. The patient left the office in stable condition.  The patient will follow up in 1 month for blood pressure follow-up   Medication Adjustments/Labs and Tests Ordered: Current medicines are reviewed at length with the patient today.  Concerns regarding medicines are outlined above.  No orders of the defined types were placed in this encounter.  No orders of the defined types were placed in this encounter.   There are no Patient Instructions on file for this visit.   Adopting a Healthy Lifestyle.  Know what a healthy weight is for you (roughly BMI <25) and aim to maintain this   Aim for 7+ servings of fruits and vegetables daily   65-80+ fluid ounces of water or unsweet tea for healthy kidneys   Limit to max 1 drink of alcohol per day; avoid smoking/tobacco   Limit animal fats in diet  for cholesterol and heart health - choose grass fed whenever available   Avoid highly processed foods, and foods high in saturated/trans fats   Aim for low stress - take time to unwind and care for your mental health   Aim for 150 min of moderate intensity exercise weekly for heart health, and weights twice weekly for bone health   Aim for 7-9 hours of sleep daily   When it comes to diets, agreement about the perfect plan isnt easy to find, even among the experts. Experts at the [employer] developed an idea known as the Healthy Eating Plate. Just imagine a plate divided into logical, healthy portions.   The emphasis is on diet quality:   Load up on vegetables and fruits - one-half of your plate: Aim for color and variety, and remember that potatoes dont count.   Go for whole grains - one-quarter of your plate: Whole wheat, barley, wheat berries, quinoa, oats, brown rice, and foods made with them. If you want pasta, go with whole wheat pasta.   Protein power - one-quarter of your plate: Fish, chicken, beans, and nuts are all healthy, versatile protein sources. Limit red meat.   The diet, however, does go beyond the plate, offering a few other suggestions.   Use healthy plant oils, such as olive, canola, soy, corn, sunflower and peanut. Check the labels, and avoid partially hydrogenated oil, which have unhealthy trans fats.   If youre thirsty, drink water. Coffee and tea are good in moderation, but skip sugary drinks and limit milk and dairy products to one or two daily servings.   The type of carbohydrate in the diet is more important than the amount. Some sources of carbohydrates, such as vegetables, fruits, whole grains, and beans-are healthier than others.   Finally, stay active  Signed, Berniece Salines, DO  02/14/2019 1:51 PM    [facility]

## 2019-02-17 ENCOUNTER — Encounter (HOSPITAL_COMMUNITY): Payer: Medicare Other

## 2019-02-23 ENCOUNTER — Telehealth (HOSPITAL_COMMUNITY): Payer: Self-pay

## 2019-02-23 NOTE — Telephone Encounter (Signed)
Instructions left on the patient's answering machine. Asked to call back with any questions. S.Raschelle Wisenbaker EMTP 

## 2019-02-24 ENCOUNTER — Other Ambulatory Visit: Payer: Self-pay

## 2019-02-24 ENCOUNTER — Ambulatory Visit (HOSPITAL_COMMUNITY): Payer: Medicare Other | Attending: Cardiology

## 2019-02-24 VITALS — Ht 66.0 in | Wt 155.0 lb

## 2019-02-24 DIAGNOSIS — R9431 Abnormal electrocardiogram [ECG] [EKG]: Secondary | ICD-10-CM | POA: Diagnosis present

## 2019-02-24 LAB — MYOCARDIAL PERFUSION IMAGING
LV dias vol: 65 mL (ref 46–106)
LV sys vol: 17 mL
Peak HR: 95 {beats}/min
Rest HR: 65 {beats}/min
SDS: 0
SRS: 0
SSS: 0
TID: 1.08

## 2019-02-24 MED ORDER — REGADENOSON 0.4 MG/5ML IV SOLN
0.4000 mg | Freq: Once | INTRAVENOUS | Status: AC
  Administered 2019-02-24: 0.4 mg via INTRAVENOUS

## 2019-02-24 MED ORDER — TECHNETIUM TC 99M TETROFOSMIN IV KIT
30.8000 | PACK | Freq: Once | INTRAVENOUS | Status: AC | PRN
  Administered 2019-02-24: 30.8 via INTRAVENOUS
  Filled 2019-02-24: qty 31

## 2019-02-24 MED ORDER — TECHNETIUM TC 99M TETROFOSMIN IV KIT
10.7000 | PACK | Freq: Once | INTRAVENOUS | Status: AC | PRN
  Administered 2019-02-24: 10.7 via INTRAVENOUS
  Filled 2019-02-24: qty 11

## 2019-02-28 ENCOUNTER — Telehealth: Payer: Self-pay | Admitting: *Deleted

## 2019-02-28 NOTE — Telephone Encounter (Signed)
-----   Message from Thomasene Ripple, DO sent at 02/24/2019 10:49 PM EST ----- Normal stress test

## 2019-02-28 NOTE — Telephone Encounter (Signed)
Telephone call to patient. Left message that stress test was  Normal and to call with any questions.

## 2019-03-15 ENCOUNTER — Ambulatory Visit (INDEPENDENT_AMBULATORY_CARE_PROVIDER_SITE_OTHER): Payer: Medicare Other | Admitting: Cardiology

## 2019-03-15 ENCOUNTER — Encounter: Payer: Self-pay | Admitting: Cardiology

## 2019-03-15 ENCOUNTER — Other Ambulatory Visit: Payer: Self-pay | Admitting: Family Medicine

## 2019-03-15 ENCOUNTER — Other Ambulatory Visit: Payer: Self-pay

## 2019-03-15 VITALS — BP 144/90 | HR 68 | Ht 72.0 in | Wt 152.0 lb

## 2019-03-15 DIAGNOSIS — E782 Mixed hyperlipidemia: Secondary | ICD-10-CM

## 2019-03-15 DIAGNOSIS — I10 Essential (primary) hypertension: Secondary | ICD-10-CM | POA: Diagnosis not present

## 2019-03-15 DIAGNOSIS — I447 Left bundle-branch block, unspecified: Secondary | ICD-10-CM

## 2019-03-15 DIAGNOSIS — M5440 Lumbago with sciatica, unspecified side: Secondary | ICD-10-CM

## 2019-03-15 NOTE — Progress Notes (Signed)
Cardiology Office Note:    Date:  03/15/2019   ID:  Glenda ButtMartha M Mentzer, DOB [birth year], MRN [id number]  PCP:  Zola ButtonLowne Chase, Grayling CongressYvonne R, DO  Cardiologist:  Thomasene RippleKardie Corbitt Cloke, DO  Electrophysiologist:  None   Referring MD: Zola ButtonLowne Chase, Grayling CongressYvonne R, *   The patient presents for a follow up visit.  History of Present Illness:    Glenda Lyons is a 74 y.o. female with a hx of hypertension, hyperlipidemia who was initially referred for abnormal EKG.  I did see the patient on February 14, 2019, her abnormal EKG was due to bundle branch block since this was new I recommended the patient undergo pharmacologic nuclear stress test to assess for coronary artery disease.  In addition she was hypertensive at her visit but this was an improvement after she was started on losartan 100 mg daily and hydrochlorothiazide 25 mg daily.  She did tell me that her blood pressure readings at home her systolics was between 1 14-1 30 and diastolics between 70 to 80s therefore I did not add any additional antihypertensive I requested the patient bring her home recordings in 4 weeks for follow-up visit.  In the interim she was able to get her stress test which was reported as normal-low risk study.  There was evidence of rate related bundle branch block during the study.  She is here today for follow-up visit. Past Medical History:  Diagnosis Date  . Allergy    seasonal  . Depression   . Herniated disc   . Hyperlipemia   . Hypertension   . Neuromuscular disorder (HCC)   . Osteoarthritis   . Sciatica     Past Surgical History:  Procedure Laterality Date  . BIOPSY BREAST    . BREAST BIOPSY Right   . CYSTECTOMY     leg  . DILATION AND CURETTAGE OF UTERUS  09/11/2010   Procedure: DILATATION AND CURETTAGE (D&C);  Surgeon: Michael LitterNaima A Dillard, MD;  Location: [facility];  Service: Gynecology;  Laterality: N/A;  . ENDOMETRIAL BIOPSY    . HYSTEROSCOPY WITH RESECTOSCOPE  09/11/2010   Procedure: HYSTEROSCOPY WITH RESECTOSCOPE;  Surgeon:  Michael LitterNaima A Dillard, MD;  Location: [facility];  Service: Gynecology;  Laterality: N/A;  . LEEP  1994  . TONSILLECTOMY      Current Medications: Current Meds  Medication Sig  . aspirin 81 MG tablet Take 81 mg by mouth daily.  . Calcium Carb-Cholecalciferol (CALCIUM-VITAMIN D) 500-400 MG-UNIT TABS Take by mouth daily.  . Flaxseed, Linseed, (FLAX SEED OIL) 1000 MG CAPS Take by mouth daily.  . fluticasone (FLONASE) 50 MCG/ACT nasal spray instill 2 sprays into each nostril at bedtime  . losartan-hydrochlorothiazide (HYZAAR) 100-25 MG tablet Take 1 tablet by mouth daily.  . sertraline (ZOLOFT) 100 MG tablet TABLET 1&1/2 TABLET BY MOUTH EVERY DAY. Needs ov before any more refills.  . simvastatin (ZOCOR) 40 MG tablet TAKE 1 TABLET(40 MG) BY MOUTH AT BEDTIME  . VITAMIN A PO Take 3,000 mg by mouth daily.  . vitamin C (ASCORBIC ACID) 250 MG tablet Take 250 mg by mouth daily.  . [DISCONTINUED] meloxicam (MOBIC) 15 MG tablet TAKE 1/2 TO 1 TABLET BY MOUTH EVERY DAY IF NEEDED  . [DISCONTINUED] vitamin A 8000 UNIT capsule Take 3,000 Units by mouth daily.      Allergies:   Almond oil and Other   Social History   Socioeconomic History  . Marital status: Married    Spouse name: Not on file  . Number of  children: Not on file  . Years of education: Not on file  . Highest education level: Not on file  Occupational History  . Not on file  Tobacco Use  . Smoking status: Never Smoker  . Smokeless tobacco: Never Used  Substance and Sexual Activity  . Alcohol use: Yes    Comment: occasionally  . Drug use: No  . Sexual activity: Yes    Partners: Male  Other Topics Concern  . Not on file  Social History Narrative   Exercise-- just walking up and down the stairs at home   Social Determinants of Health   Financial Resource Strain:   . Difficulty of Paying Living Expenses: Not on file  Food Insecurity:   . Worried About Programme researcher, broadcasting/film/videounning Out of Food in the Last Year: Not on file  . Ran Out of Food in the Last  Year: Not on file  Transportation Needs:   . Lack of Transportation (Medical): Not on file  . Lack of Transportation (Non-Medical): Not on file  Physical Activity:   . Days of Exercise per Week: Not on file  . Minutes of Exercise per Session: Not on file  Stress:   . Feeling of Stress : Not on file  Social Connections:   . Frequency of Communication with Friends and Family: Not on file  . Frequency of Social Gatherings with Friends and Family: Not on file  . Attends Religious Services: Not on file  . Active Member of Clubs or Organizations: Not on file  . Attends BankerClub or Organization Meetings: Not on file  . Marital Status: Not on file     Family History: The patient's family history includes Breast cancer in her mother; Heart attack in her unknown relative; Lymphoma in her unknown relative. There is no history of Colon cancer.  ROS:   Review of Systems  Constitution: Negative for decreased appetite, fever and weight gain.  HENT: Negative for congestion, ear discharge, hoarse voice and sore throat.   Eyes: Negative for discharge, redness, vision loss in right eye and visual halos.  Cardiovascular: Negative for chest pain, dyspnea on exertion, leg swelling, orthopnea and palpitations.  Respiratory: Negative for cough, hemoptysis, shortness of breath and snoring.   Endocrine: Negative for heat intolerance and polyphagia.  Hematologic/Lymphatic: Negative for bleeding problem. Does not bruise/bleed easily.  Skin: Negative for flushing, nail changes, rash and suspicious lesions.  Musculoskeletal: Negative for arthritis, joint pain, muscle cramps, myalgias, neck pain and stiffness.  Gastrointestinal: Negative for abdominal pain, bowel incontinence, diarrhea and excessive appetite.  Genitourinary: Negative for decreased libido, genital sores and incomplete emptying.  Neurological: Negative for brief paralysis, focal weakness, headaches and loss of balance.  Psychiatric/Behavioral:  Negative for altered mental status, depression and suicidal ideas.  Allergic/Immunologic: Negative for HIV exposure and persistent infections.    EKGs/Labs/Other Studies Reviewed:    The following studies were reviewed today:   EKG:  The ekg ordered today demonstrates   Pharmacologic nuclear stress test:  The left ventricular ejection fraction is hyperdynamic (>65%).  Nuclear stress EF: 73%.  There was no ST segment deviation noted during stress.  No T wave inversion was noted during stress.  The study is normal.  This is a low risk study.   1. Normal myocardial perfusion imaging study without evidence of ischemia or infarction.  2. Patient developed a rate-related LBBB that resolved (developed ~90 bpm).  3. Normal LVEF, >65%.  4. Low-risk study.    Transthoracic echocardiogram 02/01/2019 IMPRESSIONS  1. Left ventricular ejection fraction, by visual estimation, is 60 to  65%. The left ventricle has normal function. There is no left ventricular  hypertrophy.  2. The left ventricle has no regional wall motion abnormalities.  3. 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   FINDINGS  Left Ventricle: Left ventricular ejection fraction, by visual estimation,  is 60 to 65%. The left ventricle has normal function. The left ventricle  has no regional wall motion abnormalities. There is no left ventricular  hypertrophy. Normal left atrial  pressure.   Right Ventricle: The right ventricular size is normal. No increase in  right ventricular wall thickness. Global RV systolic function is has  normal systolic function. The tricuspid regurgitant velocity is 2.13 m/s,  and with an assumed right atrial pressure  of 3 mmHg, the estimated right ventricular systolic pressure is normal at  21.1 mmHg.   Left Atrium: Left atrial size was normal in size.   Right Atrium: Right atrial size was normal in size   Pericardium: There is no evidence of  pericardial effusion.   Mitral Valve: The mitral valve is normal in structure. No evidence of  mitral valve regurgitation. No evidence of mitral valve stenosis by  observation.   Tricuspid Valve: The tricuspid valve is normal in structure. Tricuspid  valve regurgitation is mild.   Aortic Valve: The aortic valve is normal in structure. Aortic valve  regurgitation is not visualized. The aortic valve is structurally normal,  with no evidence of sclerosis or stenosis.   Pulmonic Valve: The pulmonic valve was normal in structure. Pulmonic valve  regurgitation is not visualized. Pulmonic regurgitation is not visualized.   Aorta: The aortic root, ascending aorta and aortic arch are all  structurally normal, with no evidence of dilitation or obstruction. 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   Venous: The inferior vena cava is normal in size with greater than 50%  respiratory variability, suggesting right atrial pressure of 3 mmHg.   IAS/Shunts: No atrial level shunt detected by color flow Doppler. There is  no evidence of a patent foramen ovale. No ventricular septal defect is  seen or detected. There is no evidence of an atrial septal defect.    Recent Labs: 01/25/2019: ALT 20; BUN 20; Creatinine, Ser 0.76; Hemoglobin 15.4; Platelets 250.0; Potassium 4.9; Sodium 139; TSH 3.50  Recent Lipid Panel    Component Value Date/Time   CHOL 231 (H) 01/25/2019 1416   TRIG 115.0 01/25/2019 1416   HDL 69.00 01/25/2019 1416   CHOLHDL 3 01/25/2019 1416   VLDL 23.0 01/25/2019 1416   LDLCALC 139 (H) 01/25/2019 1416    Physical Exam:    VS:  BP (!) 144/90 (BP Location: Left Arm, Patient Position: Sitting, Cuff Size: Normal)   Pulse 68   Ht 6' (1.829 m)   Wt 152 lb (68.9 kg)   SpO2 99%   BMI 20.61 kg/m     Wt Readings from Last 3 Encounters:  03/15/19 152 lb (68.9 kg)  02/24/19 155 lb (70.3 kg)  02/14/19 155 lb (70.3 kg)     GEN: Well nourished, well  developed in no acute distress HEENT: Normal NECK: No JVD; No carotid bruits LYMPHATICS: No lymphadenopathy CARDIAC: S1S2 noted,RRR, no murmurs, rubs, gallops RESPIRATORY:  Clear to auscultation without rales, wheezing or rhonchi  ABDOMEN: Soft, non-tender, non-distended, +bowel sounds, no guarding. EXTREMITIES: No edema, No cyanosis, no clubbing MUSCULOSKELETAL:  No deformity  SKIN: Warm  and dry NEUROLOGIC:  Alert and oriented x 3, non-focal PSYCHIATRIC:  Normal affect, good insight  ASSESSMENT:    1. Essential hypertension   2. LBBB (left bundle branch block)    PLAN:    Her blood pressure is slightly elevated today however the patient tells me it has been mostly in the 120s at home which she did bring her blood pressure records with me and I was able to review this and this is actually between 120s and 130s at home.  For now we will keep her on current medication dose.  Also again able to talk to her about her stress test and answer all of her questions times related to this.  She is planning on seeing orthopedics and she anticipates that she may need a back or hip surgery.  For now we will take her blood pressure at home daily and record the readings again.  I congratulated the patient today her visit as she did lost 2 pounds since her last visit.  Hyperlipidemia continue patient on her current simvastatin 40 mg daily.  The patient is in agreement with the above plan. The patient left the office in stable condition.  The patient will follow up in   Medication Adjustments/Labs and Tests Ordered: Current medicines are reviewed at length with the patient today.  Concerns regarding medicines are outlined above.  No orders of the defined types were placed in this encounter.  No orders of the defined types were placed in this encounter.   Patient Instructions   Medication Instructions:  Your physician recommends that you continue on your current medications as directed. Please  refer to the Current Medication list given to you today.  *If you need a refill on your cardiac medications before your next appointment, please call your pharmacy*  Lab Work: None  If you have labs (blood work) drawn today and your tests are completely normal, you will receive your results only by: [name] MyChart Message (if you have MyChart) OR . A paper copy in the mail If you have any lab test that is abnormal or we need to change your treatment, we will call you to review the results.  Testing/Procedures: NOne  Follow-Up: At [facility], you and your health needs are our priority.  As part of our continuing mission to provide you with exceptional heart care, we have created designated Provider Care Teams.  These Care Teams include your primary Cardiologist (physician) and Advanced Practice Providers (APPs -  Physician Assistants and Nurse Practitioners) who all work together to provide you with the care you need, when you need it.  Your next appointment:   1 month(s)  The format for your next appointment:   In Person  Provider:   Thomasene Ripple, DO  Other Instructions   Tips to Measure your Blood Pressure Correctly  To determine whether you have hypertension, a medical professional will take a blood pressure reading. How you prepare for the test, the position of your arm, and other factors can change a blood pressure reading by 10% or more. That could be enough to hide high blood pressure, start you on a drug you don't really need, or lead your doctor to incorrectly adjust your medications.  National and international guidelines offer specific instructions for measuring blood pressure. If a doctor, nurse, or medical assistant isn't doing it right, don't hesitate to ask him or her to get with the guidelines.  Here's what you can do to ensure a correct reading: .  Don't drink a caffeinated beverage or smoke during the 30 minutes before the test. . Sit quietly for five minutes  before the test begins. . During the measurement, sit in a chair with your feet on the floor and your arm supported so your elbow is at about heart level. . The inflatable part of the cuff should completely cover at least 80% of your upper arm, and the cuff should be placed on bare skin, not over a shirt. . Don't talk during the measurement. . Have your blood pressure measured twice, with a brief break in between. If the readings are different by 5 points or more, have it done a third time.  There are times to break these rules. If you sometimes feel lightheaded when getting out of bed in the morning or when you stand after sitting, you should have your blood pressure checked while seated and then while standing to see if it falls from one position to the next.  Because blood pressure varies throughout the day, your doctor will rarely diagnose hypertension on the basis of a single reading. Instead, he or she will want to confirm the measurements on at least two occasions, usually within a few weeks of one another. The exception to this rule is if you have a blood pressure reading of 180/110 mm Hg or higher. A result this high usually calls for prompt treatment.  It's a good idea to have your blood pressure measured in both arms at least once, since the reading in one arm (usually the right) may be higher than that in the left. A 2014 study in The American Journal of Medicine of nearly 3,400 people found average arm- to-arm differences in systolic blood pressure of about 5 points. The higher number should be used to make treatment decisions.  In 2017, new guidelines from the American Heart Association, the [employer] of Cardiology, and nine other health organizations lowered the diagnosis of high blood pressure to 130/80 mm Hg or higher for all adults. The guidelines also redefined the various blood pressure categories to now include normal, elevated, Stage 1 hypertension, Stage 2 hypertension, and  hypertensive crisis (see "Blood pressure categories").  Blood pressure categories  Blood pressure category SYSTOLIC (upper number)  DIASTOLIC (lower number)  Normal Less than 120 mm Hg and Less than 80 mm Hg  Elevated 120-129 mm Hg and Less than 80 mm Hg  High blood pressure: Stage 1 hypertension 130-139 mm Hg or 80-89 mm Hg  High blood pressure: Stage 2 hypertension 140 mm Hg or higher or 90 mm Hg or higher  Hypertensive crisis (consult your doctor immediately) Higher than 180 mm Hg and/or Higher than 120 mm Hg  Source: American Heart Association and American Stroke Association. For more on getting your blood pressure under control, buy Controlling Your Blood Pressure, a Special Health Report from [facility].   Blood Pressure Log   Date   Time  Blood Pressure  Position  Example: Nov 1 9 AM 124/78 sitting                                                        Adopting a Healthy Lifestyle.  Know what a healthy weight is for you (roughly BMI <25) and aim to maintain this   Aim for 7+ servings of  fruits and vegetables daily   65-80+ fluid ounces of water or unsweet tea for healthy kidneys   Limit to max 1 drink of alcohol per day; avoid smoking/tobacco   Limit animal fats in diet for cholesterol and heart health - choose grass fed whenever available   Avoid highly processed foods, and foods high in saturated/trans fats   Aim for low stress - take time to unwind and care for your mental health   Aim for 150 min of moderate intensity exercise weekly for heart health, and weights twice weekly for bone health   Aim for 7-9 hours of sleep daily   When it comes to diets, agreement about the perfect plan isnt easy to find, even among the experts. Experts at the [facility] [employer] developed an idea known as the Healthy Eating Plate. Just imagine a plate divided into logical, healthy portions.   The emphasis is on diet quality:    Load up on vegetables and fruits - one-half of your plate: Aim for color and variety, and remember that potatoes dont count.   Go for whole grains - one-quarter of your plate: Whole wheat, barley, wheat berries, quinoa, oats, brown rice, and foods made with them. If you want pasta, go with whole wheat pasta.   Protein power - one-quarter of your plate: Fish, chicken, beans, and nuts are all healthy, versatile protein sources. Limit red meat.   The diet, however, does go beyond the plate, offering a few other suggestions.   Use healthy plant oils, such as olive, canola, soy, corn, sunflower and peanut. Check the labels, and avoid partially hydrogenated oil, which have unhealthy trans fats.   If youre thirsty, drink water. Coffee and tea are good in moderation, but skip sugary drinks and limit milk and dairy products to one or two daily servings.   The type of carbohydrate in the diet is more important than the amount. Some sources of carbohydrates, such as vegetables, fruits, whole grains, and beans-are healthier than others.   Finally, stay active  Signed, Thomasene Ripple, DO  03/15/2019 4:40 PM    [facility]

## 2019-03-15 NOTE — Telephone Encounter (Signed)
Refill request: Meloxicam 15mg  Last refill: 03/10/2018, #90 x 1 RF Last OV: 01/25/19 (HTN); 07/25/2017 (spinal stenosis) Next OV: Not scheduled.

## 2019-03-15 NOTE — Patient Instructions (Addendum)
Medication Instructions:  Your physician recommends that you continue on your current medications as directed. Please refer to the Current Medication list given to you today.  *If you need a refill on your cardiac medications before your next appointment, please call your pharmacy*  Lab Work: None  If you have labs (blood work) drawn today and your tests are completely normal, you will receive your results only by: [name] MyChart Message (if you have MyChart) OR . A paper copy in the mail If you have any lab test that is abnormal or we need to change your treatment, we will call you to review the results.  Testing/Procedures: NOne  Follow-Up: At [facility], you and your health needs are our priority.  As part of our continuing mission to provide you with exceptional heart care, we have created designated Provider Care Teams.  These Care Teams include your primary Cardiologist (physician) and Advanced Practice Providers (APPs -  Physician Assistants and Nurse Practitioners) who all work together to provide you with the care you need, when you need it.  Your next appointment:   1 month(s)  The format for your next appointment:   In Person  Provider:   Berniece Salines, DO  Other Instructions   Tips to Measure your Blood Pressure Correctly  To determine whether you have hypertension, a medical professional will take a blood pressure reading. How you prepare for the test, the position of your arm, and other factors can change a blood pressure reading by 10% or more. That could be enough to hide high blood pressure, start you on a drug you don't really need, or lead your doctor to incorrectly adjust your medications.  National and international guidelines offer specific instructions for measuring blood pressure. If a doctor, nurse, or medical assistant isn't doing it right, don't hesitate to ask him or her to get with the guidelines.  Here's what you can do to ensure a correct reading: .  Don't drink a caffeinated beverage or smoke during the 30 minutes before the test. . Sit quietly for five minutes before the test begins. . During the measurement, sit in a chair with your feet on the floor and your arm supported so your elbow is at about heart level. . The inflatable part of the cuff should completely cover at least 80% of your upper arm, and the cuff should be placed on bare skin, not over a shirt. . Don't talk during the measurement. . Have your blood pressure measured twice, with a brief break in between. If the readings are different by 5 points or more, have it done a third time.  There are times to break these rules. If you sometimes feel lightheaded when getting out of bed in the morning or when you stand after sitting, you should have your blood pressure checked while seated and then while standing to see if it falls from one position to the next.  Because blood pressure varies throughout the day, your doctor will rarely diagnose hypertension on the basis of a single reading. Instead, he or she will want to confirm the measurements on at least two occasions, usually within a few weeks of one another. The exception to this rule is if you have a blood pressure reading of 180/110 mm Hg or higher. A result this high usually calls for prompt treatment.  It's a good idea to have your blood pressure measured in both arms at least once, since the reading in one arm (usually the right)  may be higher than that in the left. A 2014 study in The American Journal of Medicine of nearly 3,400 people found average arm- to-arm differences in systolic blood pressure of about 5 points. The higher number should be used to make treatment decisions.  In 2017, new guidelines from the American Heart Association, the [employer] of Cardiology, and nine other health organizations lowered the diagnosis of high blood pressure to 130/80 mm Hg or higher for all adults. The guidelines also redefined the  various blood pressure categories to now include normal, elevated, Stage 1 hypertension, Stage 2 hypertension, and hypertensive crisis (see "Blood pressure categories").  Blood pressure categories  Blood pressure category SYSTOLIC (upper number)  DIASTOLIC (lower number)  Normal Less than 120 mm Hg and Less than 80 mm Hg  Elevated 120-129 mm Hg and Less than 80 mm Hg  High blood pressure: Stage 1 hypertension 130-139 mm Hg or 80-89 mm Hg  High blood pressure: Stage 2 hypertension 140 mm Hg or higher or 90 mm Hg or higher  Hypertensive crisis (consult your doctor immediately) Higher than 180 mm Hg and/or Higher than 120 mm Hg  Source: American Heart Association and American Stroke Association. For more on getting your blood pressure under control, buy Controlling Your Blood Pressure, a Special Health Report from [facility].   Blood Pressure Log   Date   Time  Blood Pressure  Position  Example: Nov 1 9 AM 124/78 sitting

## 2019-03-22 ENCOUNTER — Encounter: Payer: Self-pay | Admitting: Orthopedic Surgery

## 2019-03-22 ENCOUNTER — Ambulatory Visit (INDEPENDENT_AMBULATORY_CARE_PROVIDER_SITE_OTHER): Payer: Medicare Other | Admitting: Orthopedic Surgery

## 2019-03-22 ENCOUNTER — Ambulatory Visit: Payer: Medicare Other

## 2019-03-22 ENCOUNTER — Other Ambulatory Visit: Payer: Self-pay

## 2019-03-22 ENCOUNTER — Ambulatory Visit: Payer: Self-pay

## 2019-03-22 VITALS — Ht 65.0 in | Wt 148.0 lb

## 2019-03-22 DIAGNOSIS — M25551 Pain in right hip: Secondary | ICD-10-CM

## 2019-03-22 DIAGNOSIS — M48061 Spinal stenosis, lumbar region without neurogenic claudication: Secondary | ICD-10-CM

## 2019-03-22 MED ORDER — PREDNISONE 10 MG PO TABS: 20.0000 mg | ORAL_TABLET | Freq: Every day | ORAL | 1 refills | Status: DC

## 2019-03-22 NOTE — Progress Notes (Signed)
Office Visit Note   Patient: Glenda Lyons           Date of Birth: Jun 14, 1945           MRN: [id number] Visit Date: 03/22/2019              Requested by: [address] [place], [place] [address] [place],  [place] [address] PCP: Zola Button, Grayling Congress, DO  Chief Complaint  Patient presents with  . Right Hip - Pain      HPI: Patient is a 74 year old woman who presents complaining of right buttocks pain that radiates anteriorly to her groin.  Patient states that she has been having hip pain for years recently gotten worse on the right hip.  Patient is status post lumbar spine fusion at L4-5 in 2016 at [facility] by Dr. Andrey Campanile.  Patient is status post an MRI scan in June 2019 of her lumbar spine.  Assessment & Plan: Visit Diagnoses:  1. Pain in right hip   2. Spinal stenosis of lumbar region without neurogenic claudication     Plan: Will set patient up for a new MRI scan for the possibility of a epidural steroid injection.  We will start her on a prednisone taper at 20 mg with breakfast in the morning and taper off as tolerated.  Follow-Up Instructions: Return in about 2 weeks (around 04/05/2019).   Ortho Exam  Patient is alert, oriented, no adenopathy, well-dressed, normal affect, normal respiratory effort. Examination patient's plain radiographs shows collapse through the joint space at L3-4 with osteophytic bone spurs.  The radiograph does not show disc space more proximally.  She has a negative straight leg raise no pain with range of motion of the hip knee or ankle she has good motor strength in both lower extremities.  Her MRI scan from 2019 was reviewed which showed no herniated disc or significant stenosis.  Imaging: XR HIP UNILAT W OR W/O PELVIS 1V RIGHT  Result Date: 03/22/2019 2 view radiographs of the right hip shows a congruent joint space with no subochondral defects no bony spurs  XR Pelvis 1-2 Views  Result Date: 03/22/2019 AP of the  pelvis shows previous internal fixation fusion at L4-5.  The hip joint spaces are congruent no pelvic fractures minimal degenerative changes of the hips.  No images are attached to the encounter.  Labs: Lab Results  Component Value Date   HGBA1C 6.1 (H) 12/15/2005   LABORGA KLEBSIELLA PNEUMONIAE 06/01/2015     Lab Results  Component Value Date   ALBUMIN 4.8 01/25/2019   ALBUMIN 4.4 03/26/2017   ALBUMIN 4.6 09/23/2016    No results found for: MG No results found for: VD25OH  No results found for: PREALBUMIN CBC EXTENDED Latest Ref Rng & Units 01/25/2019 09/23/2016 06/01/2015  WBC 4.0 - 10.5 K/uL 8.5 6.9 7.2  RBC 3.87 - 5.11 Mil/uL 4.93 4.63 5.01  HGB 12.0 - 15.0 g/dL 15.4(H) 14.8 15.5  HCT 36.0 - 46.0 % 46.5(H) 44.6 46.1(H)  PLT 150.0 - 400.0 K/uL 250.0 222.0 239  NEUTROABS 1.4 - 7.7 K/uL 5.5 3.9 4,896  LYMPHSABS 0.7 - 4.0 K/uL 2.1 2.3 1,656     Body mass index is 24.63 kg/m.  Orders:  Orders Placed This Encounter  Procedures  . XR Pelvis 1-2 Views  . XR HIP UNILAT W OR W/O PELVIS 1V RIGHT   No orders of the defined types were placed in this encounter.    Procedures:  No procedures performed  Clinical Data: No additional findings.  ROS:  All other systems negative, except as noted in the HPI. Review of Systems  Objective: Vital Signs: Ht 5\' 5"  (1.651 m)   Wt 148 lb (67.1 kg)   BMI 24.63 kg/m   Specialty Comments:  No specialty comments available.  PMFS History: Patient Active Problem List   Diagnosis Date Noted  . Palpitation 01/25/2019  . New onset left bundle branch block (LBBB) 01/25/2019  . Acute bacterial sinusitis 12/26/2014  . Bilateral conjunctivitis 12/26/2014  . Status post lumbar spinal fusion 12/21/2014  . 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 Skin laxity 08/20/2013  . 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 Seborrheic keratoses 07/08/2012  . 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 Esodeviation 12/19/2010  . Dysuria 06/14/2010  . COLONIC POLYPS, HX OF 03/13/2010  . Hyperlipidemia 06/03/2007  . OSTEOARTHRITIS 06/03/2007  . UNSPECIFIED PERIPHERAL VASCULAR DISEASE 12/29/2006  . POSTMENOPAUSAL STATUS 12/29/2006  . DEPRESSION 07/10/2006  . Essential hypertension 07/10/2006  . HERNIATED DISC 07/10/2006  . BURSITIS, RIGHT HIP 07/10/2006   Past Medical History:  Diagnosis Date  . Allergy    seasonal  . Depression   . Herniated disc   . Hyperlipemia   . Hypertension   . Neuromuscular disorder ([place])   . Osteoarthritis   . Sciatica     Family History  Problem Relation Age of Onset  . Heart attack Unknown   . Lymphoma Unknown   . Breast cancer Mother   . Colon cancer Neg Hx     Past Surgical History:  Procedure Laterality Date  . BIOPSY BREAST    . BREAST BIOPSY Right   . CYSTECTOMY     leg  . DILATION AND CURETTAGE OF UTERUS  09/11/2010   Procedure: DILATATION AND CURETTAGE (D&C);  Surgeon: Betsy Coder, MD;  Location: [facility];  Service: Gynecology;  Laterality: N/A;  . ENDOMETRIAL BIOPSY    . HYSTEROSCOPY WITH RESECTOSCOPE  09/11/2010   Procedure: HYSTEROSCOPY WITH RESECTOSCOPE;  Surgeon: Betsy Coder, MD;  Location: [facility];  Service: Gynecology;  Laterality: N/A;  . LEEP  1994  . TONSILLECTOMY     Social History   Occupational History  . Not on file  Tobacco Use  . Smoking status: Never Smoker  . Smokeless tobacco: Never Used  Substance and Sexual Activity  . Alcohol use: Yes    Comment: occasionally  . Drug use: No  . Sexual activity: Yes    Partners: Male

## 2019-03-26 ENCOUNTER — Ambulatory Visit (INDEPENDENT_AMBULATORY_CARE_PROVIDER_SITE_OTHER): Payer: Medicare Other

## 2019-03-26 ENCOUNTER — Other Ambulatory Visit: Payer: Self-pay

## 2019-03-26 DIAGNOSIS — M48061 Spinal stenosis, lumbar region without neurogenic claudication: Secondary | ICD-10-CM | POA: Diagnosis not present

## 2019-04-01 ENCOUNTER — Telehealth: Payer: Self-pay

## 2019-04-01 ENCOUNTER — Other Ambulatory Visit: Payer: Self-pay | Admitting: Family Medicine

## 2019-04-01 DIAGNOSIS — Z1231 Encounter for screening mammogram for malignant neoplasm of breast: Secondary | ICD-10-CM

## 2019-04-01 DIAGNOSIS — E2839 Other primary ovarian failure: Secondary | ICD-10-CM

## 2019-04-01 NOTE — Telephone Encounter (Signed)
Patient called in to see if Dr. Laury Axon can put in a Referral for a Bone Density scan. Please follow up with the patient  At [phone]  Thanks,

## 2019-04-01 NOTE — Telephone Encounter (Signed)
Please advice  

## 2019-04-01 NOTE — Telephone Encounter (Signed)
Order has been placed.

## 2019-04-04 ENCOUNTER — Encounter: Payer: Self-pay | Admitting: Orthopedic Surgery

## 2019-04-04 ENCOUNTER — Other Ambulatory Visit: Payer: Self-pay

## 2019-04-04 ENCOUNTER — Ambulatory Visit (INDEPENDENT_AMBULATORY_CARE_PROVIDER_SITE_OTHER): Payer: Medicare Other | Admitting: Orthopedic Surgery

## 2019-04-04 DIAGNOSIS — M48061 Spinal stenosis, lumbar region without neurogenic claudication: Secondary | ICD-10-CM | POA: Diagnosis not present

## 2019-04-04 NOTE — Telephone Encounter (Signed)
VM left. Advised pt that orders were placed and she needs to call imaging to set up appointment. Phone number left as well.

## 2019-04-04 NOTE — Progress Notes (Signed)
Office Visit Note   Patient: Glenda Lyons           Date of Birth: 10-Oct-1945           MRN: [id number] Visit Date: 04/04/2019              Requested by: [address] [place], [place] [address] [place],  [place] [address] PCP: Carollee Herter, Alferd Apa, DO  Chief Complaint  Patient presents with  . Lower Back - Pain      HPI: Patient is a 74 year old woman who presents in follow-up for lower back pain primarily on the left side.  She is status post discectomy at L4-5.  Patient states he has been taking the prednisone 10 mg with breakfast and she states that this did help with her pain.  Patient states that recently her blood pressure has been elevated she states this morning it was 159/96 and this was before she took her prednisone.  Assessment & Plan: Visit Diagnoses:  1. Spinal stenosis of lumbar region without neurogenic claudication     Plan: Will set patient up with Dr. Ernestina Patches for evaluation for epidural steroid injection.  Patient is following up with cardiology for her hypertension.  Discussed that the prednisone could contribute to her elevated blood pressure.  Follow-Up Instructions: Return if symptoms worsen or fail to improve.   Ortho Exam  Patient is alert, oriented, no adenopathy, well-dressed, normal affect, normal respiratory effort. Examination patient has a negative straight leg raise bilaterally no pain with range of motion of the hip knee or ankle.  There is no focal motor weakness in the left lower extremity motor strength is symmetric bilaterally.  Review the MRI scan shows degenerative changes at L2-3 with disc protrusion as well as degenerative changes at L3-4 with lateral stenosis.  There is no change in her previous surgical level at L4-5 and there is degenerative changes at L5-S1.  Imaging: No results found. No images are attached to the encounter.  Labs: Lab Results  Component Value Date   HGBA1C 6.1 (H) 12/15/2005   LABORGA  KLEBSIELLA PNEUMONIAE 06/01/2015     Lab Results  Component Value Date   ALBUMIN 4.8 01/25/2019   ALBUMIN 4.4 03/26/2017   ALBUMIN 4.6 09/23/2016    No results found for: MG No results found for: VD25OH  No results found for: PREALBUMIN CBC EXTENDED Latest Ref Rng & Units 01/25/2019 09/23/2016 06/01/2015  WBC 4.0 - 10.5 K/uL 8.5 6.9 7.2  RBC 3.87 - 5.11 Mil/uL 4.93 4.63 5.01  HGB 12.0 - 15.0 g/dL 15.4(H) 14.8 15.5  HCT 36.0 - 46.0 % 46.5(H) 44.6 46.1(H)  PLT 150.0 - 400.0 K/uL 250.0 222.0 239  NEUTROABS 1.4 - 7.7 K/uL 5.5 3.9 4,896  LYMPHSABS 0.7 - 4.0 K/uL 2.1 2.3 1,656     There is no height or weight on file to calculate BMI.  Orders:  No orders of the defined types were placed in this encounter.  No orders of the defined types were placed in this encounter.    Procedures: No procedures performed  Clinical Data: No additional findings.  ROS:  All other systems negative, except as noted in the HPI. Review of Systems  Objective: Vital Signs: There were no vitals taken for this visit.  Specialty Comments:  No specialty comments available.  PMFS History: Patient Active Problem List   Diagnosis Date Noted  . Palpitation 01/25/2019  . New onset left bundle branch block (LBBB) 01/25/2019  .  Acute bacterial sinusitis 12/26/2014  . Bilateral conjunctivitis 12/26/2014  . Status post lumbar spinal fusion 12/21/2014  . 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 Skin laxity 08/20/2013  . 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 Seborrheic keratoses 07/08/2012  . 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 Esodeviation 12/19/2010  . Dysuria 06/14/2010  . COLONIC POLYPS, HX OF 03/13/2010  . Hyperlipidemia 06/03/2007  . OSTEOARTHRITIS 06/03/2007  . UNSPECIFIED PERIPHERAL VASCULAR DISEASE 12/29/2006  . POSTMENOPAUSAL STATUS 12/29/2006  . DEPRESSION 07/10/2006  . Essential hypertension  07/10/2006  . HERNIATED DISC 07/10/2006  . BURSITIS, RIGHT HIP 07/10/2006   Past Medical History:  Diagnosis Date  . Allergy    seasonal  . Depression   . Herniated disc   . Hyperlipemia   . Hypertension   . Neuromuscular disorder ([facility])   . Osteoarthritis   . Sciatica     Family History  Problem Relation Age of Onset  . Heart attack Unknown   . Lymphoma Unknown   . Breast cancer Mother   . Colon cancer Neg Hx     Past Surgical History:  Procedure Laterality Date  . BIOPSY BREAST    . BREAST BIOPSY Right   . CYSTECTOMY     leg  . DILATION AND CURETTAGE OF UTERUS  09/11/2010   Procedure: DILATATION AND CURETTAGE (D&C);  Surgeon: Michael Litter, MD;  Location: [facility];  Service: Gynecology;  Laterality: N/A;  . ENDOMETRIAL BIOPSY    . HYSTEROSCOPY WITH RESECTOSCOPE  09/11/2010   Procedure: HYSTEROSCOPY WITH RESECTOSCOPE;  Surgeon: Michael Litter, MD;  Location: [facility];  Service: Gynecology;  Laterality: N/A;  . LEEP  1994  . TONSILLECTOMY     Social History   Occupational History  . Not on file  Tobacco Use  . Smoking status: Never Smoker  . Smokeless tobacco: Never Used  Substance and Sexual Activity  . Alcohol use: Yes    Comment: occasionally  . Drug use: No  . Sexual activity: Yes    Partners: Male

## 2019-04-05 ENCOUNTER — Telehealth: Payer: Self-pay

## 2019-04-05 ENCOUNTER — Ambulatory Visit
Admission: RE | Admit: 2019-04-05 | Discharge: 2019-04-05 | Disposition: A | Discharge status: Home / Self Care | Payer: Medicare Other | Source: Ambulatory Visit | Attending: Family Medicine | Admitting: Family Medicine

## 2019-04-05 DIAGNOSIS — Z1231 Encounter for screening mammogram for malignant neoplasm of breast: Secondary | ICD-10-CM

## 2019-04-06 ENCOUNTER — Ambulatory Visit: Payer: Medicare Other

## 2019-04-13 ENCOUNTER — Ambulatory Visit: Payer: Medicare Other | Admitting: Cardiology

## 2019-04-18 ENCOUNTER — Other Ambulatory Visit: Payer: Self-pay | Admitting: Family Medicine

## 2019-04-18 DIAGNOSIS — E785 Hyperlipidemia, unspecified: Secondary | ICD-10-CM

## 2019-04-19 ENCOUNTER — Encounter: Payer: Medicare Other | Admitting: Physical Medicine and Rehabilitation

## 2019-04-23 ENCOUNTER — Other Ambulatory Visit: Payer: Self-pay | Admitting: Family Medicine

## 2019-04-23 DIAGNOSIS — E785 Hyperlipidemia, unspecified: Secondary | ICD-10-CM

## 2019-05-02 ENCOUNTER — Other Ambulatory Visit: Payer: Self-pay

## 2019-05-02 ENCOUNTER — Ambulatory Visit: Payer: Medicare Other | Admitting: Family Medicine

## 2019-05-02 ENCOUNTER — Encounter: Payer: Self-pay | Admitting: Family Medicine

## 2019-05-02 VITALS — BP 152/69 | HR 76 | Temp 96.8°F | Resp 18 | Ht 65.0 in | Wt 145.2 lb

## 2019-05-02 DIAGNOSIS — E785 Hyperlipidemia, unspecified: Secondary | ICD-10-CM | POA: Diagnosis not present

## 2019-05-02 DIAGNOSIS — G8929 Other chronic pain: Secondary | ICD-10-CM

## 2019-05-02 DIAGNOSIS — M545 Low back pain, unspecified: Secondary | ICD-10-CM

## 2019-05-02 DIAGNOSIS — I10 Essential (primary) hypertension: Secondary | ICD-10-CM | POA: Diagnosis not present

## 2019-05-02 DIAGNOSIS — F320 Major depressive disorder, single episode, mild: Secondary | ICD-10-CM

## 2019-05-02 HISTORY — DX: Other chronic pain: G89.29

## 2019-05-02 HISTORY — DX: Low back pain, unspecified: M54.50

## 2019-05-02 MED ORDER — SERTRALINE HCL 100 MG PO TABS: ORAL_TABLET | ORAL | 3 refills | Status: DC

## 2019-05-02 NOTE — Progress Notes (Signed)
Patient ID: Glenda Lyons, female    DOB: April 15, 1945  Age: 74 y.o. MRN: [id number]    Subjective:  Subjective  HPI ABAGAEL KRAMM presents for f/u bp and cholesterol  No complaints.   Her husband has been going through a lot.  He had an amputation and then had uti and had to be admitted.  Pt is stressed ---  But things are settling down . She has lost some weight since her last visit as well   Review of Systems  Constitutional: Negative for appetite change, diaphoresis, fatigue and unexpected weight change.  Eyes: Negative for pain, redness and visual disturbance.  Respiratory: Negative for cough, chest tightness, shortness of breath and wheezing.   Cardiovascular: Negative for chest pain, palpitations and leg swelling.  Endocrine: Negative for cold intolerance, heat intolerance, polydipsia, polyphagia and polyuria.  Genitourinary: Negative for difficulty urinating, dysuria and frequency.  Neurological: Negative for dizziness, light-headedness, numbness and headaches.    History Past Medical History:  Diagnosis Date  . Allergy    seasonal  . Depression   . Herniated disc   . Hyperlipemia   . Hypertension   . Neuromuscular disorder (HCC)   . Osteoarthritis   . Sciatica     She has a past surgical history that includes Tonsillectomy; Biopsy breast; LEEP (1994); Hysteroscopy with resectoscope (09/11/2010); Dilation and curettage of uterus (09/11/2010); Endometrial biopsy; Cystectomy; and Breast biopsy (Right).   Her family history includes Breast cancer in her mother; Heart attack in an other family member; Lymphoma in an other family member.She reports that she has never smoked. She has never used smokeless tobacco. She reports current alcohol use. She reports that she does not use drugs.  Current Outpatient Medications on File Prior to Visit  Medication Sig Dispense Refill  . aspirin 81 MG tablet Take 81 mg by mouth daily.    . Calcium Carb-Cholecalciferol (CALCIUM-VITAMIN D)  500-400 MG-UNIT TABS Take by mouth daily.    . Flaxseed, Linseed, (FLAX SEED OIL) 1000 MG CAPS Take by mouth daily.    . fluticasone (FLONASE) 50 MCG/ACT nasal spray instill 2 sprays into each nostril at bedtime 16 g 2  . losartan-hydrochlorothiazide (HYZAAR) 100-25 MG tablet Take 1 tablet by mouth daily. 90 tablet 3  . meloxicam (MOBIC) 15 MG tablet TAKE 1/2 TO 1 TABLET BY MOUTH EVERY DAY 90 tablet 0  . simvastatin (ZOCOR) 40 MG tablet TAKE 1 TABLET(40 MG) BY MOUTH AT BEDTIME 30 tablet 2  . VITAMIN A PO Take 3,000 mg by mouth daily.    . vitamin C (ASCORBIC ACID) 250 MG tablet Take 250 mg by mouth daily.     No current facility-administered medications on file prior to visit.     Objective:  Objective  Physical Exam Vitals and nursing note reviewed.  Constitutional:      Appearance: She is well-developed.  HENT:     Head: Normocephalic and atraumatic.  Eyes:     Conjunctiva/sclera: Conjunctivae normal.  Neck:     Thyroid: No thyromegaly.     Vascular: No carotid bruit or JVD.  Cardiovascular:     Rate and Rhythm: Normal rate and regular rhythm.     Heart sounds: Normal heart sounds. No murmur.  Pulmonary:     Effort: Pulmonary effort is normal. No respiratory distress.     Breath sounds: Normal breath sounds. No wheezing or rales.  Chest:     Chest wall: No tenderness.  Musculoskeletal:     Cervical back:  Normal range of motion and neck supple.  Neurological:     Mental Status: She is alert and oriented to person, place, and time.    BP (!) 152/69 (BP Location: Left Arm, Patient Position: Sitting, Cuff Size: Normal)   Pulse 76   Temp (!) 96.8 F (36 C) (Temporal)   Resp 18   Ht 5\' 5"  (1.651 m)   Wt 145 lb 4 oz (65.9 kg)   SpO2 99%   BMI 24.17 kg/m  Wt Readings from Last 3 Encounters:  05/02/19 145 lb 4 oz (65.9 kg)  03/22/19 148 lb (67.1 kg)  03/15/19 152 lb (68.9 kg)     Lab Results  Component Value Date   WBC 8.5 01/25/2019   HGB 15.4 (H) 01/25/2019    HCT 46.5 (H) 01/25/2019   PLT 250.0 01/25/2019   GLUCOSE 84 01/25/2019   CHOL 231 (H) 01/25/2019   TRIG 115.0 01/25/2019   HDL 69.00 01/25/2019   LDLCALC 139 (H) 01/25/2019   ALT 20 01/25/2019   AST 20 01/25/2019   NA 139 01/25/2019   K 4.9 01/25/2019   CL 101 01/25/2019   CREATININE 0.76 01/25/2019   BUN 20 01/25/2019   CO2 30 01/25/2019   TSH 3.50 01/25/2019   HGBA1C 6.1 (H) 12/15/2005    MM 3D SCREEN BREAST BILATERAL  Result Date: 04/05/2019 CLINICAL DATA:  Screening. EXAM: DIGITAL SCREENING BILATERAL MAMMOGRAM WITH TOMO AND CAD COMPARISON:  Previous exam(s). ACR Breast Density Category c: The breast tissue is heterogeneously dense, which may obscure small masses. FINDINGS: There are no findings suspicious for malignancy. Images were processed with CAD. IMPRESSION: No mammographic evidence of malignancy. A result letter of this screening mammogram will be mailed directly to the patient. RECOMMENDATION: Screening mammogram in one year. (Code:SM-B-01Y) BI-RADS CATEGORY  1: Negative. Electronically Signed   By: Lovey Newcomer M.D.   On: 04/05/2019 15:50     Assessment & Plan:  Plan  I have discontinued Hagen Tidd. Caylor "Marie"'s predniSONE. I have also changed her sertraline. Additionally, I am having her maintain her fluticasone, aspirin, vitamin C, Calcium-Vitamin D, Flax Seed Oil, losartan-hydrochlorothiazide, meloxicam, VITAMIN A PO, and simvastatin.  Meds ordered this encounter  Medications  . sertraline (ZOLOFT) 100 MG tablet    Sig: 1 1/2 po qd    Dispense:  135 tablet    Refill:  3    Problem List Items Addressed This Visit      Unprioritized   Chronic midline low back pain without sciatica    Per ortho Pt does not want to take meds---  recc she try salonpas with lidocaine patch       Depression, major, single episode, mild (HCC)   Relevant Medications   sertraline (ZOLOFT) 100 MG tablet   Essential hypertension    Slightly elevated today --- but its been good at  home She is seeing cardiology this week Pt is in a lot of pain       Relevant Orders   Lipid panel   Comprehensive metabolic panel   Hyperlipidemia - Primary    Encouraged heart healthy diet, increase exercise, avoid trans fats, consider a krill oil cap daily      Relevant Orders   Lipid panel   Comprehensive metabolic panel      Follow-up: Return in about 6 months (around 11/02/2019), or if symptoms worsen or fail to improve, for annual exam, fasting.  [name], DO

## 2019-05-02 NOTE — Assessment & Plan Note (Signed)
Per ortho Pt does not want to take meds---  recc she try salonpas with lidocaine patch

## 2019-05-02 NOTE — Patient Instructions (Signed)
DASH Eating Plan DASH stands for "Dietary Approaches to Stop Hypertension." The DASH eating plan is a healthy eating plan that has been shown to reduce high blood pressure (hypertension). It may also reduce your risk for type 2 diabetes, heart disease, and stroke. The DASH eating plan may also help with weight loss. What are tips for following this plan?  General guidelines  Avoid eating more than 2,300 mg (milligrams) of salt (sodium) a day. If you have hypertension, you may need to reduce your sodium intake to 1,500 mg a day.  Limit alcohol intake to no more than 1 drink a day for nonpregnant women and 2 drinks a day for men. One drink equals 12 oz of beer, 5 oz of wine, or 1 oz of hard liquor.  Work with your health care provider to maintain a healthy body weight or to lose weight. Ask what an ideal weight is for you.  Get at least 30 minutes of exercise that causes your heart to beat faster (aerobic exercise) most days of the week. Activities may include walking, swimming, or biking.  Work with your health care provider or diet and nutrition specialist (dietitian) to adjust your eating plan to your individual calorie needs. Reading food labels   Check food labels for the amount of sodium per serving. Choose foods with less than 5 percent of the Daily Value of sodium. Generally, foods with less than 300 mg of sodium per serving fit into this eating plan.  To find whole grains, look for the word "whole" as the first word in the ingredient list. Shopping  Buy products labeled as "low-sodium" or "no salt added."  Buy fresh foods. Avoid canned foods and premade or frozen meals. Cooking  Avoid adding salt when cooking. Use salt-free seasonings or herbs instead of table salt or sea salt. Check with your health care provider or pharmacist before using salt substitutes.  Do not fry foods. Cook foods using healthy methods such as baking, boiling, grilling, and broiling instead.  Cook with  heart-healthy oils, such as olive, canola, soybean, or sunflower oil. Meal planning  Eat a balanced diet that includes: ? 5 or more servings of fruits and vegetables each day. At each meal, try to fill half of your plate with fruits and vegetables. ? Up to 6-8 servings of whole grains each day. ? Less than 6 oz of lean meat, poultry, or fish each day. A 3-oz serving of meat is about the same size as a deck of cards. One egg equals 1 oz. ? 2 servings of low-fat dairy each day. ? A serving of nuts, seeds, or beans 5 times each week. ? Heart-healthy fats. Healthy fats called Omega-3 fatty acids are found in foods such as flaxseeds and coldwater fish, like sardines, salmon, and mackerel.  Limit how much you eat of the following: ? Canned or prepackaged foods. ? Food that is high in trans fat, such as fried foods. ? Food that is high in saturated fat, such as fatty meat. ? Sweets, desserts, sugary drinks, and other foods with added sugar. ? Full-fat dairy products.  Do not salt foods before eating.  Try to eat at least 2 vegetarian meals each week.  Eat more home-cooked food and less restaurant, buffet, and fast food.  When eating at a restaurant, ask that your food be prepared with less salt or no salt, if possible. What foods are recommended? The items listed may not be a complete list. Talk with your dietitian about   what dietary choices are best for you. Grains Whole-grain or whole-wheat bread. Whole-grain or whole-wheat pasta. Brown rice. Oatmeal. Quinoa. Bulgur. Whole-grain and low-sodium cereals. Pita bread. Low-fat, low-sodium crackers. Whole-wheat flour tortillas. Vegetables Fresh or frozen vegetables (raw, steamed, roasted, or grilled). Low-sodium or reduced-sodium tomato and vegetable juice. Low-sodium or reduced-sodium tomato sauce and tomato paste. Low-sodium or reduced-sodium canned vegetables. Fruits All fresh, dried, or frozen fruit. Canned fruit in natural juice (without  added sugar). Meat and other protein foods Skinless chicken or turkey. Ground chicken or turkey. Pork with fat trimmed off. Fish and seafood. Egg whites. Dried beans, peas, or lentils. Unsalted nuts, nut butters, and seeds. Unsalted canned beans. Lean cuts of beef with fat trimmed off. Low-sodium, lean deli meat. Dairy Low-fat (1%) or fat-free (skim) milk. Fat-free, low-fat, or reduced-fat cheeses. Nonfat, low-sodium ricotta or cottage cheese. Low-fat or nonfat yogurt. Low-fat, low-sodium cheese. Fats and oils Soft margarine without trans fats. Vegetable oil. Low-fat, reduced-fat, or light mayonnaise and salad dressings (reduced-sodium). Canola, safflower, olive, soybean, and sunflower oils. Avocado. Seasoning and other foods Herbs. Spices. Seasoning mixes without salt. Unsalted popcorn and pretzels. Fat-free sweets. What foods are not recommended? The items listed may not be a complete list. Talk with your dietitian about what dietary choices are best for you. Grains Baked goods made with fat, such as croissants, muffins, or some breads. Dry pasta or rice meal packs. Vegetables Creamed or fried vegetables. Vegetables in a cheese sauce. Regular canned vegetables (not low-sodium or reduced-sodium). Regular canned tomato sauce and paste (not low-sodium or reduced-sodium). Regular tomato and vegetable juice (not low-sodium or reduced-sodium). Pickles. Olives. Fruits Canned fruit in a light or heavy syrup. Fried fruit. Fruit in cream or butter sauce. Meat and other protein foods Fatty cuts of meat. Ribs. Fried meat. Bacon. Sausage. Bologna and other processed lunch meats. Salami. Fatback. Hotdogs. Bratwurst. Salted nuts and seeds. Canned beans with added salt. Canned or smoked fish. Whole eggs or egg yolks. Chicken or turkey with skin. Dairy Whole or 2% milk, cream, and half-and-half. Whole or full-fat cream cheese. Whole-fat or sweetened yogurt. Full-fat cheese. Nondairy creamers. Whipped toppings.  Processed cheese and cheese spreads. Fats and oils Butter. Stick margarine. Lard. Shortening. Ghee. Bacon fat. Tropical oils, such as coconut, palm kernel, or palm oil. Seasoning and other foods Salted popcorn and pretzels. Onion salt, garlic salt, seasoned salt, table salt, and sea salt. Worcestershire sauce. Tartar sauce. Barbecue sauce. Teriyaki sauce. Soy sauce, including reduced-sodium. Steak sauce. Canned and packaged gravies. Fish sauce. Oyster sauce. Cocktail sauce. Horseradish that you find on the shelf. Ketchup. Mustard. Meat flavorings and tenderizers. Bouillon cubes. Hot sauce and Tabasco sauce. Premade or packaged marinades. Premade or packaged taco seasonings. Relishes. Regular salad dressings. Where to find more information:  National Heart, Lung, and Blood Institute: www.nhlbi.nih.gov  American Heart Association: www.heart.org Summary  The DASH eating plan is a healthy eating plan that has been shown to reduce high blood pressure (hypertension). It may also reduce your risk for type 2 diabetes, heart disease, and stroke.  With the DASH eating plan, you should limit salt (sodium) intake to 2,300 mg a day. If you have hypertension, you may need to reduce your sodium intake to 1,500 mg a day.  When on the DASH eating plan, aim to eat more fresh fruits and vegetables, whole grains, lean proteins, low-fat dairy, and heart-healthy fats.  Work with your health care provider or diet and nutrition specialist (dietitian) to adjust your eating plan to your   individual calorie needs. This information is not intended to replace advice given to you by your health care provider. Make sure you discuss any questions you have with your health care provider. Document Revised: 01/09/2017 Document Reviewed: 01/21/2016 Elsevier Patient Education  2020 Elsevier Inc.  

## 2019-05-02 NOTE — Assessment & Plan Note (Signed)
Slightly elevated today --- but its been good at home She is seeing cardiology this week Pt is in a lot of pain

## 2019-05-02 NOTE — Assessment & Plan Note (Signed)
Encouraged heart healthy diet, increase exercise, avoid trans fats, consider a krill oil cap daily 

## 2019-05-03 LAB — LIPID PANEL
Cholesterol: 160 mg/dL (ref 0–200)
HDL: 59.7 mg/dL (ref >39.00)
LDL Cholesterol: 84 mg/dL (ref 0–99)
NonHDL: 100.7
Total CHOL/HDL Ratio: 3
Triglycerides: 85 mg/dL (ref 0.0–149.0)
VLDL: 17 mg/dL (ref 0.0–40.0)

## 2019-05-03 LAB — COMPREHENSIVE METABOLIC PANEL
ALT: 19 U/L (ref 0–35)
AST: 21 U/L (ref 0–37)
Albumin: 4.4 g/dL (ref 3.5–5.2)
Alkaline Phosphatase: 64 U/L (ref 39–117)
BUN: 23 mg/dL (ref 6–23)
CO2: 29 mEq/L (ref 19–32)
Calcium: 9.3 mg/dL (ref 8.4–10.5)
Chloride: 104 mEq/L (ref 96–112)
Creatinine, Ser: 0.68 mg/dL (ref 0.40–1.20)
GFR: 84.52 mL/min (ref >60.00)
Glucose, Bld: 103 mg/dL — ABNORMAL HIGH (ref 70–99)
Potassium: 4.1 mEq/L (ref 3.5–5.1)
Sodium: 139 mEq/L (ref 135–145)
Total Bilirubin: 0.4 mg/dL (ref 0.2–1.2)
Total Protein: 6.5 g/dL (ref 6.0–8.3)

## 2019-05-05 ENCOUNTER — Ambulatory Visit (INDEPENDENT_AMBULATORY_CARE_PROVIDER_SITE_OTHER): Payer: Medicare Other | Admitting: Cardiology

## 2019-05-05 ENCOUNTER — Encounter: Payer: Self-pay | Admitting: Cardiology

## 2019-05-05 ENCOUNTER — Other Ambulatory Visit: Payer: Self-pay

## 2019-05-05 VITALS — BP 130/88 | HR 71 | Ht 65.0 in | Wt 141.0 lb

## 2019-05-05 DIAGNOSIS — I10 Essential (primary) hypertension: Secondary | ICD-10-CM

## 2019-05-05 NOTE — Patient Instructions (Signed)
Medication Instructions:  Your physician recommends that you continue on your current medications as directed. Please refer to the Current Medication list given to you today.  *If you need a refill on your cardiac medications before your next appointment, please call your pharmacy*   Lab Work: None ordered  If you have labs (blood work) drawn today and your tests are completely normal, you will receive your results only by: [name] MyChart Message (if you have MyChart) OR . A paper copy in the mail If you have any lab test that is abnormal or we need to change your treatment, we will call you to review the results.   Testing/Procedures: None ordered   Follow-Up: At [facility], you and your health needs are our priority.  As part of our continuing mission to provide you with exceptional heart care, we have created designated Provider Care Teams.  These Care Teams include your primary Cardiologist (physician) and Advanced Practice Providers (APPs -  Physician Assistants and Nurse Practitioners) who all work together to provide you with the care you need, when you need it.  We recommend signing up for the patient portal called "MyChart".  Sign up information is provided on this After Visit Summary.  MyChart is used to connect with patients for Virtual Visits (Telemedicine).  Patients are able to view lab/test results, encounter notes, upcoming appointments, etc.  Non-urgent messages can be sent to your provider as well.   To learn more about what you can do with MyChart, go to [email or web address].    Your next appointment:   2 month(s)  The format for your next appointment:   In Person  Provider:   Thomasene Ripple, DO   Other Instructions

## 2019-05-05 NOTE — Progress Notes (Signed)
Cardiology Office Note:    Date:  05/05/2019   ID:  Glenda ButtMartha M Stogsdill, DOB 1945/12/13, MRN [id number]  PCP:  Zola ButtonLowne Chase, Grayling CongressYvonne R, DO  Cardiologist:  Thomasene RippleKardie Goble Fudala, DO  Electrophysiologist:  None   Referring MD: Zola ButtonLowne Chase, Grayling CongressYvonne R, *   Follow up visit  History of Present Illness:    Glenda Lyons is a 74 y.o. female with a hx of hypertension, hyperlipidemia, left bundle branch block presents today for follow-up visit.  I last saw the patient in February 2021 at that time we again went over results of her pharmacologic nuclear stress test as well as an echocardiogram.  During the visit her blood pressure was slightly elevated but the patient did tell me at home she her systolic blood pressure 120-130s and preferred not to have any additional medication.  Therefore no medication were added to her regimen to avoid hypotension.  Today she is here for follow-up visit.  She offers no complaints at this time.   Past Medical History:  Diagnosis Date  . Allergy    seasonal  . Depression   . Herniated disc   . Hyperlipemia   . Hypertension   . Neuromuscular disorder (HCC)   . Osteoarthritis   . Sciatica     Past Surgical History:  Procedure Laterality Date  . BIOPSY BREAST    . BREAST BIOPSY Right   . CYSTECTOMY     leg  . DILATION AND CURETTAGE OF UTERUS  09/11/2010   Procedure: DILATATION AND CURETTAGE (D&C);  Surgeon: Michael LitterNaima A Dillard, MD;  Location: [facility];  Service: Gynecology;  Laterality: N/A;  . ENDOMETRIAL BIOPSY    . HYSTEROSCOPY WITH RESECTOSCOPE  09/11/2010   Procedure: HYSTEROSCOPY WITH RESECTOSCOPE;  Surgeon: Michael LitterNaima A Dillard, MD;  Location: [facility];  Service: Gynecology;  Laterality: N/A;  . LEEP  1994  . TONSILLECTOMY      Current Medications: Current Meds  Medication Sig  . aspirin 81 MG tablet Take 81 mg by mouth daily.  . Calcium Carb-Cholecalciferol (CALCIUM-VITAMIN D) 500-400 MG-UNIT TABS Take by mouth daily.  . Flaxseed, Linseed, (FLAX SEED OIL) 1000 MG  CAPS Take by mouth daily.  . fluticasone (FLONASE) 50 MCG/ACT nasal spray instill 2 sprays into each nostril at bedtime  . losartan-hydrochlorothiazide (HYZAAR) 100-25 MG tablet Take 1 tablet by mouth daily.  . meloxicam (MOBIC) 15 MG tablet TAKE 1/2 TO 1 TABLET BY MOUTH EVERY DAY  . sertraline (ZOLOFT) 100 MG tablet 1 1/2 po qd  . simvastatin (ZOCOR) 40 MG tablet TAKE 1 TABLET(40 MG) BY MOUTH AT BEDTIME  . VITAMIN A PO Take 3,000 mg by mouth daily.  . vitamin C (ASCORBIC ACID) 250 MG tablet Take 250 mg by mouth daily.     Allergies:   Almond oil and Other   Social History   Socioeconomic History  . Marital status: Married    Spouse name: Not on file  . Number of children: Not on file  . Years of education: Not on file  . Highest education level: Not on file  Occupational History  . Not on file  Tobacco Use  . Smoking status: Never Smoker  . Smokeless tobacco: Never Used  Substance and Sexual Activity  . Alcohol use: Yes    Comment: occasionally  . Drug use: No  . Sexual activity: Yes    Partners: Male  Other Topics Concern  . Not on file  Social History Narrative   Exercise-- just walking up and  down the stairs at home   Social Determinants of Health   Financial Resource Strain:   . Difficulty of Paying Living Expenses:   Food Insecurity:   . Worried About Programme researcher, broadcasting/film/video in the Last Year:   . Barista in the Last Year:   Transportation Needs:   . Freight forwarder (Medical):   [name] Lack of Transportation (Non-Medical):   Physical Activity:   . Days of Exercise per Week:   . Minutes of Exercise per Session:   Stress:   . Feeling of Stress :   Social Connections:   . Frequency of Communication with Friends and Family:   . Frequency of Social Gatherings with Friends and Family:   . Attends Religious Services:   . Active Member of Clubs or Organizations:   . Attends Banker Meetings:   [name] Marital Status:      Family History: The  patient's family history includes Breast cancer in her mother; Heart attack in an other family member; Lymphoma in an other family member. There is no history of Colon cancer.  ROS:   Review of Systems  Constitution: Negative for decreased appetite, fever and weight gain.  HENT: Negative for congestion, ear discharge, hoarse voice and sore throat.   Eyes: Negative for discharge, redness, vision loss in right eye and visual halos.  Cardiovascular: Negative for chest pain, dyspnea on exertion, leg swelling, orthopnea and palpitations.  Respiratory: Negative for cough, hemoptysis, shortness of breath and snoring.   Endocrine: Negative for heat intolerance and polyphagia.  Hematologic/Lymphatic: Negative for bleeding problem. Does not bruise/bleed easily.  Skin: Negative for flushing, nail changes, rash and suspicious lesions.  Musculoskeletal: Negative for arthritis, joint pain, muscle cramps, myalgias, neck pain and stiffness.  Gastrointestinal: Negative for abdominal pain, bowel incontinence, diarrhea and excessive appetite.  Genitourinary: Negative for decreased libido, genital sores and incomplete emptying.  Neurological: Negative for brief paralysis, focal weakness, headaches and loss of balance.  Psychiatric/Behavioral: Negative for altered mental status, depression and suicidal ideas.  Allergic/Immunologic: Negative for HIV exposure and persistent infections.    EKGs/Labs/Other Studies Reviewed:    The following studies were reviewed today:   EKG:  The ekg ordered today demonstrates   Pharmacologic nuclear stress test:  The left ventricular ejection fraction is hyperdynamic (>65%).  Nuclear stress EF: 73%.  There was no ST segment deviation noted during stress.  No T wave inversion was noted during stress.  The study is normal.  This is a low risk study.  1. Normal myocardial perfusion imaging study without evidence of ischemia or infarction.  2. Patient developed a  rate-related LBBB that resolved (developed ~90 bpm).  3. Normal LVEF, >65%.  4. Low-risk study.    Transthoracic echocardiogram 02/01/2019 IMPRESSIONS    1. Left ventricular ejection fraction, by visual estimation, is 60 to  65%. The left ventricle has normal function. There is no left ventricular  hypertrophy.  2. The left ventricle has no regional wall motion abnormalities.  3. 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   FINDINGS  Left Ventricle: Left ventricular ejection fraction, by visual estimation,  is 60 to 65%. The left ventricle has normal function. The left ventricle  has no regional wall motion abnormalities. There is no left ventricular  hypertrophy. Normal left atrial  pressure.   Right Ventricle: The right ventricular size is normal. No increase in  right ventricular wall thickness. Global RV systolic function is has  normal systolic function. The tricuspid regurgitant velocity is 2.13 m/s,  and with an assumed right atrial pressure  of 3 mmHg, the estimated right ventricular systolic pressure is normal at  21.1 mmHg.   Left Atrium: Left atrial size was normal in size.   Right Atrium: Right atrial size was normal in size   Pericardium: There is no evidence of pericardial effusion.   Mitral Valve: The mitral valve is normal in structure. No evidence of  mitral valve regurgitation. No evidence of mitral valve stenosis by  observation.   Tricuspid Valve: The tricuspid valve is normal in structure. Tricuspid  valve regurgitation is mild.   Aortic Valve: The aortic valve is normal in structure. Aortic valve  regurgitation is not visualized. The aortic valve is structurally normal,  with no evidence of sclerosis or stenosis.   Pulmonic Valve: The pulmonic valve was normal in structure. Pulmonic valve  regurgitation is not visualized. Pulmonic regurgitation is not visualized.   Aorta: The aortic root, ascending aorta and  aortic arch are all  structurally normal, with no evidence of dilitation or obstruction. 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   Venous: The inferior vena cava is normal in size with greater than 50%  respiratory variability, suggesting right atrial pressure of 3 mmHg.   IAS/Shunts: No atrial level shunt detected by color flow Doppler. There is  no evidence of a patent foramen ovale. No ventricular septal defect is  seen or detected. There is no evidence of an atrial septal defect.    Recent Labs: 01/25/2019: Hemoglobin 15.4; Platelets 250.0; TSH 3.50 05/02/2019: ALT 19; BUN 23; Creatinine, Ser 0.68; Potassium 4.1; Sodium 139  Recent Lipid Panel    Component Value Date/Time   CHOL 160 05/02/2019 1428   TRIG 85.0 05/02/2019 1428   HDL 59.70 05/02/2019 1428   CHOLHDL 3 05/02/2019 1428   VLDL 17.0 05/02/2019 1428   LDLCALC 84 05/02/2019 1428    Physical Exam:    VS:  BP 130/88 (BP Location: Left Arm, Patient Position: Sitting, Cuff Size: Normal)   Pulse 71   Ht 5\' 5"  (1.651 m)   Wt 141 lb (64 kg)   SpO2 99%   BMI 23.46 kg/m     Wt Readings from Last 3 Encounters:  05/05/19 141 lb (64 kg)  05/02/19 145 lb 4 oz (65.9 kg)  03/22/19 148 lb (67.1 kg)     GEN: Well nourished, well developed in no acute distress HEENT: Normal NECK: No JVD; No carotid bruits LYMPHATICS: No lymphadenopathy CARDIAC: S1S2 noted,RRR, no murmurs, rubs, gallops RESPIRATORY:  Clear to auscultation without rales, wheezing or rhonchi  ABDOMEN: Soft, non-tender, non-distended, +bowel sounds, no guarding. EXTREMITIES: No edema, No cyanosis, no clubbing MUSCULOSKELETAL:  No deformity  SKIN: Warm and dry NEUROLOGIC:  Alert and oriented x 3, non-focal PSYCHIATRIC:  Normal affect, good insight  ASSESSMENT:    1. Essential hypertension    PLAN:     1.  Her blood pressure susceptible in the office today.  No changes will be made to her current antihypertensive  regimen.  The patient is in agreement with the above plan. The patient left the office in stable condition.  The patient will follow up in 1 year or sooner if needed.   Medication Adjustments/Labs and Tests Ordered: Current medicines are reviewed at length with the patient today.  Concerns regarding medicines are outlined above.  No orders of the defined types were placed in this encounter.  No orders of the defined types were placed in this encounter.   Patient Instructions  Medication Instructions:  Your physician recommends that you continue on your current medications as directed. Please refer to the Current Medication list given to you today.  *If you need a refill on your cardiac medications before your next appointment, please call your pharmacy*   Lab Work: None ordered  If you have labs (blood work) drawn today and your tests are completely normal, you will receive your results only by: [name] MyChart Message (if you have MyChart) OR . A paper copy in the mail If you have any lab test that is abnormal or we need to change your treatment, we will call you to review the results.   Testing/Procedures: None ordered   Follow-Up: At [facility], you and your health needs are our priority.  As part of our continuing mission to provide you with exceptional heart care, we have created designated Provider Care Teams.  These Care Teams include your primary Cardiologist (physician) and Advanced Practice Providers (APPs -  Physician Assistants and Nurse Practitioners) who all work together to provide you with the care you need, when you need it.  We recommend signing up for the patient portal called "MyChart".  Sign up information is provided on this After Visit Summary.  MyChart is used to connect with patients for Virtual Visits (Telemedicine).  Patients are able to view lab/test results, encounter notes, upcoming appointments, etc.  Non-urgent messages can be sent to your provider as  well.   To learn more about what you can do with MyChart, go to [email or web address].    Your next appointment:   2 month(s)  The format for your next appointment:   In Person  Provider:   Berniece Salines, DO   Other Instructions      Adopting a Healthy Lifestyle.  Know what a healthy weight is for you (roughly BMI <25) and aim to maintain this   Aim for 7+ servings of fruits and vegetables daily   65-80+ fluid ounces of water or unsweet tea for healthy kidneys   Limit to max 1 drink of alcohol per day; avoid smoking/tobacco   Limit animal fats in diet for cholesterol and heart health - choose grass fed whenever available   Avoid highly processed foods, and foods high in saturated/trans fats   Aim for low stress - take time to unwind and care for your mental health   Aim for 150 min of moderate intensity exercise weekly for heart health, and weights twice weekly for bone health   Aim for 7-9 hours of sleep daily   When it comes to diets, agreement about the perfect plan isnt easy to find, even among the experts. Experts at the [employer] developed an idea known as the Healthy Eating Plate. Just imagine a plate divided into logical, healthy portions.   The emphasis is on diet quality:   Load up on vegetables and fruits - one-half of your plate: Aim for color and variety, and remember that potatoes dont count.   Go for whole grains - one-quarter of your plate: Whole wheat, barley, wheat berries, quinoa, oats, brown rice, and foods made with them. If you want pasta, go with whole wheat pasta.   Protein power - one-quarter of your plate: Fish, chicken, beans, and nuts are all healthy, versatile protein sources. Limit red meat.   The diet, however, does go beyond the plate, offering a few other suggestions.  Use healthy plant oils, such as olive, canola, soy, corn, sunflower and peanut. Check the labels, and avoid partially hydrogenated oil, which  have unhealthy trans fats.   If youre thirsty, drink water. Coffee and tea are good in moderation, but skip sugary drinks and limit milk and dairy products to one or two daily servings.   The type of carbohydrate in the diet is more important than the amount. Some sources of carbohydrates, such as vegetables, fruits, whole grains, and beans-are healthier than others.   Finally, stay active  Signed, Thomasene Ripple, DO  05/05/2019 3:23 PM    [facility]

## 2019-05-26 ENCOUNTER — Encounter: Payer: Medicare Other | Admitting: Physical Medicine and Rehabilitation

## 2019-06-04 ENCOUNTER — Encounter: Payer: Self-pay | Admitting: Cardiology

## 2019-07-05 ENCOUNTER — Ambulatory Visit: Payer: Medicare Other | Admitting: Cardiology

## 2019-07-06 ENCOUNTER — Ambulatory Visit: Payer: Medicare Other | Admitting: Cardiology

## 2019-08-18 ENCOUNTER — Other Ambulatory Visit: Payer: Self-pay | Admitting: Family Medicine

## 2019-08-18 DIAGNOSIS — E785 Hyperlipidemia, unspecified: Secondary | ICD-10-CM

## 2019-10-28 ENCOUNTER — Other Ambulatory Visit: Payer: Self-pay

## 2019-10-28 ENCOUNTER — Ambulatory Visit (INDEPENDENT_AMBULATORY_CARE_PROVIDER_SITE_OTHER): Payer: Medicare Other

## 2019-10-28 DIAGNOSIS — Z23 Encounter for immunization: Secondary | ICD-10-CM

## 2019-10-28 NOTE — Progress Notes (Signed)
   Covid-19 Vaccination Clinic  Name:  ALBIRTA RHINEHART    MRN: [id number] DOB: April 15, 1945  10/28/2019  Ms. Natzke was observed post Covid-19 immunization for 15 minutes without incident. She was provided with Vaccine Information Sheet and instruction to access the V-Safe system.   Ms. Aldape was instructed to call 911 with any severe reactions post vaccine: [name] Difficulty breathing  . Swelling of face and throat  . A fast heartbeat  . A bad rash all over body  . Dizziness and weakness   Immunizations Administered    Name Date Dose VIS Date Route   Pfizer COVID-19 Vaccine 10/28/2019 11:10 AM 0.3 mL 04/06/2018 Intramuscular   Manufacturer: [employer], [employer]   Lot: [id number]   NDC: [address]-6     Co-observed with Diminique Pricilla Loveless, CMA. Andree Coss, RN

## 2019-10-28 NOTE — Progress Notes (Signed)
° °  Covid-19 Vaccination Clinic  Name:  ARLETTA LUMADUE    MRN: [id number] DOB: 1945-12-07  10/28/2019  Ms. Castelli was observed post Covid-19 immunization for 15 minutes without incident. She was provided with Vaccine Information Sheet and instruction to access the V-Safe system.   Ms. Welshans was instructed to call 911 with any severe reactions post vaccine:  Difficulty breathing   Swelling of face and throat   A fast heartbeat   A bad rash all over body   Dizziness and weakness   Immunizations Administered    Name Date Dose VIS Date Route   Pfizer COVID-19 Vaccine 10/28/2019 11:10 AM 0.3 mL 04/06/2018 Intramuscular   Manufacturer: [employer], [employer]   Lot: [id number]   NDC: [address]-1    Omarii Scalzo T Pricilla Loveless

## 2019-11-03 ENCOUNTER — Encounter: Payer: Medicare Other | Admitting: Family Medicine

## 2019-11-06 ENCOUNTER — Other Ambulatory Visit: Payer: Self-pay | Admitting: Family Medicine

## 2019-11-06 DIAGNOSIS — E785 Hyperlipidemia, unspecified: Secondary | ICD-10-CM

## 2020-01-13 ENCOUNTER — Other Ambulatory Visit: Payer: Self-pay | Admitting: Family Medicine

## 2020-01-13 DIAGNOSIS — E785 Hyperlipidemia, unspecified: Secondary | ICD-10-CM

## 2020-02-10 ENCOUNTER — Other Ambulatory Visit: Payer: Self-pay | Admitting: Family Medicine

## 2020-02-10 DIAGNOSIS — I10 Essential (primary) hypertension: Secondary | ICD-10-CM

## 2020-04-13 ENCOUNTER — Ambulatory Visit: Payer: Medicare Other | Admitting: Family Medicine

## 2020-04-13 ENCOUNTER — Encounter: Payer: Self-pay | Admitting: Family Medicine

## 2020-04-13 ENCOUNTER — Other Ambulatory Visit: Payer: Self-pay

## 2020-04-13 VITALS — BP 136/86 | HR 63 | Temp 98.4°F | Resp 16 | Ht 65.0 in | Wt 136.2 lb

## 2020-04-13 DIAGNOSIS — Z23 Encounter for immunization: Secondary | ICD-10-CM | POA: Diagnosis not present

## 2020-04-13 DIAGNOSIS — I10 Essential (primary) hypertension: Secondary | ICD-10-CM

## 2020-04-13 DIAGNOSIS — F5105 Insomnia due to other mental disorder: Secondary | ICD-10-CM | POA: Diagnosis not present

## 2020-04-13 DIAGNOSIS — F99 Mental disorder, not otherwise specified: Secondary | ICD-10-CM

## 2020-04-13 DIAGNOSIS — F418 Other specified anxiety disorders: Secondary | ICD-10-CM

## 2020-04-13 DIAGNOSIS — E785 Hyperlipidemia, unspecified: Secondary | ICD-10-CM | POA: Diagnosis not present

## 2020-04-13 HISTORY — DX: Other specified anxiety disorders: F41.8

## 2020-04-13 LAB — LIPID PANEL
Cholesterol: 197 mg/dL (ref 0–200)
HDL: 79.5 mg/dL (ref >39.00)
LDL Cholesterol: 102 mg/dL — ABNORMAL HIGH (ref 0–99)
NonHDL: 117.16
Total CHOL/HDL Ratio: 2
Triglycerides: 76 mg/dL (ref 0.0–149.0)
VLDL: 15.2 mg/dL (ref 0.0–40.0)

## 2020-04-13 LAB — COMPREHENSIVE METABOLIC PANEL
ALT: 23 U/L (ref 0–35)
AST: 21 U/L (ref 0–37)
Albumin: 4.4 g/dL (ref 3.5–5.2)
Alkaline Phosphatase: 56 U/L (ref 39–117)
BUN: 21 mg/dL (ref 6–23)
CO2: 29 mEq/L (ref 19–32)
Calcium: 9.9 mg/dL (ref 8.4–10.5)
Chloride: 102 mEq/L (ref 96–112)
Creatinine, Ser: 0.75 mg/dL (ref 0.40–1.20)
GFR: 77.99 mL/min (ref >60.00)
Glucose, Bld: 93 mg/dL (ref 70–99)
Potassium: 4.3 mEq/L (ref 3.5–5.1)
Sodium: 141 mEq/L (ref 135–145)
Total Bilirubin: 0.8 mg/dL (ref 0.2–1.2)
Total Protein: 7 g/dL (ref 6.0–8.3)

## 2020-04-13 MED ORDER — SIMVASTATIN 40 MG PO TABS: ORAL_TABLET | ORAL | 1 refills | Status: DC

## 2020-04-13 MED ORDER — LOSARTAN POTASSIUM-HCTZ 100-25 MG PO TABS: 1.0000 | ORAL_TABLET | Freq: Every day | ORAL | 1 refills | Status: DC

## 2020-04-13 MED ORDER — TRAZODONE HCL 50 MG PO TABS: 25.0000 mg | ORAL_TABLET | Freq: Every evening | ORAL | 3 refills | Status: DC | PRN

## 2020-04-13 NOTE — Patient Instructions (Signed)

## 2020-04-13 NOTE — Assessment & Plan Note (Signed)
Worsening due to husbands health con't zoloft trazadone added for sleep prn F/u 3-6 months or sooner prn

## 2020-04-13 NOTE — Assessment & Plan Note (Signed)
Well controlled, no changes to meds. Encouraged heart healthy diet such as the DASH diet and exercise as tolerated.  °

## 2020-04-13 NOTE — Progress Notes (Signed)
Patient ID: Glenda Lyons, female    DOB: 05/04/1945  Age: 75 y.o. MRN: [id number]    Subjective:  Subjective  HPI Glenda Lyons presents  today for office visit. She addresses health concerns about her husband during this visit. She complains of developing sleep disturbance, anxiety, high bp, and muscle tension secondary to her husband's recent prognosis. She notes that "getting to sleep is the most difficult task." She reports that she responded well to Valium in the past. She only request for medication refills at this time. She notes that she is tolerating her other medications well. She has no complaint at this time. She denies any chest pain, N/V/D, fever, cough, SOB, abdominal pain, dizziness, or HA.  Review of Systems  Constitutional: Negative for activity change, appetite change, fatigue and unexpected weight change.  Respiratory: Negative for cough and shortness of breath.   Cardiovascular: Negative for chest pain and palpitations.  Psychiatric/Behavioral: Positive for sleep disturbance. Negative for behavioral problems and dysphoric mood. The patient is nervous/anxious.     History Past Medical History:  Diagnosis Date  . Allergy    seasonal  . Depression   . Herniated disc   . Hyperlipemia   . Hypertension   . Neuromuscular disorder (HCC)   . Osteoarthritis   . Sciatica     She has a past surgical history that includes Tonsillectomy; Biopsy breast; LEEP (1994); Hysteroscopy with resectoscope (09/11/2010); Dilation and curettage of uterus (09/11/2010); Endometrial biopsy; Cystectomy; and Breast biopsy (Right).   Her family history includes Breast cancer in her mother; Heart attack in an other family member; Lymphoma in an other family member.She reports that she has never smoked. She has never used smokeless tobacco. She reports current alcohol use. She reports that she does not use drugs.  Current Outpatient Medications on File Prior to Visit  Medication Sig Dispense  Refill  . aspirin 81 MG tablet Take 81 mg by mouth daily.    . Calcium Carb-Cholecalciferol (CALCIUM-VITAMIN D) 500-400 MG-UNIT TABS Take by mouth daily.    . Flaxseed, Linseed, (FLAX SEED OIL) 1000 MG CAPS Take by mouth daily.    . fluticasone (FLONASE) 50 MCG/ACT nasal spray instill 2 sprays into each nostril at bedtime 16 g 2  . meloxicam (MOBIC) 15 MG tablet TAKE 1/2 TO 1 TABLET BY MOUTH EVERY DAY 90 tablet 0  . sertraline (ZOLOFT) 100 MG tablet 1 1/2 po qd 135 tablet 3  . VITAMIN A PO Take 3,000 mg by mouth daily.    . vitamin C (ASCORBIC ACID) 250 MG tablet Take 250 mg by mouth daily.     No current facility-administered medications on file prior to visit.     Objective:  Objective  Physical Exam Vitals and nursing note reviewed.  Constitutional:      Appearance: She is well-developed and well-nourished.  HENT:     Head: Normocephalic and atraumatic.  Eyes:     Extraocular Movements: EOM normal.     Conjunctiva/sclera: Conjunctivae normal.  Neck:     Thyroid: No thyromegaly.     Vascular: No carotid bruit or JVD.  Cardiovascular:     Rate and Rhythm: Normal rate and regular rhythm.     Heart sounds: Normal heart sounds. No murmur heard.   Pulmonary:     Effort: Pulmonary effort is normal. No respiratory distress.     Breath sounds: Normal breath sounds. No wheezing or rales.  Chest:     Chest wall: No tenderness.  Musculoskeletal:  General: No edema.     Cervical back: Normal range of motion and neck supple.  Neurological:     Mental Status: She is alert and oriented to person, place, and time.  Psychiatric:        Attention and Perception: Attention and perception normal.        Mood and Affect: Mood is anxious. Mood is not depressed or elated. Affect is flat. Affect is not labile, blunt, angry, tearful or inappropriate.        Speech: Speech normal.        Behavior: Behavior normal.        Thought Content: Thought content normal.        Cognition and  Memory: Cognition is not impaired. Memory is not impaired. She does not exhibit impaired recent memory or impaired remote memory.    BP 136/86 (BP Location: Right Arm, Patient Position: Sitting, Cuff Size: Normal)   Pulse 63   Temp 98.4 F (36.9 C) (Oral)   Resp 16   Ht 5\' 5"  (1.651 m)   Wt 136 lb 3.2 oz (61.8 kg)   SpO2 99%   BMI 22.66 kg/m  Wt Readings from Last 3 Encounters:  04/13/20 136 lb 3.2 oz (61.8 kg)  05/05/19 141 lb (64 kg)  05/02/19 145 lb 4 oz (65.9 kg)     Lab Results  Component Value Date   WBC 8.5 01/25/2019   HGB 15.4 (H) 01/25/2019   HCT 46.5 (H) 01/25/2019   PLT 250.0 01/25/2019   GLUCOSE 103 (H) 05/02/2019   CHOL 160 05/02/2019   TRIG 85.0 05/02/2019   HDL 59.70 05/02/2019   LDLCALC 84 05/02/2019   ALT 19 05/02/2019   AST 21 05/02/2019   NA 139 05/02/2019   K 4.1 05/02/2019   CL 104 05/02/2019   CREATININE 0.68 05/02/2019   BUN 23 05/02/2019   CO2 29 05/02/2019   TSH 3.50 01/25/2019   HGBA1C 6.1 (H) 12/15/2005    MM 3D SCREEN BREAST BILATERAL  Result Date: 04/05/2019 CLINICAL DATA:  Screening. EXAM: DIGITAL SCREENING BILATERAL MAMMOGRAM WITH TOMO AND CAD COMPARISON:  Previous exam(s). ACR Breast Density Category c: The breast tissue is heterogeneously dense, which may obscure small masses. FINDINGS: There are no findings suspicious for malignancy. Images were processed with CAD. IMPRESSION: No mammographic evidence of malignancy. A result letter of this screening mammogram will be mailed directly to the patient. RECOMMENDATION: Screening mammogram in one year. (Code:SM-B-01Y) BI-RADS CATEGORY  1: Negative. Electronically Signed   By: 04/07/2019 M.D.   On: 04/05/2019 15:50     Assessment & Plan:  Plan  I have changed 04/07/2019. [place] "Marie"'s simvastatin. I am also having her start on traZODone. Additionally, I am having her maintain her fluticasone, aspirin, vitamin C, Calcium-Vitamin D, Flax Seed Oil, meloxicam, VITAMIN A PO, sertraline, and  losartan-hydrochlorothiazide.  Meds ordered this encounter  Medications  . losartan-hydrochlorothiazide (HYZAAR) 100-25 MG tablet    Sig: Take 1 tablet by mouth daily.    Dispense:  90 tablet    Refill:  1  . simvastatin (ZOCOR) 40 MG tablet    Sig: Take 1 tablet (40 mg total) by mouth at bedtime.    Dispense:  30 tablet    Refill:  1  . traZODone (DESYREL) 50 MG tablet    Sig: Take 0.5-1 tablets (25-50 mg total) by mouth at bedtime as needed for sleep.    Dispense:  30 tablet    Refill:  3  Problem List Items Addressed This Visit      Unprioritized   Depression with anxiety    Worsening due to husbands health con't zoloft trazadone added for sleep prn F/u 3-6 months or sooner prn       Relevant Medications   traZODone (DESYREL) 50 MG tablet   Essential hypertension    Well controlled, no changes to meds. Encouraged heart healthy diet such as the DASH diet and exercise as tolerated.       Relevant Medications   losartan-hydrochlorothiazide (HYZAAR) 100-25 MG tablet   simvastatin (ZOCOR) 40 MG tablet   Other Relevant Orders   Lipid panel   Comprehensive metabolic panel   Hyperlipidemia    Tolerating statin, encouraged heart healthy diet, avoid trans fats, minimize simple carbs and saturated fats. Increase exercise as tolerated      Relevant Medications   losartan-hydrochlorothiazide (HYZAAR) 100-25 MG tablet   simvastatin (ZOCOR) 40 MG tablet   Other Relevant Orders   Lipid panel   Comprehensive metabolic panel    Other Visit Diagnoses    Need for influenza vaccination    -  Primary   Relevant Orders   Flu Vaccine QUAD High Dose(Fluad) (Completed)   Insomnia due to other mental disorder       Relevant Medications   traZODone (DESYREL) 50 MG tablet      Follow-up: Return in about 6 months (around 10/14/2020), or if symptoms worsen or fail to improve, for fasting, annual exam.  Donato Schultz, DO

## 2020-04-13 NOTE — Assessment & Plan Note (Signed)
Tolerating statin, encouraged heart healthy diet, avoid trans fats, minimize simple carbs and saturated fats. Increase exercise as tolerated 

## 2020-05-29 DIAGNOSIS — G709 Myoneural disorder, unspecified: Secondary | ICD-10-CM | POA: Insufficient documentation

## 2020-05-29 DIAGNOSIS — F32A Depression, unspecified: Secondary | ICD-10-CM | POA: Insufficient documentation

## 2020-05-29 DIAGNOSIS — T7840XA Allergy, unspecified, initial encounter: Secondary | ICD-10-CM | POA: Insufficient documentation

## 2020-05-29 DIAGNOSIS — E785 Hyperlipidemia, unspecified: Secondary | ICD-10-CM | POA: Insufficient documentation

## 2020-05-29 DIAGNOSIS — M5430 Sciatica, unspecified side: Secondary | ICD-10-CM | POA: Insufficient documentation

## 2020-05-29 DIAGNOSIS — I10 Essential (primary) hypertension: Secondary | ICD-10-CM | POA: Insufficient documentation

## 2020-05-29 DIAGNOSIS — M1990 Unspecified osteoarthritis, unspecified site: Secondary | ICD-10-CM | POA: Insufficient documentation

## 2020-06-01 ENCOUNTER — Other Ambulatory Visit: Payer: Self-pay | Admitting: Family Medicine

## 2020-06-01 ENCOUNTER — Ambulatory Visit: Payer: Medicare Other | Admitting: Cardiology

## 2020-06-01 DIAGNOSIS — Z1231 Encounter for screening mammogram for malignant neoplasm of breast: Secondary | ICD-10-CM

## 2020-06-19 ENCOUNTER — Other Ambulatory Visit: Payer: Self-pay | Admitting: Family Medicine

## 2020-06-19 DIAGNOSIS — E785 Hyperlipidemia, unspecified: Secondary | ICD-10-CM

## 2020-06-25 ENCOUNTER — Other Ambulatory Visit: Payer: Self-pay | Admitting: Family Medicine

## 2020-06-25 DIAGNOSIS — F320 Major depressive disorder, single episode, mild: Secondary | ICD-10-CM

## 2020-06-29 ENCOUNTER — Encounter: Payer: Self-pay | Admitting: Cardiology

## 2020-06-29 ENCOUNTER — Ambulatory Visit (INDEPENDENT_AMBULATORY_CARE_PROVIDER_SITE_OTHER): Payer: Medicare Other | Admitting: Cardiology

## 2020-06-29 ENCOUNTER — Other Ambulatory Visit: Payer: Self-pay

## 2020-06-29 VITALS — BP 142/86 | HR 77 | Ht 65.0 in | Wt 135.0 lb

## 2020-06-29 DIAGNOSIS — I10 Essential (primary) hypertension: Secondary | ICD-10-CM | POA: Diagnosis not present

## 2020-06-29 DIAGNOSIS — E782 Mixed hyperlipidemia: Secondary | ICD-10-CM | POA: Diagnosis not present

## 2020-06-29 NOTE — Patient Instructions (Signed)

## 2020-06-29 NOTE — Progress Notes (Signed)
Cardiology Office Note:    Date:  06/29/2020   ID:  Glenda Lyons, DOB 07/31/1945, MRN [id number]  PCP:  Zola Button, Grayling Congress, DO  Cardiologist:  Thomasene Ripple, DO  Electrophysiologist:  None   Referring MD: Zola Button, Grayling Congress, *   " I am under a lot of stress because of my husband illness"  History of Present Illness:    Glenda Lyons is a 75 y.o. female with a hx of hypertension, left bundle branch block is here today for follow-up visit.  I last saw the patient on March 2021 at that time she appeared to be doing well from a cardiovascular standpoint. Today she tells me she is doing well however her husband illness is worsening he is got some infections that is being treated.  Past Medical History:  Diagnosis Date  . Acute bacterial sinusitis 12/26/2014  . Allergy    seasonal  . Bilateral conjunctivitis 12/26/2014  . BURSITIS, RIGHT HIP 07/10/2006   Qualifier: Diagnosis of  By: Janit Bern    . Chronic midline low back pain without sciatica 05/02/2019  . COLONIC POLYPS, HX OF 03/13/2010   Qualifier: Diagnosis of  By: Janit Bern    . Depression   . Depression with anxiety 04/13/2020  . Depression, major, single episode, mild (HCC) 07/10/2006   Qualifier: Diagnosis of  By: Janit Bern    . Dysuria 06/14/2010  . Esodeviation 12/19/2010  . Essential hypertension 07/10/2006   Qualifier: Diagnosis of  By: Janit Bern    . 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 Herniated disc   . HERNIATED DISC 07/10/2006   Qualifier: Diagnosis of  By: Janit Bern    . Hyperlipemia   . Hyperlipidemia 06/03/2007   Qualifier: Diagnosis of  By: Janit Bern    . Hypertension   . 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 Neuromuscular disorder (HCC)   . New onset left bundle branch block (LBBB) 01/25/2019  . Osteoarthritis   . OSTEOARTHRITIS 06/03/2007   Qualifier: Diagnosis of  By: Janit Bern    . Palpitation 01/25/2019  . POSTMENOPAUSAL STATUS 12/29/2006    Qualifier: Diagnosis of  By: Janit Bern    . Sciatica   . Seborrheic keratoses 07/08/2012  . Skin laxity 08/20/2013  . 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 Status post lumbar spinal fusion 12/21/2014  . 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 UNSPECIFIED PERIPHERAL VASCULAR DISEASE 12/29/2006   Qualifier: Diagnosis of  By: Janit Bern      Past Surgical History:  Procedure Laterality Date  . BIOPSY BREAST    . BREAST BIOPSY Right   . CYSTECTOMY     leg  . DILATION AND CURETTAGE OF UTERUS  09/11/2010   Procedure: DILATATION AND CURETTAGE (D&C);  Surgeon: Michael Litter, MD;  Location: [facility];  Service: Gynecology;  Laterality: N/A;  . ENDOMETRIAL BIOPSY    . HYSTEROSCOPY WITH RESECTOSCOPE  09/11/2010   Procedure: HYSTEROSCOPY WITH RESECTOSCOPE;  Surgeon: Michael Litter, MD;  Location: [facility];  Service: Gynecology;  Laterality: N/A;  . LEEP  1994  . TONSILLECTOMY      Current Medications: Current Meds  Medication Sig  . aspirin 81 MG tablet Take 81 mg by mouth daily.  . Calcium Carb-Cholecalciferol (CALCIUM-VITAMIN D) 500-400 MG-UNIT TABS Take by mouth daily.  . Flaxseed, Linseed, (FLAX SEED OIL) 1000 MG CAPS Take by mouth daily.  [name]  fluticasone (FLONASE) 50 MCG/ACT nasal spray instill 2 sprays into each nostril at bedtime  . losartan-hydrochlorothiazide (HYZAAR) 100-25 MG tablet Take 1 tablet by mouth daily.  . meloxicam (MOBIC) 15 MG tablet TAKE 1/2 TO 1 TABLET BY MOUTH EVERY DAY  . sertraline (ZOLOFT) 100 MG tablet Take 1.5 tablets (150 mg total) by mouth daily.  . simvastatin (ZOCOR) 40 MG tablet Take 1 tablet (40 mg total) by mouth at bedtime.  [name] VITAMIN A PO Take 3,000 mg by mouth daily.  . vitamin C (ASCORBIC ACID) 250 MG tablet Take 250 mg by mouth daily.  . [DISCONTINUED] traZODone (DESYREL) 50 MG tablet Take 0.5-1 tablets (25-50 mg total) by mouth at bedtime as needed for sleep.     Allergies:   Almond oil and Other   Social  History   Socioeconomic History  . Marital status: Married    Spouse name: Not on file  . Number of children: Not on file  . Years of education: Not on file  . Highest education level: Not on file  Occupational History  . Not on file  Tobacco Use  . Smoking status: Never Smoker  . Smokeless tobacco: Never Used  Substance and Sexual Activity  . Alcohol use: Yes    Comment: occasionally  . Drug use: No  . Sexual activity: Yes    Partners: Male  Other Topics Concern  . Not on file  Social History Narrative   Exercise-- just walking up and down the stairs at home   Social Determinants of Health   Financial Resource Strain: Not on file  Food Insecurity: Not on file  Transportation Needs: Not on file  Physical Activity: Not on file  Stress: Not on file  Social Connections: Not on file     Family History: The patient's family history includes Breast cancer in her mother; Heart attack in an other family member; Lymphoma in an other family member. There is no history of Colon cancer.  ROS:   Review of Systems  Constitution: Negative for decreased appetite, fever and weight gain.  HENT: Negative for congestion, ear discharge, hoarse voice and sore throat.   Eyes: Negative for discharge, redness, vision loss in right eye and visual halos.  Cardiovascular: Negative for chest pain, dyspnea on exertion, leg swelling, orthopnea and palpitations.  Respiratory: Negative for cough, hemoptysis, shortness of breath and snoring.   Endocrine: Negative for heat intolerance and polyphagia.  Hematologic/Lymphatic: Negative for bleeding problem. Does not bruise/bleed easily.  Skin: Negative for flushing, nail changes, rash and suspicious lesions.  Musculoskeletal: Negative for arthritis, joint pain, muscle cramps, myalgias, neck pain and stiffness.  Gastrointestinal: Negative for abdominal pain, bowel incontinence, diarrhea and excessive appetite.  Genitourinary: Negative for decreased libido,  genital sores and incomplete emptying.  Neurological: Negative for brief paralysis, focal weakness, headaches and loss of balance.  Psychiatric/Behavioral: Negative for altered mental status, depression and suicidal ideas.  Allergic/Immunologic: Negative for HIV exposure and persistent infections.    EKGs/Labs/Other Studies Reviewed:    The following studies were reviewed today:   EKG: None   Pharmacologic nuclear stress test:  The left ventricular ejection fraction is hyperdynamic (>65%).  Nuclear stress EF: 73%.  There was no ST segment deviation noted during stress.  No T wave inversion was noted during stress.  The study is normal.  This is a low risk study.  1. Normal myocardial perfusion imaging study without evidence of ischemia or infarction.  2. Patient developed a rate-related LBBB that resolved (  developed ~90 bpm).  3. Normal LVEF, >65%.  4. Low-risk study.   Transthoracic echocardiogram12/22/2020 IMPRESSIONS  1. Left ventricular ejection fraction, by visual estimation, is 60 to  65%. The left ventricle has normal function. There is no left ventricular  hypertrophy.  2. The left ventricle has no regional wall motion abnormalities.  3. 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   FINDINGS  Left Ventricle: Left ventricular ejection fraction, by visual estimation,  is 60 to 65%. The left ventricle has normal function. The left ventricle  has no regional wall motion abnormalities. There is no left ventricular  hypertrophy. Normal left atrial  pressure.   Right Ventricle: The right ventricular size is normal. No increase in  right ventricular wall thickness. Global RV systolic function is has  normal systolic function. The tricuspid regurgitant velocity is 2.13 m/s,  and with an assumed right atrial pressure  of 3 mmHg, the estimated right ventricular systolic pressure is normal at  21.1 mmHg.   Left Atrium: Left  atrial size was normal in size.   Right Atrium: Right atrial size was normal in size   Pericardium: There is no evidence of pericardial effusion.   Mitral Valve: The mitral valve is normal in structure. No evidence of  mitral valve regurgitation. No evidence of mitral valve stenosis by  observation.   Tricuspid Valve: The tricuspid valve is normal in structure. Tricuspid  valve regurgitation is mild.   Aortic Valve: The aortic valve is normal in structure. Aortic valve  regurgitation is not visualized. The aortic valve is structurally normal,  with no evidence of sclerosis or stenosis.   Pulmonic Valve: The pulmonic valve was normal in structure. Pulmonic valve  regurgitation is not visualized. Pulmonic regurgitation is not visualized.   Aorta: The aortic root, ascending aorta and aortic arch are all  structurally normal, with no evidence of dilitation or obstruction. 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   Venous: The inferior vena cava is normal in size with greater than 50%  respiratory variability, suggesting right atrial pressure of 3 mmHg.   IAS/Shunts: No atrial level shunt detected by color flow Doppler. There is  no evidence of a patent foramen ovale. No ventricular septal defect is  seen or detected. There is no evidence of an atrial septal defect  Recent Labs: 04/13/2020: ALT 23; BUN 21; Creatinine, Ser 0.75; Potassium 4.3; Sodium 141  Recent Lipid Panel    Component Value Date/Time   CHOL 197 04/13/2020 1109   TRIG 76.0 04/13/2020 1109   HDL 79.50 04/13/2020 1109   CHOLHDL 2 04/13/2020 1109   VLDL 15.2 04/13/2020 1109   LDLCALC 102 (H) 04/13/2020 1109    Physical Exam:    VS:  BP (!) 142/86   Pulse 77   Ht  (1.651 m)   Wt 135 lb (61.2 kg)   SpO2 98%   BMI 22.47 kg/m     Wt Readings from Last 3 Encounters:  06/29/20 135 lb (61.2 kg)  04/13/20 136 lb 3.2 oz (61.8 kg)  05/05/19 141 lb (64 kg)     GEN: Well  nourished, well developed in no acute distress HEENT: Normal NECK: No JVD; No carotid bruits LYMPHATICS: No lymphadenopathy CARDIAC: S1S2 noted,RRR, no murmurs, rubs, gallops RESPIRATORY:  Clear to auscultation without rales, wheezing or rhonchi  ABDOMEN: Soft, non-tender, non-distended, +bowel sounds, no guarding. EXTREMITIES: No edema, No cyanosis, no clubbing MUSCULOSKELETAL:  No deformity  SKIN: Warm and dry NEUROLOGIC:  Alert and oriented x 3, non-focal PSYCHIATRIC:  Normal affect, good insight  ASSESSMENT:    1. Hypertension, unspecified type   2. Mixed hyperlipidemia    PLAN:     She is doing well from a CV standpoint,  No changes to her medication regimen.   Blood pressure is acceptable, continue with current antihypertensive regimen. Hyperlipidemia - continue with current statin medication.  The patient is in agreement with the above plan. The patient left the office in stable condition.  The patient will follow up in   Medication Adjustments/Labs and Tests Ordered: Current medicines are reviewed at length with the patient today.  Concerns regarding medicines are outlined above.  Orders Placed This Encounter  Procedures  . EKG 12-Lead   No orders of the defined types were placed in this encounter.   Patient Instructions  Medication Instructions:  Your physician recommends that you continue on your current medications as directed. Please refer to the Current Medication list given to you today.  *If you need a refill on your cardiac medications before your next appointment, please call your pharmacy*   Lab Work: None If you have labs (blood work) drawn today and your tests are completely normal, you will receive your results only by: [name]. MyChart Message (if you have MyChart) OR . A paper copy in the mail If you have any lab test that is abnormal or we need to change your treatment, we will call you to review the  results.   Testing/Procedures: None   Follow-Up: At [facility], you and your health needs are our priority.  As part of our continuing mission to provide you with exceptional heart care, we have created designated Provider Care Teams.  These Care Teams include your primary Cardiologist (physician) and Advanced Practice Providers (APPs -  Physician Assistants and Nurse Practitioners) who all work together to provide you with the care you need, when you need it.  We recommend signing up for the patient portal called "MyChart".  Sign up information is provided on this After Visit Summary.  MyChart is used to connect with patients for Virtual Visits (Telemedicine).  Patients are able to view lab/test results, encounter notes, upcoming appointments, etc.  Non-urgent messages can be sent to your provider as well.   To learn more about what you can do with MyChart, go to [email or web address].    Your next appointment:   1 year(s)  The format for your next appointment:   In Person  Provider:   Thomasene RippleKardie Tacha Manni, DO   Other Instructions      Adopting a Healthy Lifestyle.  Know what a healthy weight is for you (roughly BMI <25) and aim to maintain this   Aim for 7+ servings of fruits and vegetables daily   65-80+ fluid ounces of water or unsweet tea for healthy kidneys   Limit to max 1 drink of alcohol per day; avoid smoking/tobacco   Limit animal fats in diet for cholesterol and heart health - choose grass fed whenever available   Avoid highly processed foods, and foods high in saturated/trans fats   Aim for low stress - take time to unwind and care for your mental health   Aim for 150 min of moderate intensity exercise weekly for heart health, and weights twice weekly for bone health   Aim for 7-9 hours of sleep daily   When it comes to diets, agreement about the perfect plan isnt easy to find, even among  the experts. Experts at the [facility] [employer] developed  an idea known as the Healthy Eating Plate. Just imagine a plate divided into logical, healthy portions.   The emphasis is on diet quality:   Load up on vegetables and fruits - one-half of your plate: Aim for color and variety, and remember that potatoes dont count.   Go for whole grains - one-quarter of your plate: Whole wheat, barley, wheat berries, quinoa, oats, brown rice, and foods made with them. If you want pasta, go with whole wheat pasta.   Protein power - one-quarter of your plate: Fish, chicken, beans, and nuts are all healthy, versatile protein sources. Limit red meat.   The diet, however, does go beyond the plate, offering a few other suggestions.   Use healthy plant oils, such as olive, canola, soy, corn, sunflower and peanut. Check the labels, and avoid partially hydrogenated oil, which have unhealthy trans fats.   If youre thirsty, drink water. Coffee and tea are good in moderation, but skip sugary drinks and limit milk and dairy products to one or two daily servings.   The type of carbohydrate in the diet is more important than the amount. Some sources of carbohydrates, such as vegetables, fruits, whole grains, and beans-are healthier than others.   Finally, stay active  Signed, Thomasene Ripple, DO  06/29/2020 1:16 PM    [facility]

## 2020-07-02 ENCOUNTER — Other Ambulatory Visit: Payer: Self-pay | Admitting: Family Medicine

## 2020-07-02 DIAGNOSIS — F418 Other specified anxiety disorders: Secondary | ICD-10-CM

## 2020-07-02 DIAGNOSIS — F5105 Insomnia due to other mental disorder: Secondary | ICD-10-CM

## 2020-07-02 DIAGNOSIS — F99 Mental disorder, not otherwise specified: Secondary | ICD-10-CM

## 2020-07-23 ENCOUNTER — Other Ambulatory Visit: Payer: Self-pay

## 2020-07-23 ENCOUNTER — Ambulatory Visit
Admission: RE | Admit: 2020-07-23 | Discharge: 2020-07-23 | Disposition: A | Discharge status: Home / Self Care | Payer: Medicare Other | Source: Ambulatory Visit | Attending: Family Medicine | Admitting: Family Medicine

## 2020-07-23 DIAGNOSIS — Z1231 Encounter for screening mammogram for malignant neoplasm of breast: Secondary | ICD-10-CM

## 2020-09-30 ENCOUNTER — Other Ambulatory Visit: Payer: Self-pay | Admitting: Family Medicine

## 2020-09-30 DIAGNOSIS — E785 Hyperlipidemia, unspecified: Secondary | ICD-10-CM

## 2020-10-11 ENCOUNTER — Other Ambulatory Visit: Payer: Self-pay | Admitting: Family Medicine

## 2020-10-11 DIAGNOSIS — I10 Essential (primary) hypertension: Secondary | ICD-10-CM

## 2020-10-12 ENCOUNTER — Telehealth: Payer: Self-pay | Admitting: Family Medicine

## 2020-10-12 NOTE — Telephone Encounter (Signed)
Pt is calling regarding new shingle vaccination and is curious to know if she has to have it. Please advise.

## 2020-10-12 NOTE — Telephone Encounter (Signed)
Lvm to call back

## 2020-10-19 ENCOUNTER — Encounter: Payer: Medicare Other | Admitting: Family Medicine

## 2020-11-20 ENCOUNTER — Other Ambulatory Visit: Payer: Self-pay

## 2020-11-20 ENCOUNTER — Ambulatory Visit (HOSPITAL_BASED_OUTPATIENT_CLINIC_OR_DEPARTMENT_OTHER)
Admission: RE | Admit: 2020-11-20 | Discharge: 2020-11-20 | Disposition: A | Discharge status: Home / Self Care | Payer: Medicare Other | Source: Ambulatory Visit | Attending: Family Medicine | Admitting: Family Medicine

## 2020-11-20 ENCOUNTER — Encounter: Payer: Self-pay | Admitting: Family Medicine

## 2020-11-20 ENCOUNTER — Ambulatory Visit (INDEPENDENT_AMBULATORY_CARE_PROVIDER_SITE_OTHER): Payer: Medicare Other | Admitting: Family Medicine

## 2020-11-20 VITALS — BP 120/86 | HR 66 | Temp 98.3°F | Resp 18 | Ht 65.0 in | Wt 134.6 lb

## 2020-11-20 DIAGNOSIS — M2550 Pain in unspecified joint: Secondary | ICD-10-CM

## 2020-11-20 DIAGNOSIS — G8929 Other chronic pain: Secondary | ICD-10-CM

## 2020-11-20 DIAGNOSIS — I10 Essential (primary) hypertension: Secondary | ICD-10-CM

## 2020-11-20 DIAGNOSIS — M25552 Pain in left hip: Secondary | ICD-10-CM | POA: Diagnosis not present

## 2020-11-20 DIAGNOSIS — Z0000 Encounter for general adult medical examination without abnormal findings: Secondary | ICD-10-CM

## 2020-11-20 DIAGNOSIS — E785 Hyperlipidemia, unspecified: Secondary | ICD-10-CM

## 2020-11-20 DIAGNOSIS — F418 Other specified anxiety disorders: Secondary | ICD-10-CM

## 2020-11-20 DIAGNOSIS — M48062 Spinal stenosis, lumbar region with neurogenic claudication: Secondary | ICD-10-CM

## 2020-11-20 NOTE — Assessment & Plan Note (Signed)
Xray and needs referral to ortho or neurosurgery

## 2020-11-20 NOTE — Assessment & Plan Note (Signed)
Stable Recheck zoloft

## 2020-11-20 NOTE — Assessment & Plan Note (Signed)
Well controlled, no changes to meds. Encouraged heart healthy diet such as the DASH diet and exercise as tolerated.  °

## 2020-11-20 NOTE — Assessment & Plan Note (Signed)
Encourage heart healthy diet such as MIND or DASH diet, increase exercise, avoid trans fats, simple carbohydrates and processed foods, consider a krill or fish or flaxseed oil cap daily.  °

## 2020-11-20 NOTE — Progress Notes (Addendum)
Subjective:   By signing my name below, I, [name], attest that this documentation has been prepared under the direction and in the presence of Dr. Seabron Spates, DO. 11/20/2020    Patient ID: Glenda Lyons, female    DOB: 05/24/1945, 75 y.o.   MRN: [id number]  Chief Complaint  Patient presents with   Annual Exam    Pt states fasting     HPI Patient is in today for a comprehensive physical exam.   She has an upcoming CT scan scheduled with her ophthalmologist. She sees a specialist to manage her glasses lenses due to her double vision.  She is requesting a recommendation for to rheumatologist due to her joint pain. She has pain in both knees but notes the left is worse than the right. She is not in pain during this visit. When her pain worsens she takes 15 mg mobic daily PO to manage it. She has pain daily.  She also reports having hip pain at this time.  She also continues having back pain. She was seeing a Careers adviser who reccommended surgery but she did not follow up on it.  She denies having any fever, new moles, congestion, sore throat, muscle pain, chest pain, cough, SOB, wheezing, n/v/d, constipation, blood in stool, dysuria, frequency, hematuria, or headaches at this time. She reports her paternal first cousin suffered a stroke a couple of years ago, her paternal first cousin has high blood pressure, her father suffered 3 heart attacks and was age 32 during his first one and passed after after the 3rd one at age 62, otherwise she has no other changes to her family medical history.  She is completing lab work during this visit and reports that she is fasting.  She has received a flu vaccine last month. She has received a Covid-19 booster vaccine last month and was informed of the new bivalent Covid-19 vaccine during this visit and is interested in getting it at a later date. She is interested in getting the shingrix vaccine at her pharmacy at a later time.  She is due for  dental care and has an upcomming appointment. She is UTD on vision care.     Past Medical History:  Diagnosis Date   Acute bacterial sinusitis 12/26/2014   Allergy    seasonal   Bilateral conjunctivitis 12/26/2014   BURSITIS, RIGHT HIP 07/10/2006   Qualifier: Diagnosis of  By: Janit Bern     Chronic midline low back pain without sciatica 05/02/2019   COLONIC POLYPS, HX OF 03/13/2010   Qualifier: Diagnosis of  By: Janit Bern     Depression    Depression with anxiety 04/13/2020   Depression, major, single episode, mild (HCC) 07/10/2006   Qualifier: Diagnosis of  By: Janit Bern     Dysuria 06/14/2010   Esodeviation 12/19/2010   Essential hypertension 07/10/2006   Qualifier: Diagnosis of  By: Janit Bern     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   Herniated disc    HERNIATED DISC 07/10/2006   Qualifier: Diagnosis of  By: Janit Bern     Hyperlipemia    Hyperlipidemia 06/03/2007   Qualifier: Diagnosis of  By: Janit Bern     Hypertension    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   Neuromuscular disorder (HCC)    New onset left bundle branch block (LBBB) 01/25/2019   Osteoarthritis    OSTEOARTHRITIS 06/03/2007   Qualifier: Diagnosis of  By: Janit Bern  Palpitation 01/25/2019   POSTMENOPAUSAL STATUS 12/29/2006   Qualifier: Diagnosis of  By: Janit Bern     Sciatica    Seborrheic keratoses 07/08/2012   Skin laxity 08/20/2013   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   Status post lumbar spinal fusion 12/21/2014   Thyroid eye disease 08/21/2011   UNSPECIFIED PERIPHERAL VASCULAR DISEASE 12/29/2006   Qualifier: Diagnosis of  By: Janit Bern      Past Surgical History:  Procedure Laterality Date   BIOPSY BREAST     BREAST BIOPSY Right    CYSTECTOMY     leg   DILATION AND CURETTAGE OF UTERUS  09/11/2010   Procedure: DILATATION AND CURETTAGE (D&C);  Surgeon: Michael Litter, MD;  Location: [facility];  Service:  Gynecology;  Laterality: N/A;   ENDOMETRIAL BIOPSY     HYSTEROSCOPY WITH RESECTOSCOPE  09/11/2010   Procedure: HYSTEROSCOPY WITH RESECTOSCOPE;  Surgeon: Michael Litter, MD;  Location: [facility];  Service: Gynecology;  Laterality: N/A;   LEEP  1994   TONSILLECTOMY      Family History  Problem Relation Age of Onset   Breast cancer Mother    Heart attack Other    Lymphoma Other    Stroke Cousin    Hypertension Cousin    Stroke Cousin    Hypertension Cousin    Colon cancer Neg Hx     Social History   Socioeconomic History   Marital status: Married    Spouse name: Not on file   Number of children: Not on file   Years of education: Not on file   Highest education level: Not on file  Occupational History   Not on file  Tobacco Use   Smoking status: Never   Smokeless tobacco: Never  Substance and Sexual Activity   Alcohol use: Yes    Comment: occasionally   Drug use: No   Sexual activity: Yes    Partners: Male  Other Topics Concern   Not on file  Social History Narrative   Exercise-- just walking up and down the stairs at home   Social Determinants of Health   Financial Resource Strain: Not on file  Food Insecurity: Not on file  Transportation Needs: Not on file  Physical Activity: Not on file  Stress: Not on file  Social Connections: Not on file  Intimate Partner Violence: Not on file    Outpatient Medications Prior to Visit  Medication Sig Dispense Refill   aspirin 81 MG tablet Take 81 mg by mouth daily.     Calcium Carb-Cholecalciferol (CALCIUM-VITAMIN D) 500-400 MG-UNIT TABS Take by mouth 2 (two) times daily.     fluticasone (FLONASE) 50 MCG/ACT nasal spray instill 2 sprays into each nostril at bedtime 16 g 2   losartan-hydrochlorothiazide (HYZAAR) 100-25 MG tablet Take 1 tablet by mouth daily. 90 tablet 1   meloxicam (MOBIC) 15 MG tablet TAKE 1/2 TO 1 TABLET BY MOUTH EVERY DAY 90 tablet 0   sertraline (ZOLOFT) 100 MG tablet Take 1.5 tablets (150 mg total) by  mouth daily. 135 tablet 1   simvastatin (ZOCOR) 40 MG tablet Take 1 tablet (40 mg total) by mouth at bedtime. 30 tablet 2   traZODone (DESYREL) 50 MG tablet Take 1/2-1 tablet (25-50 mg total) by mouth at bedtime as needed for sleep. (Patient taking differently: daily as needed.) 30 tablet 3   VITAMIN A PO Take 3,000 mg by mouth daily.     vitamin C (ASCORBIC ACID)  250 MG tablet Take 250 mg by mouth daily.     Flaxseed, Linseed, (FLAX SEED OIL) 1000 MG CAPS Take by mouth daily. (Patient not taking: Reported on 11/20/2020)     No facility-administered medications prior to visit.    Allergies  Allergen Reactions   Almond Oil Other (See Comments)    Throat raw and scratchy   Other Other (See Comments)    SUTURE MATERIAL- REDNESS, IRRITATION, TOOK A LONG TIME TO HEAL    Review of Systems  Constitutional:  Negative for fever.  HENT:  Negative for congestion and sore throat.   Respiratory:  Negative for cough, shortness of breath and wheezing.   Cardiovascular:  Negative for chest pain.  Gastrointestinal:  Negative for blood in stool, constipation, diarrhea, nausea and vomiting.  Genitourinary:  Negative for dysuria, frequency and hematuria.  Musculoskeletal:  Positive for back pain and joint pain (bilateral knee (L>R), bilaterl hips (L>R)). Negative for myalgias.  Skin:        (-)New moles  Neurological:  Negative for headaches.      Objective:    Physical Exam Constitutional:      General: She is not in acute distress.    Appearance: Normal appearance. She is not ill-appearing.  HENT:     Head: Normocephalic and atraumatic.     Right Ear: Tympanic membrane, ear canal and external ear normal.     Left Ear: Tympanic membrane, ear canal and external ear normal.  Eyes:     Extraocular Movements: Extraocular movements intact.     Pupils: Pupils are equal, round, and reactive to light.  Cardiovascular:     Rate and Rhythm: Normal rate and regular rhythm.     Heart sounds: Normal  heart sounds. No murmur heard.   No gallop.  Pulmonary:     Effort: Pulmonary effort is normal. No respiratory distress.     Breath sounds: Normal breath sounds. No wheezing or rales.  Abdominal:     General: Bowel sounds are normal. There is no distension.     Palpations: Abdomen is soft.     Tenderness: There is no abdominal tenderness. There is no guarding.  Musculoskeletal:     Comments: 5/5 strength in lower extremities with no pain  Skin:    General: Skin is warm and dry.  Neurological:     Mental Status: She is alert and oriented to person, place, and time.  Psychiatric:        Behavior: Behavior normal.        Judgment: Judgment normal.    BP 120/86 (BP Location: Left Arm, Patient Position: Sitting, Cuff Size: Normal)   Pulse 66   Temp 98.3 F (36.8 C) (Oral)   Resp 18   Ht 5\' 5"  (1.651 m)   Wt 134 lb 9.6 oz (61.1 kg)   SpO2 94%   BMI 22.40 kg/m  Wt Readings from Last 3 Encounters:  11/20/20 134 lb 9.6 oz (61.1 kg)  06/29/20 135 lb (61.2 kg)  04/13/20 136 lb 3.2 oz (61.8 kg)    Diabetic Foot Exam - Simple   No data filed    Lab Results  Component Value Date   WBC 8.5 01/25/2019   HGB 15.4 (H) 01/25/2019   HCT 46.5 (H) 01/25/2019   PLT 250.0 01/25/2019   GLUCOSE 93 04/13/2020   CHOL 197 04/13/2020   TRIG 76.0 04/13/2020   HDL 79.50 04/13/2020   LDLCALC 102 (H) 04/13/2020   ALT 23 04/13/2020   AST  21 04/13/2020   NA 141 04/13/2020   K 4.3 04/13/2020   CL 102 04/13/2020   CREATININE 0.75 04/13/2020   BUN 21 04/13/2020   CO2 29 04/13/2020   TSH 3.50 01/25/2019   HGBA1C 6.1 (H) 12/15/2005    Lab Results  Component Value Date   TSH 3.50 01/25/2019   Lab Results  Component Value Date   WBC 8.5 01/25/2019   HGB 15.4 (H) 01/25/2019   HCT 46.5 (H) 01/25/2019   MCV 94.2 01/25/2019   PLT 250.0 01/25/2019   Lab Results  Component Value Date   NA 141 04/13/2020   K 4.3 04/13/2020   CO2 29 04/13/2020   GLUCOSE 93 04/13/2020   BUN 21 04/13/2020    CREATININE 0.75 04/13/2020   BILITOT 0.8 04/13/2020   ALKPHOS 56 04/13/2020   AST 21 04/13/2020   ALT 23 04/13/2020   PROT 7.0 04/13/2020   ALBUMIN 4.4 04/13/2020   CALCIUM 9.9 04/13/2020   GFR 77.99 04/13/2020   Lab Results  Component Value Date   CHOL 197 04/13/2020   Lab Results  Component Value Date   HDL 79.50 04/13/2020   Lab Results  Component Value Date   LDLCALC 102 (H) 04/13/2020   Lab Results  Component Value Date   TRIG 76.0 04/13/2020   Lab Results  Component Value Date   CHOLHDL 2 04/13/2020   Lab Results  Component Value Date   HGBA1C 6.1 (H) 12/15/2005   Mammogram- Last completed 07/25/2020. Results are normal. Repeat in 1 year.  Dexa- Last completed 08/17/2016. Results showed she has osteoporosis. Repeat in 2 years. She is requesting an order for a new appointment.  Colonoscopy- Last completed 06/15/2013. Results are normal. Repeat in 10 years.      Assessment & Plan:   Problem List Items Addressed This Visit       Unprioritized   Depression with anxiety    Stable Recheck zoloft       Hyperlipidemia    Encourage heart healthy diet such as MIND or DASH diet, increase exercise, avoid trans fats, simple carbohydrates and processed foods, consider a krill or fish or flaxseed oil cap daily.       Relevant Orders   CBC with Differential/Platelet   Comprehensive metabolic panel   Lipid panel   Hypertension    Well controlled, no changes to meds. Encouraged heart healthy diet such as the DASH diet and exercise as tolerated.       Relevant Orders   CBC with Differential/Platelet   Comprehensive metabolic panel   Lipid panel   Preventative health care - Primary    ghm utd Check labs  See avs      Spinal stenosis of lumbar region with neurogenic claudication    Xray and needs referral to ortho or neurosurgery       Other Visit Diagnoses     Chronic pain of multiple joints       Relevant Orders   Rheumatoid Factor   VITAMIN D  25 Hydroxy (Vit-D Deficiency, Fractures)   Sedimentation rate   Left hip pain       Relevant Orders   DG Hip Unilat W OR W/O Pelvis 2-3 Views Left        No orders of the defined types were placed in this encounter.   I, Dr. Seabron Spates, DO, personally preformed the services described in this documentation.  All medical record entries made by the scribe were at my direction and in  my presence.  I have reviewed the chart and discharge instructions (if applicable) and agree that the record reflects my personal performance and is accurate and complete. 11/20/2020   I,[name],acting as a scribe for Donato Schultz, DO.,have documented all relevant documentation on the behalf of Donato Schultz, DO,as directed by  Donato Schultz, DO while in the presence of Donato Schultz, DO.   Donato Schultz, DO

## 2020-11-20 NOTE — Assessment & Plan Note (Signed)
ghm utd Check labs  See avs  

## 2020-11-20 NOTE — Assessment & Plan Note (Signed)
Mult joints Check labs  con't mobic  Pt requesting rheum referral-----  Will wait on labs

## 2020-11-20 NOTE — Patient Instructions (Signed)
Preventive Care 76 Years and Older, Female Preventive care refers to lifestyle choices and visits with your health care provider that can promote health and wellness. This includes: A yearly physical exam. This is also called an annual wellness visit. Regular dental and eye exams. Immunizations. Screening for certain conditions. Healthy lifestyle choices, such as: Eating a healthy diet. Getting regular exercise. Not using drugs or products that contain nicotine and tobacco. Limiting alcohol use. What can I expect for my preventive care visit? Physical exam Your health care provider will check your: Height and weight. These may be used to calculate your BMI (body mass index). BMI is a measurement that tells if you are at a healthy weight. Heart rate and blood pressure. Body temperature. Skin for abnormal spots. Counseling Your health care provider may ask you questions about your: Past medical problems. Family's medical history. Alcohol, tobacco, and drug use. Emotional well-being. Home life and relationship well-being. Sexual activity. Diet, exercise, and sleep habits. History of falls. Memory and ability to understand (cognition). Work and work Statistician. Pregnancy and menstrual history. Access to firearms. What immunizations do I need? Vaccines are usually given at various ages, according to a schedule. Your health care provider will recommend vaccines for you based on your age, medical history, and lifestyle or other factors, such as travel or where you work. What tests do I need? Blood tests Lipid and cholesterol levels. These may be checked every 5 years, or more often depending on your overall health. Hepatitis C test. Hepatitis B test. Screening Lung cancer screening. You may have this screening every year starting at age 33 if you have a 30-pack-year history of smoking and currently smoke or have quit within the past 15 years. Colorectal cancer screening. All  adults should have this screening starting at age 76 and continuing until age 9. Your health care provider may recommend screening at age 76 if you are at increased risk. You will have tests every 1-10 years, depending on your results and the type of screening test. Diabetes screening. This is done by checking your blood sugar (glucose) after you have not eaten for a while (fasting). You may have this done every 1-3 years. Mammogram. This may be done every 1-2 years. Talk with your health care provider about how often you should have regular mammograms. Abdominal aortic aneurysm (AAA) screening. You may need this if you are a current or former smoker. BRCA-related cancer screening. This may be done if you have a family history of breast, ovarian, tubal, or peritoneal cancers. Other tests STD (sexually transmitted disease) testing, if you are at risk. Bone density scan. This is done to screen for osteoporosis. You may have this done starting at age 76. Talk with your health care provider about your test results, treatment options, and if necessary, the need for more tests. Follow these instructions at home: Eating and drinking  Eat a diet that includes fresh fruits and vegetables, whole grains, lean protein, and low-fat dairy products. Limit your intake of foods with high amounts of sugar, saturated fats, and salt. Take vitamin and mineral supplements as recommended by your health care provider. Do not drink alcohol if your health care provider tells you not to drink. If you drink alcohol: Limit how much you have to 0-1 drink a day. Be aware of how much alcohol is in your drink. In the [place] one drink equals one 12 oz bottle of beer (355 mL), one 5 oz glass of wine (148 mL), or one  1 oz glass of hard liquor (44 mL). Lifestyle Take daily care of your teeth and gums. Brush your teeth every morning and night with fluoride toothpaste. Floss one time each day. Stay active. Exercise for at least  30 minutes 5 or more days each week. Do not use any products that contain nicotine or tobacco, such as cigarettes, e-cigarettes, and chewing tobacco. If you need help quitting, ask your health care provider. Do not use drugs. If you are sexually active, practice safe sex. Use a condom or other form of protection in order to prevent STIs (sexually transmitted infections). Talk with your health care provider about taking a low-dose aspirin or statin. Find healthy ways to cope with stress, such as: Meditation, yoga, or listening to music. Journaling. Talking to a trusted person. Spending time with friends and family. Safety Always wear your seat belt while driving or riding in a vehicle. Do not drive: If you have been drinking alcohol. Do not ride with someone who has been drinking. When you are tired or distracted. While texting. Wear a helmet and other protective equipment during sports activities. If you have firearms in your house, make sure you follow all gun safety procedures. What's next? Visit your health care provider once a year for an annual wellness visit. Ask your health care provider how often you should have your eyes and teeth checked. Stay up to date on all vaccines. This information is not intended to replace advice given to you by your health care provider. Make sure you discuss any questions you have with your health care provider. Document Revised: 04/06/2020 Document Reviewed: 01/21/2018 Elsevier Patient Education  2022 [employer].

## 2020-11-21 LAB — VITAMIN D 25 HYDROXY (VIT D DEFICIENCY, FRACTURES): VITD: 42.15 ng/mL (ref 30.00–100.00)

## 2020-11-21 LAB — COMPREHENSIVE METABOLIC PANEL
ALT: 17 U/L (ref 0–35)
AST: 22 U/L (ref 0–37)
Albumin: 4.5 g/dL (ref 3.5–5.2)
Alkaline Phosphatase: 57 U/L (ref 39–117)
BUN: 28 mg/dL — ABNORMAL HIGH (ref 6–23)
CO2: 28 mEq/L (ref 19–32)
Calcium: 9.8 mg/dL (ref 8.4–10.5)
Chloride: 103 mEq/L (ref 96–112)
Creatinine, Ser: 0.75 mg/dL (ref 0.40–1.20)
GFR: 77.65 mL/min (ref >60.00)
Glucose, Bld: 94 mg/dL (ref 70–99)
Potassium: 3.8 mEq/L (ref 3.5–5.1)
Sodium: 141 mEq/L (ref 135–145)
Total Bilirubin: 0.6 mg/dL (ref 0.2–1.2)
Total Protein: 6.9 g/dL (ref 6.0–8.3)

## 2020-11-21 LAB — CBC WITH DIFFERENTIAL/PLATELET
Basophils Absolute: 0 10*3/uL (ref 0.0–0.1)
Basophils Relative: 0.8 % (ref 0.0–3.0)
Eosinophils Absolute: 0.2 10*3/uL (ref 0.0–0.7)
Eosinophils Relative: 2.5 % (ref 0.0–5.0)
HCT: 41.6 % (ref 36.0–46.0)
Hemoglobin: 13.7 g/dL (ref 12.0–15.0)
Lymphocytes Relative: 27.6 % (ref 12.0–46.0)
Lymphs Abs: 1.8 10*3/uL (ref 0.7–4.0)
MCHC: 33 g/dL (ref 30.0–36.0)
MCV: 95.2 fl (ref 78.0–100.0)
Monocytes Absolute: 0.5 10*3/uL (ref 0.1–1.0)
Monocytes Relative: 7 % (ref 3.0–12.0)
Neutro Abs: 4.1 10*3/uL (ref 1.4–7.7)
Neutrophils Relative %: 62.1 % (ref 43.0–77.0)
Platelets: 203 10*3/uL (ref 150.0–400.0)
RBC: 4.37 Mil/uL (ref 3.87–5.11)
RDW: 13 % (ref 11.5–15.5)
WBC: 6.5 10*3/uL (ref 4.0–10.5)

## 2020-11-21 LAB — LIPID PANEL
Cholesterol: 184 mg/dL (ref 0–200)
HDL: 72 mg/dL (ref >39.00)
LDL Cholesterol: 97 mg/dL (ref 0–99)
NonHDL: 111.64
Total CHOL/HDL Ratio: 3
Triglycerides: 71 mg/dL (ref 0.0–149.0)
VLDL: 14.2 mg/dL (ref 0.0–40.0)

## 2020-11-21 LAB — SEDIMENTATION RATE: Sed Rate: 6 mm/hr (ref 0–30)

## 2020-11-21 LAB — RHEUMATOID FACTOR: Rheumatoid fact SerPl-aCnc: 41 IU/mL — ABNORMAL HIGH (ref <14)

## 2020-11-28 ENCOUNTER — Other Ambulatory Visit: Payer: Self-pay

## 2020-11-28 DIAGNOSIS — R768 Other specified abnormal immunological findings in serum: Secondary | ICD-10-CM

## 2020-12-22 ENCOUNTER — Other Ambulatory Visit: Payer: Self-pay | Admitting: Family Medicine

## 2020-12-22 DIAGNOSIS — F320 Major depressive disorder, single episode, mild: Secondary | ICD-10-CM

## 2020-12-25 ENCOUNTER — Other Ambulatory Visit: Payer: Self-pay | Admitting: Family Medicine

## 2020-12-25 DIAGNOSIS — M5440 Lumbago with sciatica, unspecified side: Secondary | ICD-10-CM

## 2021-01-13 NOTE — Progress Notes (Signed)
Office Visit Note  Patient: Glenda Lyons             Date of Birth: [birth year]           MRN: [id number]             PCP: [name], DO Referring: [name], * Visit Date: 01/14/2021 Occupation: Retired past secretarial work teaching work  Subjective:  New Patient (Initial Visit) (Bil hip and SI joint pain and tenderness, abnormal labs)   History of Present Illness: Glenda Lyons is a 75 y.o. female here for evaluation of joint pains involving back, hips, and knees with positive rheumatoid factor.  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  She had surgery for L1-L2 disc disease more than 5 years ago with improvement of the severe pain at that level.  More recently she has had increased trouble lower in the back where the pelvis and hips.  She does not recall a specific onset, overall somewhat neglected personal health and medical issues last year due to major health problems affecting her husband with multiple weeklong hospitalizations.  But symptoms are certainly ongoing at least since last year.  She has a prolonged morning stiffness up to 2 hours duration before moving freely or with a normal gait.  She notices symptoms more when sitting in a fixed position for prolonged periods.  Sometimes pain in the lateral and posterior of the hip will cause difficulty sleeping. She notices improvement with meloxicam and when taking prednisone intermittently, although she tries to avoid this most of the time due to steroids worsening hypertension. Besides the pain in her back and hips she has noticed some peripheral areas with increased nodules or widening at the right 5th DIP also gets pain in her lateral distal legs. She does not notice significant joint swelling.  Labs reviewed 11/2020 RF 41 ESR 6 CBC wnl CMP wnl Vit D 42.15  11/20/20 Xray left hip IMPRESSION: 1. Minor left hip joint  space narrowing. 2. Minimal enthesopathic change at the greater trochanter.   03/26/19 MRI lumbar spine IMPRESSION: L2-3: 2-3 mm retrolisthesis. Slight enlargement of a disc protrusion more pronounced towards the left. Facet and ligamentous hypertrophy. Stenosis of the lateral recesses and foramina, slightly worse than on the previous study and left worse than right. Also, there is worsened discogenic endplate marrow edema. Findings at this level could be symptomatic. L3-4: 2-3 mm of retrolisthesis. Disc degeneration with endplate osteophytes, bulging of the disc and facet hypertrophy. Stenosis of the lateral recesses and foramina that could cause neural compression on either or both sides. Discogenic edema which is worsened since the previous study. No change of the surgical level of L4-5. Disc degeneration at L5-S1 with mild narrowing of the lateral recesses and foramina but no distinct neural compression or apparent change since 2019.  03/22/19 Xray right hip 2 view radiographs of the right hip shows a congruent joint space with no subochondral defects no bony spurs  Activities of Daily Living:  Patient reports morning stiffness for 2 hours.   Patient Denies nocturnal pain.  Difficulty dressing/grooming: Denies Difficulty climbing stairs: Reports Difficulty getting out of chair: Reports Difficulty using hands for taps, buttons, cutlery, and/or writing: Denies  Review of Systems  Constitutional:  Positive for fatigue.  HENT:  Positive for mouth dryness.   Eyes:  Positive for dryness.  Respiratory:  Negative for shortness of breath.  Cardiovascular:  Negative for swelling in legs/feet.  Gastrointestinal:  Positive for constipation.  Endocrine: Positive for increased urination.  Genitourinary:  Negative for difficulty urinating.  Musculoskeletal:  Positive for joint pain, gait problem, joint pain and morning stiffness.  Skin:  Negative for rash.  Allergic/Immunologic: Negative for  susceptible to infections.  Neurological:  Negative for numbness.  Hematological:  Negative for bruising/bleeding tendency.  Psychiatric/Behavioral:  Negative for sleep disturbance.    PMFS History:  Patient Active Problem List   Diagnosis Date Noted   Rheumatoid factor positive 01/14/2021   Bilateral hip pain 01/14/2021   Preventative health care 11/20/2020   Allergy    Depression    Hyperlipemia    Hypertension    Neuromuscular disorder ([place])    Osteoarthritis    Sciatica    Depression with anxiety 04/13/2020   Chronic midline low back pain without sciatica 05/02/2019   Palpitation 01/25/2019   New onset left bundle branch block (LBBB) 01/25/2019   Acute bacterial sinusitis 12/26/2014   Bilateral conjunctivitis 12/26/2014   Status post lumbar spinal fusion 12/21/2014   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   Skin laxity 08/20/2013   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   Seborrheic keratoses 07/08/2012   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   Thyroid eye disease 08/21/2011   Esodeviation 12/19/2010   Dysuria 06/14/2010   COLONIC POLYPS, HX OF 03/13/2010   Hyperlipidemia 06/03/2007   OSTEOARTHRITIS 06/03/2007   UNSPECIFIED PERIPHERAL VASCULAR DISEASE 12/29/2006   POSTMENOPAUSAL STATUS 12/29/2006   Depression, major, single episode, mild ([place]) 07/10/2006   Essential hypertension 07/10/2006   HERNIATED [place] 07/10/2006   BURSITIS, RIGHT HIP 07/10/2006    Past Medical History:  Diagnosis Date   Acute bacterial sinusitis 12/26/2014   Allergy    seasonal   Bilateral conjunctivitis 12/26/2014   Bursitis    BURSITIS, RIGHT HIP 07/10/2006   Qualifier: Diagnosis of  By: Jerold Coombe     Chronic midline low back pain without sciatica 05/02/2019   COLONIC POLYPS, HX OF 03/13/2010   Qualifier: Diagnosis of  By: Jerold Coombe     Depression    Depression with anxiety 04/13/2020   Depression, major, single episode, mild ([place])  07/10/2006   Qualifier: Diagnosis of  By: Jerold Coombe     Dysuria 06/14/2010   Esodeviation 12/19/2010   Essential hypertension 07/10/2006   Qualifier: Diagnosis of  By: Jerold Coombe     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   Herniated disc    HERNIATED DISC 07/10/2006   Qualifier: Diagnosis of  By: Jerold Coombe     Hyperlipemia    Hyperlipidemia 06/03/2007   Qualifier: Diagnosis of  By: Jerold Coombe     Hypertension    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   Neuromuscular disorder ([place])    New onset left bundle branch block (LBBB) 01/25/2019   Osteoarthritis    OSTEOARTHRITIS 06/03/2007   Qualifier: Diagnosis of  By: Jerold Coombe     Palpitation 01/25/2019   POSTMENOPAUSAL STATUS 12/29/2006   Qualifier: Diagnosis of  By: Jerold Coombe     Sciatica    Seborrheic keratoses 07/08/2012   Skin laxity 08/20/2013   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   Status post lumbar spinal fusion 12/21/2014   Thyroid eye disease 08/21/2011   UNSPECIFIED PERIPHERAL VASCULAR DISEASE 12/29/2006   Qualifier: Diagnosis of  By: Jerold Coombe  Family History  Problem Relation Age of Onset   Breast cancer Mother    Heart attack Other    Lymphoma Other    Stroke Cousin    Hypertension Cousin    Stroke Cousin    Hypertension Cousin    Colon cancer Neg Hx    Past Surgical History:  Procedure Laterality Date   BIOPSY BREAST     BREAST BIOPSY Right    CYSTECTOMY     leg   DILATION AND CURETTAGE OF UTERUS  09/11/2010   Procedure: DILATATION AND CURETTAGE (D&C);  Surgeon: Betsy Coder, MD;  Location: [facility];  Service: Gynecology;  Laterality: N/A;   ENDOMETRIAL BIOPSY     HYSTEROSCOPY WITH RESECTOSCOPE  09/11/2010   Procedure: HYSTEROSCOPY WITH RESECTOSCOPE;  Surgeon: Betsy Coder, MD;  Location: [facility];  Service: Gynecology;  Laterality: N/A;   LEEP  1994   TONSILLECTOMY     Social History   Social History Narrative    Exercise-- just walking up and down the stairs at home   Immunization History  Administered Date(s) Administered   Fluad Quad(high Dose 65+) 04/13/2020   Influenza Split 12/11/2011, 10/01/2015   Influenza Whole 09/19/2008   Influenza, High Dose Seasonal PF 11/09/2012, 01/08/2014, 03/27/2017   Influenza-Unspecified 11/16/2014, 12/09/2018, 09/10/2020   PFIZER(Purple Top)SARS-COV-2 Vaccination 04/13/2019, 05/11/2019, 10/28/2019   Pneumococcal Conjugate-13 09/22/2013   Pneumococcal Polysaccharide-23 03/13/2010   Td 03/13/2010     Objective: Vital Signs: BP (!) 179/68 (BP Location: Right Arm, Patient Position: Sitting, Cuff Size: Normal)   Pulse 67   Resp 16   Ht 5' 4" (1.626 m)   Wt 143 lb (64.9 kg)   BMI 24.55 kg/m    Physical Exam Eyes:     Conjunctiva/sclera: Conjunctivae normal.  Cardiovascular:     Rate and Rhythm: Normal rate and regular rhythm.  Pulmonary:     Effort: Pulmonary effort is normal.     Breath sounds: Normal breath sounds.  Musculoskeletal:     Right lower leg: No edema.     Left lower leg: No edema.  Skin:    General: Skin is warm and dry.     Findings: No rash.  Neurological:     Mental Status: She is alert.     Deep Tendon Reflexes: Reflexes normal.  Psychiatric:        Mood and Affect: Mood normal.     Musculoskeletal Exam:  Shoulders full ROM no tenderness or swelling Elbows full ROM no tenderness or swelling Wrists full ROM no tenderness or swelling Fingers full ROM no tenderness or swelling, bony nodule of right 5th DIP No paraspinal tenderness to palpation over upper and lower back Hip normal internal and external rotation without pain, no tenderness to lateral hip palpation Knees full ROM no tenderness or swelling, patellofemoral crepitus present Ankles no tenderness or swelling, limited dorsiflexion ROM   Investigation: No additional findings.  Imaging: No results found.  Recent Labs: Lab Results  Component Value Date   WBC  6.5 11/20/2020   HGB 13.7 11/20/2020   PLT 203.0 11/20/2020   NA 141 11/20/2020   K 3.8 11/20/2020   CL 103 11/20/2020   CO2 28 11/20/2020   GLUCOSE 94 11/20/2020   BUN 28 (H) 11/20/2020   CREATININE 0.75 11/20/2020   BILITOT 0.6 11/20/2020   ALKPHOS 57 11/20/2020   AST 22 11/20/2020   ALT 17 11/20/2020   PROT 6.9 11/20/2020   ALBUMIN 4.5 11/20/2020   CALCIUM 9.8 11/20/2020  GFRAA >60 09/11/2010    Speciality Comments: No specialty comments available.  Procedures:  No procedures performed Allergies: Almond oil and Other   Assessment / Plan:     Visit Diagnoses: Rheumatoid factor positive - Plan: Cyclic citrul peptide antibody, IgG, C-reactive protein  Positive rheumatoid factor but normal sedimentation rate no specific signs of inflammation on exam today.  Although difficult to clinically assess the hip joints but range of motion and strength are pretty good.  We will check CCP antibody test which is somewhat more specific also checking CRP for additional measure of inflammatory activity.  Bilateral hip pain   Joint pains but pretty normal exam today and x-ray imaging with mild to moderate degree of osteoarthritis.  Some problem may be related to compensation if nothing very strongly specific on lab testing may also benefit with physical therapy work on this problem.   Orders: Orders Placed This Encounter  Procedures   Cyclic citrul peptide antibody, IgG   C-reactive protein    No orders of the defined types were placed in this encounter.    Follow-Up Instructions: No follow-ups on file.   Collier Salina, MD  Note - This record has been created using [employer].  Chart creation errors have been sought, but may not always  have been located. Such creation errors do not reflect on  the standard of medical care.

## 2021-01-14 ENCOUNTER — Ambulatory Visit (INDEPENDENT_AMBULATORY_CARE_PROVIDER_SITE_OTHER): Payer: Medicare Other | Admitting: Internal Medicine

## 2021-01-14 ENCOUNTER — Other Ambulatory Visit: Payer: Self-pay

## 2021-01-14 ENCOUNTER — Encounter: Payer: Self-pay | Admitting: Internal Medicine

## 2021-01-14 VITALS — BP 179/68 | HR 67 | Resp 16 | Ht 64.0 in | Wt 143.0 lb

## 2021-01-14 DIAGNOSIS — R768 Other specified abnormal immunological findings in serum: Secondary | ICD-10-CM | POA: Diagnosis not present

## 2021-01-14 DIAGNOSIS — M25552 Pain in left hip: Secondary | ICD-10-CM

## 2021-01-14 DIAGNOSIS — M25551 Pain in right hip: Secondary | ICD-10-CM

## 2021-01-15 LAB — C-REACTIVE PROTEIN: CRP: 2.1 mg/L (ref <8.0)

## 2021-01-15 LAB — CYCLIC CITRUL PEPTIDE ANTIBODY, IGG: Cyclic Citrullin Peptide Ab: <16 UNITS

## 2021-01-20 ENCOUNTER — Other Ambulatory Visit: Payer: Self-pay | Admitting: Family Medicine

## 2021-01-20 DIAGNOSIS — E785 Hyperlipidemia, unspecified: Secondary | ICD-10-CM

## 2021-01-21 NOTE — Progress Notes (Signed)
Additional RA antibody test and inflammatory marker are negative I don't see much evidence for rheumatoid arthritis despite her having a positive rheumatoid factor. I do not recommend starting medications. We could follow up next year as needed if she experiences increase in symptoms especially joint swelling or changes in more joints. I agree with continuing meloxicam as needed for her symptoms.  We can refer to physical therapy for symptoms especially hip pains if she is interested.

## 2021-04-12 ENCOUNTER — Telehealth: Payer: Self-pay | Admitting: Family Medicine

## 2021-04-12 NOTE — Telephone Encounter (Signed)
Left message for patient to call back and schedule Medicare Annual Wellness Visit (AWV) in office.  ° °If not able to come in office, please offer to do virtually or by telephone.  Left office number and my jabber #[phone]. ° °Due for AWVI ° °Please schedule at anytime with Nurse Health Advisor. °  °

## 2021-04-28 ENCOUNTER — Other Ambulatory Visit: Payer: Self-pay | Admitting: Family Medicine

## 2021-04-28 DIAGNOSIS — E785 Hyperlipidemia, unspecified: Secondary | ICD-10-CM

## 2021-04-28 DIAGNOSIS — I10 Essential (primary) hypertension: Secondary | ICD-10-CM

## 2021-05-21 ENCOUNTER — Ambulatory Visit: Payer: Medicare Other | Admitting: Family Medicine

## 2021-06-20 ENCOUNTER — Ambulatory Visit: Payer: Medicare Other | Admitting: Family Medicine

## 2021-06-20 ENCOUNTER — Telehealth: Payer: Self-pay | Admitting: Family Medicine

## 2021-06-20 NOTE — Telephone Encounter (Signed)
Left message for patient to call back and schedule Medicare Annual Wellness Visit (AWV).   Please offer to do virtually or by telephone.  Left office number and my jabber #[phone].  Last AWV:09/23/2016  Please schedule at anytime with Nurse Health Advisor.   

## 2021-07-17 ENCOUNTER — Telehealth: Payer: Self-pay | Admitting: Family Medicine

## 2021-07-17 NOTE — Telephone Encounter (Signed)
Left message for patient to call back and schedule Medicare Annual Wellness Visit (AWV).   Please offer to do virtually or by telephone.  Left office number and my jabber #[phone].  Last AWV:09/23/2016  Please schedule at anytime with Nurse Health Advisor.   

## 2021-08-01 ENCOUNTER — Other Ambulatory Visit: Payer: Self-pay | Admitting: Family Medicine

## 2021-08-01 DIAGNOSIS — F320 Major depressive disorder, single episode, mild: Secondary | ICD-10-CM

## 2021-09-05 ENCOUNTER — Other Ambulatory Visit: Payer: Self-pay | Admitting: Family Medicine

## 2021-09-05 DIAGNOSIS — E785 Hyperlipidemia, unspecified: Secondary | ICD-10-CM

## 2021-09-05 DIAGNOSIS — Z1231 Encounter for screening mammogram for malignant neoplasm of breast: Secondary | ICD-10-CM

## 2021-09-09 ENCOUNTER — Telehealth: Payer: Self-pay | Admitting: Family Medicine

## 2021-09-09 DIAGNOSIS — Z13820 Encounter for screening for osteoporosis: Secondary | ICD-10-CM

## 2021-09-09 NOTE — Telephone Encounter (Signed)
Pt stated she spoke with someone up front about getting orders for a bone density scan but has not heard anything back. She would like it sent to imaging downstairs.

## 2021-09-09 NOTE — Telephone Encounter (Signed)
Okay to place order? °

## 2021-09-10 ENCOUNTER — Ambulatory Visit
Admission: RE | Admit: 2021-09-10 | Discharge: 2021-09-10 | Disposition: A | Discharge status: Home / Self Care | Payer: Medicare Other | Source: Ambulatory Visit | Attending: Family Medicine | Admitting: Family Medicine

## 2021-09-10 DIAGNOSIS — Z1231 Encounter for screening mammogram for malignant neoplasm of breast: Secondary | ICD-10-CM

## 2021-09-10 NOTE — Telephone Encounter (Signed)
Orders placed for downstairs

## 2021-09-16 ENCOUNTER — Ambulatory Visit (HOSPITAL_BASED_OUTPATIENT_CLINIC_OR_DEPARTMENT_OTHER)
Admission: RE | Admit: 2021-09-16 | Discharge: 2021-09-16 | Disposition: A | Discharge status: Home / Self Care | Payer: Medicare Other | Source: Ambulatory Visit | Attending: Family Medicine | Admitting: Family Medicine

## 2021-09-16 DIAGNOSIS — Z780 Asymptomatic menopausal state: Secondary | ICD-10-CM | POA: Insufficient documentation

## 2021-09-16 DIAGNOSIS — Z13820 Encounter for screening for osteoporosis: Secondary | ICD-10-CM | POA: Diagnosis not present

## 2021-09-24 ENCOUNTER — Telehealth: Payer: Self-pay | Admitting: Family Medicine

## 2021-09-24 NOTE — Telephone Encounter (Signed)
Left message for patient to call back and schedule Medicare Annual Wellness Visit (AWV).   Please offer to do virtually or by telephone.  Left office number and my jabber [phone].  Last AWV:09/23/2016  Please schedule at anytime with Nurse Health Advisor.

## 2021-10-15 ENCOUNTER — Other Ambulatory Visit: Payer: Self-pay | Admitting: Family Medicine

## 2021-10-15 DIAGNOSIS — M5440 Lumbago with sciatica, unspecified side: Secondary | ICD-10-CM

## 2021-11-10 IMAGING — MR MR LUMBAR SPINE W/O CM
4 of 5 series · 25 of 48 positions shown · non-contrast
Comparison: 07/11/2017

CLINICAL DATA: Low back pain.  Right hip and leg pain weakness.

EXAM:
MRI LUMBAR SPINE WITHOUT CONTRAST
TECHNIQUE: Multiplanar, multisequence MR imaging of the lumbar spine was
performed. No intravenous contrast was administered.

[Series 3: T2 · sagittal · 4.0mm · 0.81mm/px · 6 of 15 slices shown (1 of 2)]
[im 1/15]
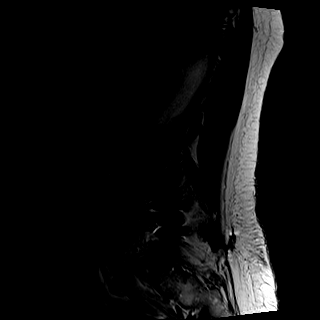
[im 3/15]
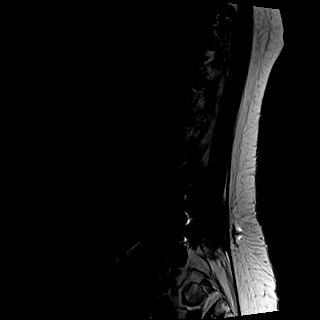
[im 6/15]
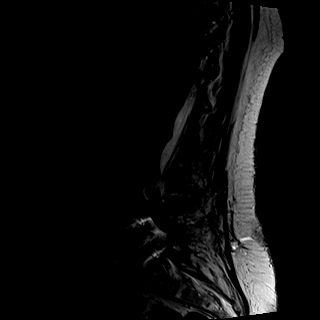
[im 9/15]
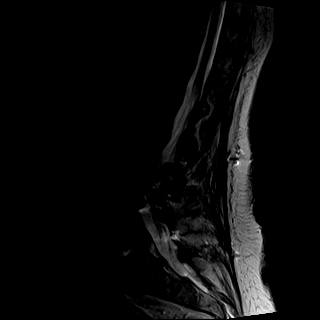
[im 12/15]
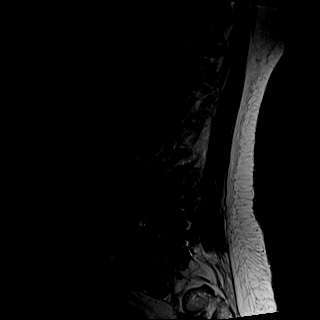
[im 15/15]
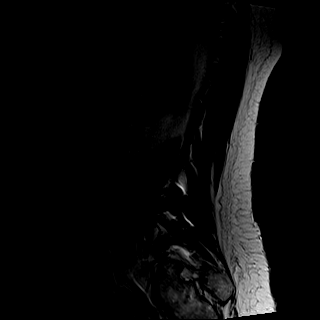

[Series 4: T1 · sagittal · 4.0mm · 0.41mm/px · 6 of 15 slices shown (1 of 2)]
[im 1/15]
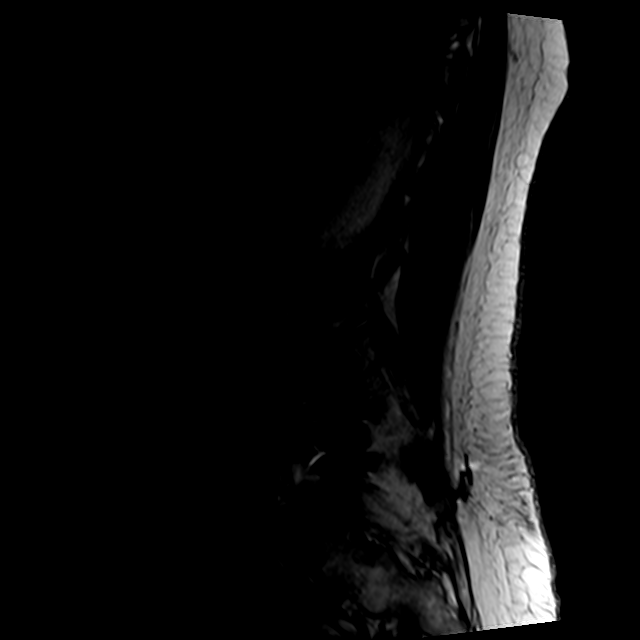
[im 3/15]
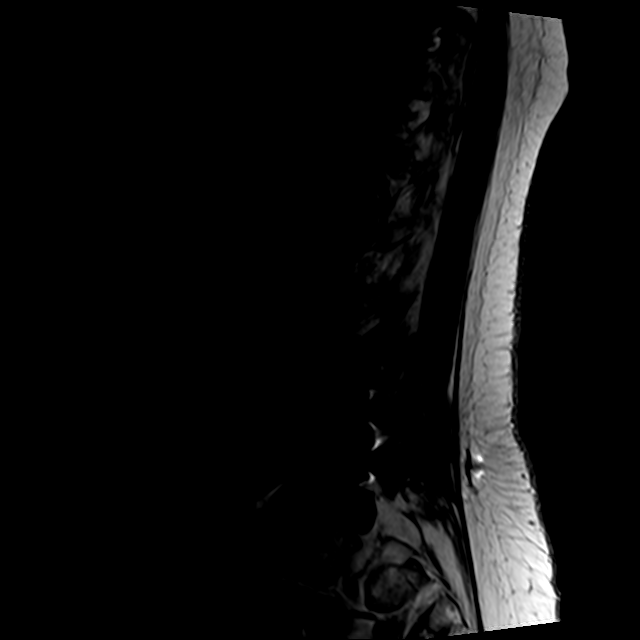
[im 6/15]
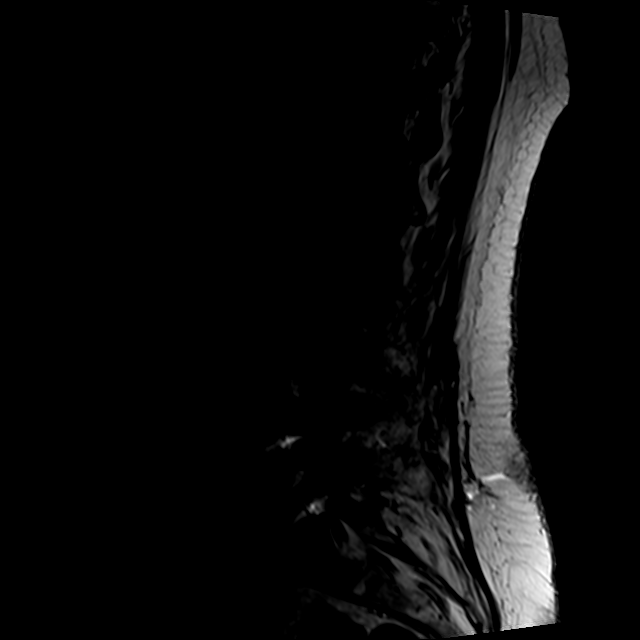
[im 9/15]
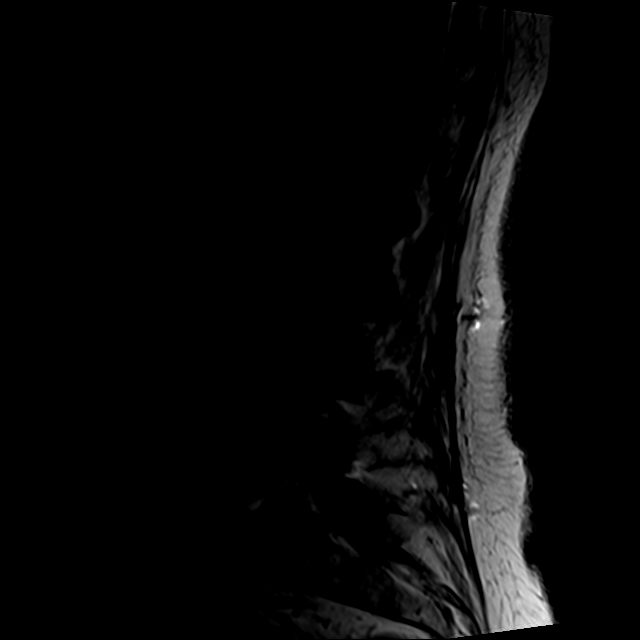
[im 12/15]
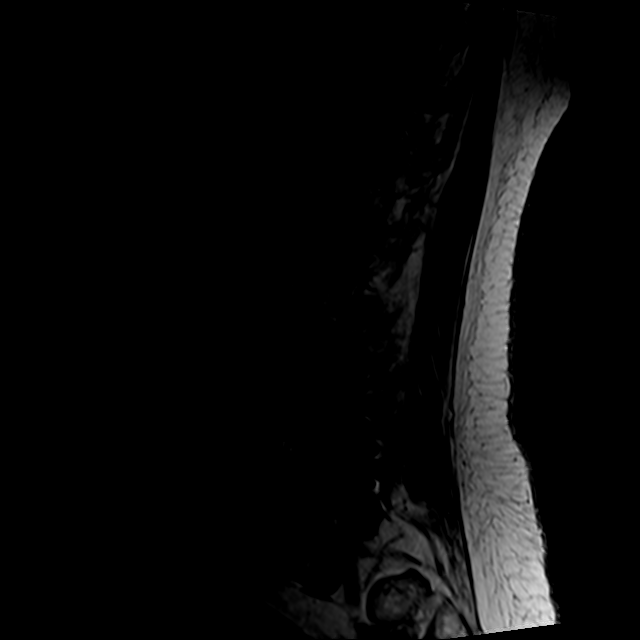
[im 15/15]
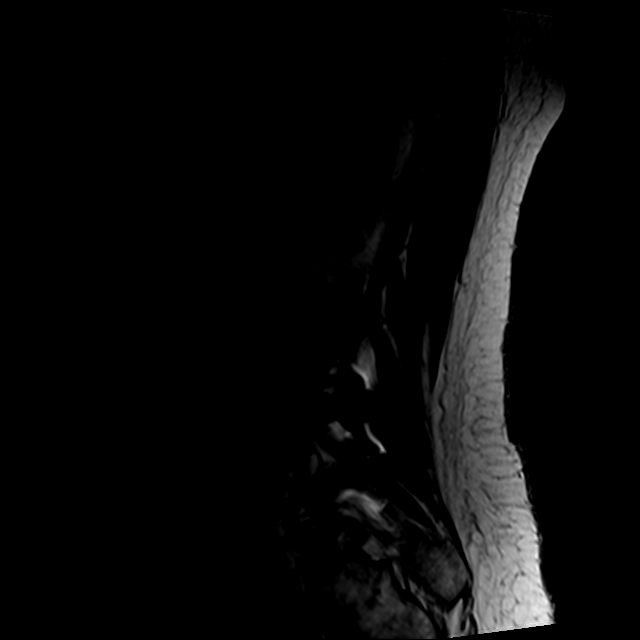

[Series 6: T2 · axial · 4.0mm · 0.78mm/px · z∈[-81,+111]mm · 9 of 36 slices shown (2 of 2)]
[im 1/36]
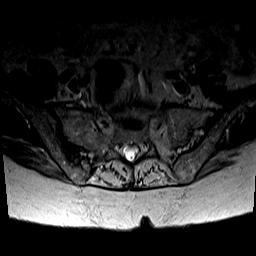
[im 6/36]
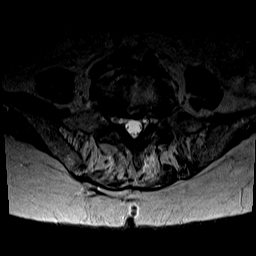
[im 11/36]
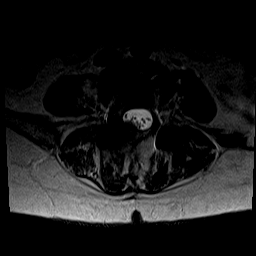
[im 16/36]
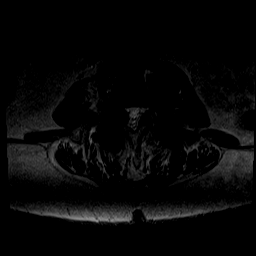
[im 18/36]
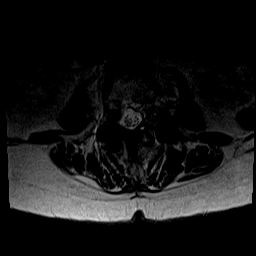
[im 21/36]
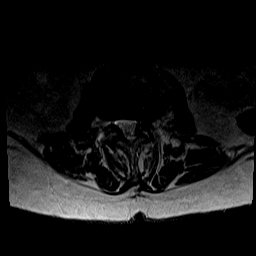
[im 26/36]
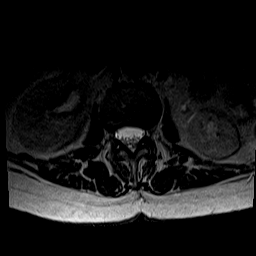
[im 31/36]
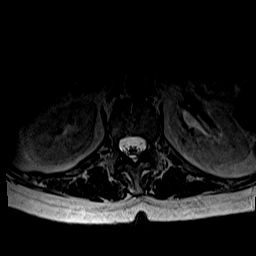
[im 36/36]
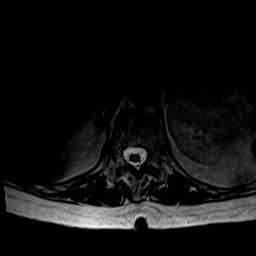

[Series 7: T1 · axial · 4.0mm · 0.39mm/px · z∈[-81,+86]mm · 4 of 36 slices shown (2 of 2)]
[im 1/36]
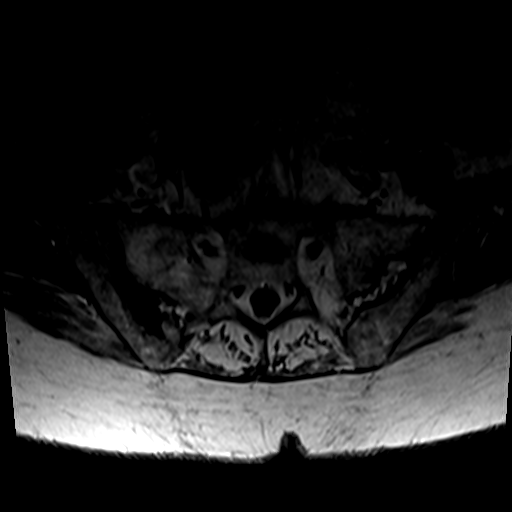
[im 6/36]
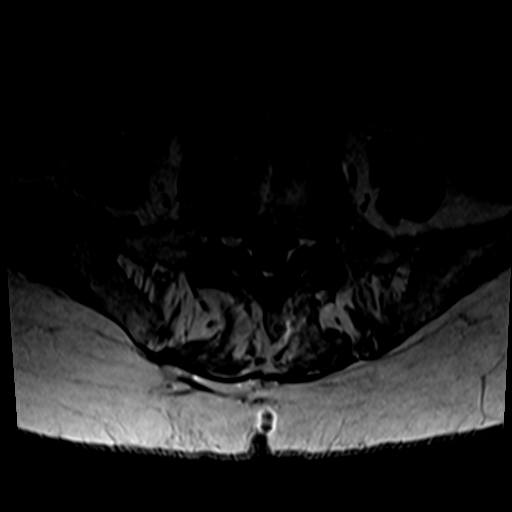
[im 18/36]
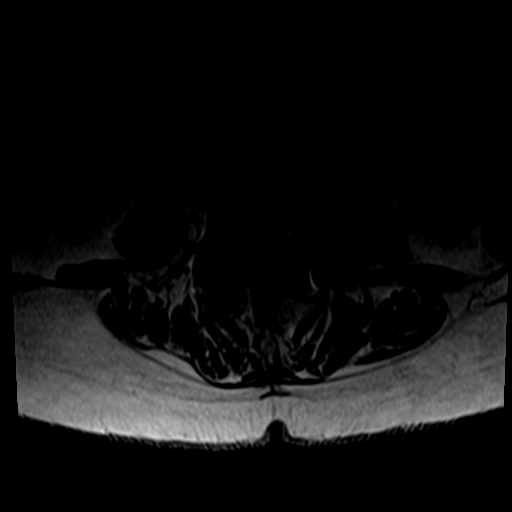
[im 31/36]
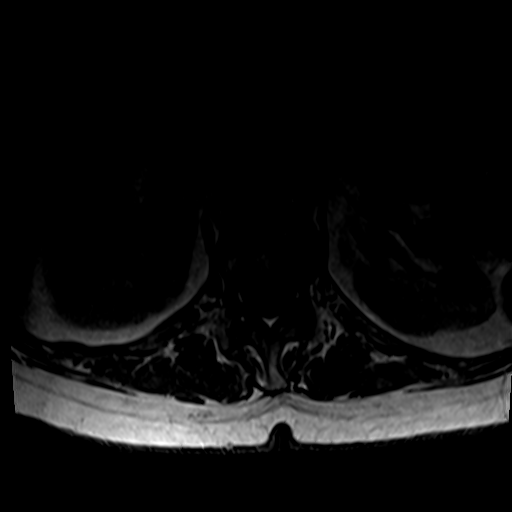

[25 of 48 positions shown; findings below may reference images not displayed]

FINDINGS: Segmentation: 5 lumbar type vertebral bodies as numbered previously.

Alignment:  2-3 mm retrolisthesis L2-3 and L3-4.

Vertebrae:  Previous decompression and fusion procedure at L4-5.

Conus medullaris and cauda equina: Conus extends to the L1 level.
Conus and cauda equina appear normal.

Paraspinal and other soft tissues: Negative

Disc levels:

No abnormality at T11-12 or T12-L1.

L1-2: Minimal disc bulge.  No stenosis.

L2-3: Retrolisthesis of 2-3 mm. Disc degeneration with shallow disc
protrusion more pronounced towards the left. Facet and ligamentous
hypertrophy. This has enlarged slightly since the previous study.
Stenosis of the lateral recesses could cause neural compression on
either or both sides. Findings slightly worse on the left.
Discogenic endplate marrow edema has worsened since the previous
study.

L3-4: 2-3 mm retrolisthesis. Disc degeneration which has worsened,
with more marrow edema. Endplate osteophytes and bulging of the
disc. Facet and ligamentous hypertrophy. Stenosis of the lateral
recesses and neural foramina that could cause neural compression on
either or both sides.

L4-5: Previous posterior decompression and fusion procedure. No
change. No apparent compressive canal or foraminal stenosis.

L5-S1: Chronic disc degeneration with endplate osteophytes and mild
bulging of the disc. Mild narrowing of the subarticular lateral
recesses and neural foramina without definite neural compression. No
apparent change.
IMPRESSION: L2-3: 2-3 mm retrolisthesis. Slight enlargement of a disc protrusion
more pronounced towards the left. Facet and ligamentous hypertrophy.
Stenosis of the lateral recesses and foramina, slightly worse than
on the previous study and left worse than right. Also, there is
worsened discogenic endplate marrow edema. Findings at this level
could be symptomatic.

L3-4: 2-3 mm of retrolisthesis. Disc degeneration with endplate
osteophytes, bulging of the disc and facet hypertrophy. Stenosis of
the lateral recesses and foramina that could cause neural
compression on either or both sides. Discogenic edema which is
worsened since the previous study.

No change of the surgical level of L4-5.

Disc degeneration at L5-S1 with mild narrowing of the lateral
recesses and foramina but no distinct neural compression or apparent
change since [DATE].

## 2021-11-23 ENCOUNTER — Other Ambulatory Visit: Payer: Self-pay | Admitting: Family Medicine

## 2021-11-23 DIAGNOSIS — I10 Essential (primary) hypertension: Secondary | ICD-10-CM

## 2021-12-05 ENCOUNTER — Ambulatory Visit: Payer: Medicare Other | Admitting: Family Medicine

## 2021-12-05 ENCOUNTER — Encounter: Payer: Self-pay | Admitting: Family Medicine

## 2021-12-05 VITALS — BP 118/80 | HR 70 | Temp 98.3°F | Resp 16 | Ht 64.0 in | Wt 143.2 lb

## 2021-12-05 DIAGNOSIS — I10 Essential (primary) hypertension: Secondary | ICD-10-CM | POA: Diagnosis not present

## 2021-12-05 DIAGNOSIS — E785 Hyperlipidemia, unspecified: Secondary | ICD-10-CM

## 2021-12-05 DIAGNOSIS — F320 Major depressive disorder, single episode, mild: Secondary | ICD-10-CM

## 2021-12-05 DIAGNOSIS — M5440 Lumbago with sciatica, unspecified side: Secondary | ICD-10-CM

## 2021-12-05 DIAGNOSIS — M25551 Pain in right hip: Secondary | ICD-10-CM

## 2021-12-05 DIAGNOSIS — M25552 Pain in left hip: Secondary | ICD-10-CM

## 2021-12-05 DIAGNOSIS — F418 Other specified anxiety disorders: Secondary | ICD-10-CM

## 2021-12-05 MED ORDER — MELOXICAM 15 MG PO TABS: 7.5000 mg | ORAL_TABLET | Freq: Every day | ORAL | 3 refills | Status: DC

## 2021-12-05 MED ORDER — SERTRALINE HCL 100 MG PO TABS: ORAL_TABLET | ORAL | 1 refills | Status: DC

## 2021-12-05 MED ORDER — SIMVASTATIN 40 MG PO TABS: 40.0000 mg | ORAL_TABLET | Freq: Every day | ORAL | 3 refills | Status: DC

## 2021-12-05 MED ORDER — LOSARTAN POTASSIUM-HCTZ 100-25 MG PO TABS: 1.0000 | ORAL_TABLET | Freq: Every day | ORAL | 3 refills | Status: DC

## 2021-12-05 NOTE — Assessment & Plan Note (Signed)
Per ortho.  

## 2021-12-05 NOTE — Patient Instructions (Signed)

## 2021-12-05 NOTE — Assessment & Plan Note (Signed)
Encourage heart healthy diet such as MIND or DASH diet, increase exercise, avoid trans fats, simple carbohydrates and processed foods, consider a krill or fish or flaxseed oil cap daily.  °

## 2021-12-05 NOTE — Assessment & Plan Note (Signed)
Stable on zoloft 

## 2021-12-05 NOTE — Progress Notes (Signed)
Subjective:   By signing my name below, I, Glenda Lyons, attest that this documentation has been prepared under the direction and in the presence of Glenda Held, DO. 12/05/2021    Patient ID: Glenda Lyons, female    DOB: September 11, 1945, 76 y.o.   MRN: [id number]  Chief Complaint  Patient presents with   Hypertension   Hyperlipidemia   Follow-up    Hypertension Pertinent negatives include no blurred vision, chest pain, headaches, malaise/fatigue, palpitations or shortness of breath.  Hyperlipidemia Pertinent negatives include no chest pain or shortness of breath.   Patient is in today for a follow up visit.   She is requesting a refill for 15 mg meloxicam, 100 mg sertraline, 100-25 mg losartan-hydrochlorothiazide, 40 mg simvastatin.  She continues having back pain. She reports developing bursitis March 2023 and seen a orthopedist specialist and was given prednisone to manage her symptoms. She is planning on seeing them for her back pain. She is taking aleve, aspirin, and tylenol to manage her back pain.  She is UTD on flu vaccines this year. She has received the RSV vaccine this year. She has received her first shingrix vaccine this year. She has received a Covid-19 booster vaccine in July, 2023. She is due for the tetanus vaccine and is interested in receiving it at a later date.  Her blood pressure is doing well during this visit. She continues taking 100-25 mg losartan-hydrochlorothiazide daily PO and reports no new issues while taking it. She denies having any swelling in her ankles.  BP Readings from Last 3 Encounters:  12/05/21 118/80  01/14/21 (!) 179/68  11/20/20 120/86   Pulse Readings from Last 3 Encounters:  12/05/21 70  01/14/21 67  11/20/20 66   She has an upcomming appointment with her eye doctor this year.    Past Medical History:  Diagnosis Date   Acute bacterial sinusitis 12/26/2014   Allergy    seasonal   Bilateral conjunctivitis 12/26/2014    Bursitis    BURSITIS, RIGHT HIP 07/10/2006   Qualifier: Diagnosis of  By: Jerold Coombe     Chronic midline low back pain without sciatica 05/02/2019   COLONIC POLYPS, HX OF 03/13/2010   Qualifier: Diagnosis of  By: Jerold Coombe     Depression    Depression with anxiety 04/13/2020   Depression, major, single episode, mild ([place]) 07/10/2006   Qualifier: Diagnosis of  By: Jerold Coombe     Dysuria 06/14/2010   Esodeviation 12/19/2010   Essential hypertension 07/10/2006   Qualifier: Diagnosis of  By: Jerold Coombe     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   Herniated disc    HERNIATED DISC 07/10/2006   Qualifier: Diagnosis of  By: Jerold Coombe     Hyperlipemia    Hyperlipidemia 06/03/2007   Qualifier: Diagnosis of  By: Jerold Coombe     Hypertension    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   Neuromuscular disorder ([place])    New onset left bundle branch block (LBBB) 01/25/2019   Osteoarthritis    OSTEOARTHRITIS 06/03/2007   Qualifier: Diagnosis of  By: Jerold Coombe     Palpitation 01/25/2019   POSTMENOPAUSAL STATUS 12/29/2006   Qualifier: Diagnosis of  By: Jerold Coombe     Sciatica    Seborrheic keratoses 07/08/2012   Skin laxity 08/20/2013   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   Status post lumbar spinal fusion 12/21/2014  Thyroid eye disease 08/21/2011   UNSPECIFIED PERIPHERAL VASCULAR DISEASE 12/29/2006   Qualifier: Diagnosis of  By: Jerold Coombe      Past Surgical History:  Procedure Laterality Date   BIOPSY BREAST     BREAST BIOPSY Right    CYSTECTOMY     leg   DILATION AND CURETTAGE OF UTERUS  09/11/2010   Procedure: DILATATION AND CURETTAGE (D&C);  Surgeon: Betsy Coder, MD;  Location: [facility];  Service: Gynecology;  Laterality: N/A;   ENDOMETRIAL BIOPSY     HYSTEROSCOPY WITH RESECTOSCOPE  09/11/2010   Procedure: HYSTEROSCOPY WITH RESECTOSCOPE;  Surgeon: Betsy Coder, MD;  Location: [facility];   Service: Gynecology;  Laterality: N/A;   LEEP  1994   TONSILLECTOMY      Family History  Problem Relation Age of Onset   Breast cancer Mother    Heart attack Other    Lymphoma Other    Stroke Cousin    Hypertension Cousin    Stroke Cousin    Hypertension Cousin    Colon cancer Neg Hx     Social History   Socioeconomic History   Marital status: Married    Spouse name: Not on file   Number of children: Not on file   Years of education: Not on file   Highest education level: Not on file  Occupational History   Not on file  Tobacco Use   Smoking status: Never   Smokeless tobacco: Never  Vaping Use   Vaping Use: Never used  Substance and Sexual Activity   Alcohol use: Yes    Comment: occasionally   Drug use: No   Sexual activity: Yes    Partners: Male  Other Topics Concern   Not on file  Social History Narrative   Exercise-- just walking up and down the stairs at home   Social Determinants of Health   Financial Resource Strain: Not on file  Food Insecurity: Not on file  Transportation Needs: Not on file  Physical Activity: Not on file  Stress: Not on file  Social Connections: Not on file  Intimate Partner Violence: Not on file    Outpatient Medications Prior to Visit  Medication Sig Dispense Refill   aspirin 81 MG tablet Take 81 mg by mouth daily.     Calcium Carb-Cholecalciferol (CALCIUM-VITAMIN D) 500-400 MG-UNIT TABS Take by mouth 2 (two) times daily.     fluticasone (FLONASE) 50 MCG/ACT nasal spray instill 2 sprays into each nostril at bedtime 16 g 2   VITAMIN A PO Take 3,000 mg by mouth daily.     vitamin C (ASCORBIC ACID) 250 MG tablet Take 250 mg by mouth daily.     losartan-hydrochlorothiazide (HYZAAR) 100-25 MG tablet Take 1 tablet by mouth daily. Needs appointment for more refills 30 tablet 0   meloxicam (MOBIC) 15 MG tablet Take 0.5-1 tablets (7.5-15 mg total) by mouth daily. 30 tablet 0   sertraline (ZOLOFT) 100 MG tablet Take 1&1/2 tablets (150  mg total) by mouth daily. 135 tablet 1   simvastatin (ZOCOR) 40 MG tablet Take 1 tablet (40 mg total) by mouth at bedtime. 30 tablet 2   traZODone (DESYREL) 50 MG tablet Take 1/2-1 tablet (25-50 mg total) by mouth at bedtime as needed for sleep. 30 tablet 3   No facility-administered medications prior to visit.    Allergies  Allergen Reactions   Almond Oil Other (See Comments)    Throat raw and scratchy   Other Other (See Comments)  SUTURE MATERIAL- REDNESS, IRRITATION, TOOK A LONG TIME TO HEAL    Review of Systems  Constitutional:  Negative for fever and malaise/fatigue.  HENT:  Negative for congestion.   Eyes:  Negative for blurred vision.  Respiratory:  Negative for cough and shortness of breath.   Cardiovascular:  Negative for chest pain, palpitations and leg swelling.  Gastrointestinal:  Negative for vomiting.  Musculoskeletal:  Negative for back pain.  Skin:  Negative for rash.  Neurological:  Negative for loss of consciousness and headaches.       Objective:    Physical Exam Vitals and nursing note reviewed.  Constitutional:      General: She is not in acute distress.    Appearance: Normal appearance. She is well-developed. She is not ill-appearing.  HENT:     Head: Normocephalic and atraumatic.     Right Ear: External ear normal.     Left Ear: External ear normal.  Eyes:     Extraocular Movements: Extraocular movements intact.     Conjunctiva/sclera: Conjunctivae normal.     Pupils: Pupils are equal, round, and reactive to light.  Neck:     Thyroid: No thyromegaly.     Vascular: No carotid bruit or JVD.  Cardiovascular:     Rate and Rhythm: Normal rate and regular rhythm.     Heart sounds: Normal heart sounds. No murmur heard.    No gallop.  Pulmonary:     Effort: Pulmonary effort is normal. No respiratory distress.     Breath sounds: Normal breath sounds. No wheezing or rales.  Chest:     Chest wall: No tenderness.  Musculoskeletal:     Cervical  back: Normal range of motion and neck supple.     Right lower leg: No edema.     Left lower leg: No edema.  Skin:    General: Skin is warm and dry.  Neurological:     Mental Status: She is alert and oriented to person, place, and time.  Psychiatric:        Judgment: Judgment normal.     BP 118/80 (BP Location: Left Arm, Patient Position: Sitting, Cuff Size: Normal)   Pulse 70   Temp 98.3 F (36.8 C) (Oral)   Resp 16   Ht 5\' 4"  (1.626 m)   Wt 143 lb 3.2 oz (65 kg)   SpO2 98%   BMI 24.58 kg/m  Wt Readings from Last 3 Encounters:  12/05/21 143 lb 3.2 oz (65 kg)  01/14/21 143 lb (64.9 kg)  11/20/20 134 lb 9.6 oz (61.1 kg)    Diabetic Foot Exam - Simple   No data filed    Lab Results  Component Value Date   WBC 6.5 11/20/2020   HGB 13.7 11/20/2020   HCT 41.6 11/20/2020   PLT 203.0 11/20/2020   GLUCOSE 94 11/20/2020   CHOL 184 11/20/2020   TRIG 71.0 11/20/2020   HDL 72.00 11/20/2020   LDLCALC 97 11/20/2020   ALT 17 11/20/2020   AST 22 11/20/2020   NA 141 11/20/2020   K 3.8 11/20/2020   CL 103 11/20/2020   CREATININE 0.75 11/20/2020   BUN 28 (H) 11/20/2020   CO2 28 11/20/2020   TSH 3.50 01/25/2019   HGBA1C 6.1 (H) 12/15/2005    Lab Results  Component Value Date   TSH 3.50 01/25/2019   Lab Results  Component Value Date   WBC 6.5 11/20/2020   HGB 13.7 11/20/2020   HCT 41.6 11/20/2020   MCV 95.2  11/20/2020   PLT 203.0 11/20/2020   Lab Results  Component Value Date   NA 141 11/20/2020   K 3.8 11/20/2020   CO2 28 11/20/2020   GLUCOSE 94 11/20/2020   BUN 28 (H) 11/20/2020   CREATININE 0.75 11/20/2020   BILITOT 0.6 11/20/2020   ALKPHOS 57 11/20/2020   AST 22 11/20/2020   ALT 17 11/20/2020   PROT 6.9 11/20/2020   ALBUMIN 4.5 11/20/2020   CALCIUM 9.8 11/20/2020   GFR 77.65 11/20/2020   Lab Results  Component Value Date   CHOL 184 11/20/2020   Lab Results  Component Value Date   HDL 72.00 11/20/2020   Lab Results  Component Value Date    LDLCALC 97 11/20/2020   Lab Results  Component Value Date   TRIG 71.0 11/20/2020   Lab Results  Component Value Date   CHOLHDL 3 11/20/2020   Lab Results  Component Value Date   HGBA1C 6.1 (H) 12/15/2005       Assessment & Plan:   Problem List Items Addressed This Visit       Unprioritized   Depression, major, single episode, mild (HCC)   Relevant Medications   sertraline (ZOLOFT) 100 MG tablet   Essential hypertension   Relevant Medications   losartan-hydrochlorothiazide (HYZAAR) 100-25 MG tablet   simvastatin (ZOCOR) 40 MG tablet   Other Relevant Orders   CBC with Differential/Platelet   Comprehensive metabolic panel   Lipid panel   Hyperlipidemia   Relevant Medications   losartan-hydrochlorothiazide (HYZAAR) 100-25 MG tablet   simvastatin (ZOCOR) 40 MG tablet   Other Relevant Orders   CBC with Differential/Platelet   Comprehensive metabolic panel   Lipid panel   Hypertension - Primary    Well controlled, no changes to meds. Encouraged heart healthy diet such as the DASH diet and exercise as tolerated.        Relevant Medications   losartan-hydrochlorothiazide (HYZAAR) 100-25 MG tablet   simvastatin (ZOCOR) 40 MG tablet   Depression with anxiety    Stable on zoloft      Relevant Medications   sertraline (ZOLOFT) 100 MG tablet   Bilateral hip pain    Per ortho      Other Visit Diagnoses     Acute low back pain with sciatica, sciatica laterality unspecified, unspecified back pain laterality       Relevant Medications   meloxicam (MOBIC) 15 MG tablet   sertraline (ZOLOFT) 100 MG tablet        Meds ordered this encounter  Medications   losartan-hydrochlorothiazide (HYZAAR) 100-25 MG tablet    Sig: Take 1 tablet by mouth daily. Needs appointment for more refills    Dispense:  90 tablet    Refill:  3    This prescription was filled on 09/04/2021. Any refills authorized will be placed on file.   meloxicam (MOBIC) 15 MG tablet    Sig: Take 0.5-1  tablets (7.5-15 mg total) by mouth daily.    Dispense:  90 tablet    Refill:  3    Requested drug refills are authorized, however, the patient needs further evaluation and/or laboratory testing before further refills are given. Ask her to make an appointment for this.   simvastatin (ZOCOR) 40 MG tablet    Sig: Take 1 tablet (40 mg total) by mouth at bedtime.    Dispense:  90 tablet    Refill:  3   sertraline (ZOLOFT) 100 MG tablet    Sig: Take 1&1/2 tablets (150 mg  total) by mouth daily.    Dispense:  135 tablet    Refill:  1    I, Glenda Held, DO, personally preformed the services described in this documentation.  All medical record entries made by the scribe were at my direction and in my presence.  I have reviewed the chart and discharge instructions (if applicable) and agree that the record reflects my personal performance and is accurate and complete. 12/05/2021   I,Glenda Lyons,acting as a scribe for Glenda Held, DO.,have documented all relevant documentation on the behalf of Glenda Held, DO,as directed by  Glenda Held, DO while in the presence of Glenda Held, DO.   Glenda Held, DO

## 2021-12-05 NOTE — Assessment & Plan Note (Signed)
Well controlled, no changes to meds. Encouraged heart healthy diet such as the DASH diet and exercise as tolerated.  °

## 2021-12-06 LAB — CBC WITH DIFFERENTIAL/PLATELET
Basophils Absolute: 0.1 10*3/uL (ref 0.0–0.1)
Basophils Relative: 0.9 % (ref 0.0–3.0)
Eosinophils Absolute: 0.2 10*3/uL (ref 0.0–0.7)
Eosinophils Relative: 2.5 % (ref 0.0–5.0)
HCT: 40.9 % (ref 36.0–46.0)
Hemoglobin: 13.7 g/dL (ref 12.0–15.0)
Lymphocytes Relative: 21.9 % (ref 12.0–46.0)
Lymphs Abs: 1.5 10*3/uL (ref 0.7–4.0)
MCHC: 33.6 g/dL (ref 30.0–36.0)
MCV: 94.8 fl (ref 78.0–100.0)
Monocytes Absolute: 0.4 10*3/uL (ref 0.1–1.0)
Monocytes Relative: 6.5 % (ref 3.0–12.0)
Neutro Abs: 4.6 10*3/uL (ref 1.4–7.7)
Neutrophils Relative %: 68.2 % (ref 43.0–77.0)
Platelets: 212 10*3/uL (ref 150.0–400.0)
RBC: 4.31 Mil/uL (ref 3.87–5.11)
RDW: 13.1 % (ref 11.5–15.5)
WBC: 6.7 10*3/uL (ref 4.0–10.5)

## 2021-12-06 LAB — LIPID PANEL
Cholesterol: 203 mg/dL — ABNORMAL HIGH (ref 0–200)
HDL: 78.1 mg/dL (ref >39.00)
LDL Cholesterol: 113 mg/dL — ABNORMAL HIGH (ref 0–99)
NonHDL: 124.53
Total CHOL/HDL Ratio: 3
Triglycerides: 58 mg/dL (ref 0.0–149.0)
VLDL: 11.6 mg/dL (ref 0.0–40.0)

## 2021-12-06 LAB — COMPREHENSIVE METABOLIC PANEL
ALT: 19 U/L (ref 0–35)
AST: 20 U/L (ref 0–37)
Albumin: 4.4 g/dL (ref 3.5–5.2)
Alkaline Phosphatase: 56 U/L (ref 39–117)
BUN: 26 mg/dL — ABNORMAL HIGH (ref 6–23)
CO2: 27 mEq/L (ref 19–32)
Calcium: 9.5 mg/dL (ref 8.4–10.5)
Chloride: 103 mEq/L (ref 96–112)
Creatinine, Ser: 0.68 mg/dL (ref 0.40–1.20)
GFR: 84.33 mL/min (ref >60.00)
Glucose, Bld: 90 mg/dL (ref 70–99)
Potassium: 3.9 mEq/L (ref 3.5–5.1)
Sodium: 140 mEq/L (ref 135–145)
Total Bilirubin: 0.7 mg/dL (ref 0.2–1.2)
Total Protein: 6.9 g/dL (ref 6.0–8.3)

## 2022-01-01 ENCOUNTER — Encounter: Payer: Self-pay | Admitting: Cardiology

## 2022-01-01 ENCOUNTER — Ambulatory Visit: Payer: Medicare Other | Attending: Cardiology | Admitting: Cardiology

## 2022-01-01 VITALS — BP 128/86 | HR 62 | Ht 64.0 in | Wt 144.2 lb

## 2022-01-01 DIAGNOSIS — E782 Mixed hyperlipidemia: Secondary | ICD-10-CM

## 2022-01-01 DIAGNOSIS — I10 Essential (primary) hypertension: Secondary | ICD-10-CM | POA: Diagnosis not present

## 2022-01-01 NOTE — Patient Instructions (Signed)
Medication Instructions:  Your physician recommends that you continue on your current medications as directed. Please refer to the Current Medication list given to you today.  *If you need a refill on your cardiac medications before your next appointment, please call your pharmacy*   Lab Work: NONE If you have labs (blood work) drawn today and your tests are completely normal, you will receive your results only by: MyChart Message (if you have MyChart) OR A paper copy in the mail If you have any lab test that is abnormal or we need to change your treatment, we will call you to review the results.   Testing/Procedures: NONE   Follow-Up: At [facility], you and your health needs are our priority.  As part of our continuing mission to provide you with exceptional heart care, we have created designated Provider Care Teams.  These Care Teams include your primary Cardiologist (physician) and Advanced Practice Providers (APPs -  Physician Assistants and Nurse Practitioners) who all work together to provide you with the care you need, when you need it.  We recommend signing up for the patient portal called "MyChart".  Sign up information is provided on this After Visit Summary.  MyChart is used to connect with patients for Virtual Visits (Telemedicine).  Patients are able to view lab/test results, encounter notes, upcoming appointments, etc.  Non-urgent messages can be sent to your provider as well.   To learn more about what you can do with MyChart, go to [email or web address].    Your next appointment:   1 year(s)  The format for your next appointment:   In Person  Provider:   Kardie Tobb, DO   

## 2022-01-01 NOTE — Progress Notes (Signed)
Cardiology Office Note:    Date:  01/03/2022   ID:  Glenda Lyons, DOB 1945/09/25, MRN [id number]  PCP:  Zola Button, Grayling Congress, DO  Cardiologist:  Thomasene Ripple, DO  Electrophysiologist:  None   Referring MD: Zola Button, Grayling Congress, *   " I am doing fine my has been started to get better"  History of Present Illness:    Glenda Lyons is a 76 y.o. female with a hx of hypertension, left bundle branch block is here today for follow-up visit.  I last saw the patient on March 2021 at that time she appeared to be doing well from a cardiovascular standpoint.  At her last visit she was doing well from a cardiovascular standpoint.  No changes were made.  She offers no complaints at this time.  She is doing good.  She is very happy that her husband has been recovering from his cancer as well.  Past Medical History:  Diagnosis Date   Acute bacterial sinusitis 12/26/2014   Allergy    seasonal   Bilateral conjunctivitis 12/26/2014   Bursitis    BURSITIS, RIGHT HIP 07/10/2006   Qualifier: Diagnosis of  By: Janit Bern     Chronic midline low back pain without sciatica 05/02/2019   COLONIC POLYPS, HX OF 03/13/2010   Qualifier: Diagnosis of  By: Janit Bern     Depression    Depression with anxiety 04/13/2020   Depression, major, single episode, mild (HCC) 07/10/2006   Qualifier: Diagnosis of  By: Janit Bern     Dysuria 06/14/2010   Esodeviation 12/19/2010   Essential hypertension 07/10/2006   Qualifier: Diagnosis of  By: Janit Bern     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   Herniated disc    HERNIATED DISC 07/10/2006   Qualifier: Diagnosis of  By: Janit Bern     Hyperlipemia    Hyperlipidemia 06/03/2007   Qualifier: Diagnosis of  By: Janit Bern     Hypertension    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   Neuromuscular disorder (HCC)    New onset left bundle branch block (LBBB) 01/25/2019   Osteoarthritis    OSTEOARTHRITIS 06/03/2007    Qualifier: Diagnosis of  By: Janit Bern     Palpitation 01/25/2019   POSTMENOPAUSAL STATUS 12/29/2006   Qualifier: Diagnosis of  By: Janit Bern     Sciatica    Seborrheic keratoses 07/08/2012   Skin laxity 08/20/2013   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   Status post lumbar spinal fusion 12/21/2014   Thyroid eye disease 08/21/2011   UNSPECIFIED PERIPHERAL VASCULAR DISEASE 12/29/2006   Qualifier: Diagnosis of  By: Janit Bern      Past Surgical History:  Procedure Laterality Date   BIOPSY BREAST     BREAST BIOPSY Right    CYSTECTOMY     leg   DILATION AND CURETTAGE OF UTERUS  09/11/2010   Procedure: DILATATION AND CURETTAGE (D&C);  Surgeon: Michael Litter, MD;  Location: [facility];  Service: Gynecology;  Laterality: N/A;   ENDOMETRIAL BIOPSY     HYSTEROSCOPY WITH RESECTOSCOPE  09/11/2010   Procedure: HYSTEROSCOPY WITH RESECTOSCOPE;  Surgeon: Michael Litter, MD;  Location: [facility];  Service: Gynecology;  Laterality: N/A;   LEEP  1994   TONSILLECTOMY      Current Medications: Current Meds  Medication Sig   aspirin 81 MG tablet Take 81 mg by mouth daily.  Calcium Carb-Cholecalciferol (CALCIUM-VITAMIN D) 500-400 MG-UNIT TABS Take by mouth 2 (two) times daily.   fluticasone (FLONASE) 50 MCG/ACT nasal spray instill 2 sprays into each nostril at bedtime   losartan-hydrochlorothiazide (HYZAAR) 100-25 MG tablet Take 1 tablet by mouth daily. Needs appointment for more refills   meloxicam (MOBIC) 15 MG tablet Take 0.5-1 tablets (7.5-15 mg total) by mouth daily.   sertraline (ZOLOFT) 100 MG tablet Take 1&1/2 tablets (150 mg total) by mouth daily.   simvastatin (ZOCOR) 40 MG tablet Take 1 tablet (40 mg total) by mouth at bedtime.   VITAMIN A PO Take 3,000 mg by mouth daily.   vitamin C (ASCORBIC ACID) 250 MG tablet Take 250 mg by mouth daily.     Allergies:   Almond oil and Other   Social History   Socioeconomic History   Marital  status: Married    Spouse name: Not on file   Number of children: Not on file   Years of education: Not on file   Highest education level: Not on file  Occupational History   Not on file  Tobacco Use   Smoking status: Never   Smokeless tobacco: Never  Vaping Use   Vaping Use: Never used  Substance and Sexual Activity   Alcohol use: Yes    Comment: occasionally   Drug use: No   Sexual activity: Yes    Partners: Male  Other Topics Concern   Not on file  Social History Narrative   Exercise-- just walking up and down the stairs at home   Social Determinants of Health   Financial Resource Strain: Not on file  Food Insecurity: Not on file  Transportation Needs: Not on file  Physical Activity: Not on file  Stress: Not on file  Social Connections: Not on file     Family History: The patient's family history includes Breast cancer in her mother; Heart attack in an other family member; Hypertension in her cousin and cousin; Lymphoma in an other family member; Stroke in her cousin and cousin. There is no history of Colon cancer.  ROS:   Review of Systems  Constitution: Negative for decreased appetite, fever and weight gain.  HENT: Negative for congestion, ear discharge, hoarse voice and sore throat.   Eyes: Negative for discharge, redness, vision loss in right eye and visual halos.  Cardiovascular: Negative for chest pain, dyspnea on exertion, leg swelling, orthopnea and palpitations.  Respiratory: Negative for cough, hemoptysis, shortness of breath and snoring.   Endocrine: Negative for heat intolerance and polyphagia.  Hematologic/Lymphatic: Negative for bleeding problem. Does not bruise/bleed easily.  Skin: Negative for flushing, nail changes, rash and suspicious lesions.  Musculoskeletal: Negative for arthritis, joint pain, muscle cramps, myalgias, neck pain and stiffness.  Gastrointestinal: Negative for abdominal pain, bowel incontinence, diarrhea and excessive appetite.   Genitourinary: Negative for decreased libido, genital sores and incomplete emptying.  Neurological: Negative for brief paralysis, focal weakness, headaches and loss of balance.  Psychiatric/Behavioral: Negative for altered mental status, depression and suicidal ideas.  Allergic/Immunologic: Negative for HIV exposure and persistent infections.    EKGs/Labs/Other Studies Reviewed:    The following studies were reviewed today:   EKG: None   Pharmacologic nuclear stress test: The left ventricular ejection fraction is hyperdynamic (>65%). Nuclear stress EF: 73%. There was no ST segment deviation noted during stress. No T wave inversion was noted during stress. The study is normal. This is a low risk study.   1. Normal myocardial perfusion imaging study without evidence  of ischemia or infarction.  2. Patient developed a rate-related LBBB that resolved (developed ~90 bpm).  3. Normal LVEF, >65%.  4. Low-risk study.      Transthoracic echocardiogram 02/01/2019 IMPRESSIONS   1. Left ventricular ejection fraction, by visual estimation, is 60 to  65%. The left ventricle has normal function. There is no left ventricular  hypertrophy.   2. The left ventricle has no regional wall motion abnormalities.   3. 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   FINDINGS   Left Ventricle: Left ventricular ejection fraction, by visual estimation,  is 60 to 65%. The left ventricle has normal function. The left ventricle  has no regional wall motion abnormalities. There is no left ventricular  hypertrophy. Normal left atrial  pressure.   Right Ventricle: The right ventricular size is normal. No increase in  right ventricular wall thickness. Global RV systolic function is has  normal systolic function. The tricuspid regurgitant velocity is 2.13 m/s,  and with an assumed right atrial pressure   of 3 mmHg, the estimated right ventricular systolic pressure is normal at   21.1 mmHg.   Left Atrium: Left atrial size was normal in size.   Right Atrium: Right atrial size was normal in size   Pericardium: There is no evidence of pericardial effusion.   Mitral Valve: The mitral valve is normal in structure. No evidence of  mitral valve regurgitation. No evidence of mitral valve stenosis by  observation.   Tricuspid Valve: The tricuspid valve is normal in structure. Tricuspid  valve regurgitation is mild.   Aortic Valve: The aortic valve is normal in structure. Aortic valve  regurgitation is not visualized. The aortic valve is structurally normal,  with no evidence of sclerosis or stenosis.   Pulmonic Valve: The pulmonic valve was normal in structure. Pulmonic valve  regurgitation is not visualized. Pulmonic regurgitation is not visualized.   Aorta: The aortic root, ascending aorta and aortic arch are all  structurally normal, with no evidence of dilitation or obstruction. 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   Venous: The inferior vena cava is normal in size with greater than 50%  respiratory variability, suggesting right atrial pressure of 3 mmHg.   IAS/Shunts: No atrial level shunt detected by color flow Doppler. There is  no evidence of a patent foramen ovale. No ventricular septal defect is  seen or detected. There is no evidence of an atrial septal defect  Recent Labs: 12/05/2021: ALT 19; BUN 26; Creatinine, Ser 0.68; Hemoglobin 13.7; Platelets 212.0; Potassium 3.9; Sodium 140  Recent Lipid Panel    Component Value Date/Time   CHOL 203 (H) 12/05/2021 1521   TRIG 58.0 12/05/2021 1521   HDL 78.10 12/05/2021 1521   CHOLHDL 3 12/05/2021 1521   VLDL 11.6 12/05/2021 1521   LDLCALC 113 (H) 12/05/2021 1521    Physical Exam:    VS:  BP 128/86   Pulse 62   Ht 5\' 4"  (1.626 m)   Wt 144 lb 3.2 oz (65.4 kg)   SpO2 98%   BMI 24.75 kg/m     Wt Readings from Last 3 Encounters:  01/01/22 144 lb 3.2 oz (65.4  kg)  12/05/21 143 lb 3.2 oz (65 kg)  01/14/21 143 lb (64.9 kg)     GEN: Well nourished, well developed in no acute distress HEENT: Normal NECK: No JVD; No carotid bruits LYMPHATICS: No lymphadenopathy CARDIAC: S1S2 noted,RRR, no murmurs, rubs, gallops RESPIRATORY:  Clear to auscultation without rales, wheezing or rhonchi  ABDOMEN: Soft, non-tender, non-distended, +bowel sounds, no guarding. EXTREMITIES: No edema, No cyanosis, no clubbing MUSCULOSKELETAL:  No deformity  SKIN: Warm and dry NEUROLOGIC:  Alert and oriented x 3, non-focal PSYCHIATRIC:  Normal affect, good insight  ASSESSMENT:    1. Essential hypertension   2. Mixed hyperlipidemia     PLAN:     She is doing well from a CV standpoint,  No changes to her medication regimen.   Blood pressure is acceptable, continue with current antihypertensive regimen. Hyperlipidemia - continue with current statin medication.  The patient is in agreement with the above plan. The patient left the office in stable condition.  The patient will follow up in   Medication Adjustments/Labs and Tests Ordered: Current medicines are reviewed at length with the patient today.  Concerns regarding medicines are outlined above.  Orders Placed This Encounter  Procedures   EKG 12-Lead   No orders of the defined types were placed in this encounter.   Patient Instructions  Medication Instructions:  Your physician recommends that you continue on your current medications as directed. Please refer to the Current Medication list given to you today.  *If you need a refill on your cardiac medications before your next appointment, please call your pharmacy*   Lab Work: NONE If you have labs (blood work) drawn today and your tests are completely normal, you will receive your results only by: MyChart Message (if you have MyChart) OR A paper copy in the mail If you have any lab test that is abnormal or we need to change your treatment, we will  call you to review the results.   Testing/Procedures: NONE   Follow-Up: At [facility], you and your health needs are our priority.  As part of our continuing mission to provide you with exceptional heart care, we have created designated Provider Care Teams.  These Care Teams include your primary Cardiologist (physician) and Advanced Practice Providers (APPs -  Physician Assistants and Nurse Practitioners) who all work together to provide you with the care you need, when you need it.  We recommend signing up for the patient portal called "MyChart".  Sign up information is provided on this After Visit Summary.  MyChart is used to connect with patients for Virtual Visits (Telemedicine).  Patients are able to view lab/test results, encounter notes, upcoming appointments, etc.  Non-urgent messages can be sent to your provider as well.   To learn more about what you can do with MyChart, go to [email or web address].    Your next appointment:   1 year(s)  The format for your next appointment:   In Person  Provider:   Thomasene Ripple, DO     Adopting a Healthy Lifestyle.  Know what a healthy weight is for you (roughly BMI <25) and aim to maintain this   Aim for 7+ servings of fruits and vegetables daily   65-80+ fluid ounces of water or unsweet tea for healthy kidneys   Limit to max 1 drink of alcohol per day; avoid smoking/tobacco   Limit animal fats in diet for cholesterol and heart health - choose grass fed whenever available   Avoid highly processed foods, and foods high in saturated/trans fats   Aim for low stress - take time to unwind and care for your mental health   Aim for 150 min of moderate intensity exercise weekly for heart health, and weights twice weekly for bone health   Aim for  7-9 hours of sleep daily   When it comes to diets, agreement about the perfect plan isnt easy to find, even among the experts. Experts at the [employer]  developed an idea known as the Healthy Eating Plate. Just imagine a plate divided into logical, healthy portions.   The emphasis is on diet quality:   Load up on vegetables and fruits - one-half of your plate: Aim for color and variety, and remember that potatoes dont count.   Go for whole grains - one-quarter of your plate: Whole wheat, barley, wheat berries, quinoa, oats, brown rice, and foods made with them. If you want pasta, go with whole wheat pasta.   Protein power - one-quarter of your plate: Fish, chicken, beans, and nuts are all healthy, versatile protein sources. Limit red meat.   The diet, however, does go beyond the plate, offering a few other suggestions.   Use healthy plant oils, such as olive, canola, soy, corn, sunflower and peanut. Check the labels, and avoid partially hydrogenated oil, which have unhealthy trans fats.   If youre thirsty, drink water. Coffee and tea are good in moderation, but skip sugary drinks and limit milk and dairy products to one or two daily servings.   The type of carbohydrate in the diet is more important than the amount. Some sources of carbohydrates, such as vegetables, fruits, whole grains, and beans-are healthier than others.   Finally, stay active  Signed, Thomasene RippleKardie Mahmud Keithly, DO  01/03/2022 12:11 PM    [facility]

## 2022-01-23 ENCOUNTER — Telehealth: Payer: Self-pay | Admitting: Family Medicine

## 2022-01-23 NOTE — Telephone Encounter (Signed)
Pneumo 20?

## 2022-01-23 NOTE — Telephone Encounter (Signed)
Pt called to verify which pneumococcal vaccine she should get from the pharmacy. Please Advise.

## 2022-01-27 NOTE — Telephone Encounter (Signed)
Pt made aware

## 2022-02-01 ENCOUNTER — Ambulatory Visit
Admission: EM | Admit: 2022-02-01 | Discharge: 2022-02-01 | Disposition: A | Discharge status: Home / Self Care | Payer: Medicare Other | Attending: Family Medicine | Admitting: Family Medicine

## 2022-02-01 ENCOUNTER — Ambulatory Visit (INDEPENDENT_AMBULATORY_CARE_PROVIDER_SITE_OTHER): Payer: Medicare Other

## 2022-02-01 DIAGNOSIS — M898X1 Other specified disorders of bone, shoulder: Secondary | ICD-10-CM

## 2022-02-01 DIAGNOSIS — M25511 Pain in right shoulder: Secondary | ICD-10-CM

## 2022-02-01 MED ORDER — METHYLPREDNISOLONE 4 MG PO TBPK: ORAL_TABLET | ORAL | 0 refills | Status: DC

## 2022-02-01 NOTE — ED Provider Notes (Signed)
[facility]    CSN: [id number] Arrival date & time: 02/01/22  0845      History   Chief Complaint Chief Complaint  Patient presents with   Shoulder Pain    RT x 3 weeks    HPI Glenda Lyons is a 76 y.o. female.   HPI  Patient states that she was pulling on a starter cord for her mower that had not been run for a while.  She had to pull several times.  She used her right arm.  She did not feel pain at the time but later that day noticed she had pain in her shoulder.  It has been painful ever since then.  Been using ice, taking Tylenol, and is on meloxicam.  This has not helped  Past Medical History:  Diagnosis Date   Acute bacterial sinusitis 12/26/2014   Allergy    seasonal   Bilateral conjunctivitis 12/26/2014   Bursitis    BURSITIS, RIGHT HIP 07/10/2006   Qualifier: Diagnosis of  By: Janit BernLowne DO, [place] Warriner     Chronic midline low back pain without sciatica 05/02/2019   COLONIC POLYPS, HX OF 03/13/2010   Qualifier: Diagnosis of  By: Janit BernLowne DO, Mekai Wilkinson     Depression    Depression with anxiety 04/13/2020   Depression, major, single episode, mild (HCC) 07/10/2006   Qualifier: Diagnosis of  By: Janit BernLowne DO, Ruthia Person     Dysuria 06/14/2010   Esodeviation 12/19/2010   Essential hypertension 07/10/2006   Qualifier: Diagnosis of  By: Janit BernLowne DO, Shila Kruczek     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   Herniated disc    HERNIATED DISC 07/10/2006   Qualifier: Diagnosis of  By: Janit BernLowne DO, Dyland Panuco     Hyperlipemia    Hyperlipidemia 06/03/2007   Qualifier: Diagnosis of  By: Janit BernLowne DO, Laneta Guerin     Hypertension    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   Neuromuscular disorder (HCC)    New onset left bundle branch block (LBBB) 01/25/2019   Osteoarthritis    OSTEOARTHRITIS 06/03/2007   Qualifier: Diagnosis of  By: Janit BernLowne DO, Nemesio Castrillon     Palpitation 01/25/2019   POSTMENOPAUSAL STATUS 12/29/2006   Qualifier: Diagnosis of  By: Janit BernLowne DO, Kehinde Totzke     Sciatica    Seborrheic keratoses  07/08/2012   Skin laxity 08/20/2013   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   Status post lumbar spinal fusion 12/21/2014   Thyroid eye disease 08/21/2011   UNSPECIFIED PERIPHERAL VASCULAR DISEASE 12/29/2006   Qualifier: Diagnosis of  By: Janit BernLowne DO, Marilynn Ekstein      Patient Active Problem List   Diagnosis Date Noted   Rheumatoid factor positive 01/14/2021   Bilateral hip pain 01/14/2021   Preventative health care 11/20/2020   Allergy    Depression    Hyperlipemia    Hypertension    Neuromuscular disorder (HCC)    Osteoarthritis    Sciatica    Depression with anxiety 04/13/2020   Chronic midline low back pain without sciatica 05/02/2019   Palpitation 01/25/2019   New onset left bundle branch block (LBBB) 01/25/2019   Acute bacterial sinusitis 12/26/2014   Bilateral conjunctivitis 12/26/2014   Status post lumbar spinal fusion 12/21/2014   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   Skin laxity 08/20/2013   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   Seborrheic keratoses 07/08/2012   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   Thyroid eye disease 08/21/2011   Esodeviation 12/19/2010  Dysuria 06/14/2010   COLONIC POLYPS, HX OF 03/13/2010   Hyperlipidemia 06/03/2007   OSTEOARTHRITIS 06/03/2007   UNSPECIFIED PERIPHERAL VASCULAR DISEASE 12/29/2006   POSTMENOPAUSAL STATUS 12/29/2006   Depression, major, single episode, mild (HCC) 07/10/2006   Essential hypertension 07/10/2006   HERNIATED DISC 07/10/2006   BURSITIS, RIGHT HIP 07/10/2006    Past Surgical History:  Procedure Laterality Date   BIOPSY BREAST     BREAST BIOPSY Right    CYSTECTOMY     leg   DILATION AND CURETTAGE OF UTERUS  09/11/2010   Procedure: DILATATION AND CURETTAGE (D&C);  Surgeon: Michael Litter, MD;  Location: [facility];  Service: Gynecology;  Laterality: N/A;   ENDOMETRIAL BIOPSY     HYSTEROSCOPY WITH RESECTOSCOPE  09/11/2010   Procedure:  HYSTEROSCOPY WITH RESECTOSCOPE;  Surgeon: Michael Litter, MD;  Location: [facility];  Service: Gynecology;  Laterality: N/A;   LEEP  1994   TONSILLECTOMY      OB History     Gravida  0   Para      Term      Preterm      AB      Living         SAB      IAB      Ectopic      Multiple      Live Births               Home Medications    Prior to Admission medications   Medication Sig Start Date End Date Taking? Authorizing Provider  methylPREDNISolone (MEDROL DOSEPAK) 4 MG TBPK tablet tad 02/01/22  Yes Eustace Moore, MD  aspirin 81 MG tablet Take 81 mg by mouth daily.    [provider]  Calcium Carb-Cholecalciferol (CALCIUM-VITAMIN D) 500-400 MG-UNIT TABS Take by mouth 2 (two) times daily.    [provider]  fluticasone Aleda Grana) 50 MCG/ACT nasal spray instill 2 sprays into each nostril at bedtime 02/11/13   Zola Button, Grayling Congress, DO  losartan-hydrochlorothiazide (HYZAAR) 100-25 MG tablet Take 1 tablet by mouth daily. Needs appointment for more refills 12/05/21   Zola Button, Grayling Congress, DO  meloxicam (MOBIC) 15 MG tablet Take 0.5-1 tablets (7.5-15 mg total) by mouth daily. 12/05/21   Donato Schultz, DO  sertraline (ZOLOFT) 100 MG tablet Take 1&1/2 tablets (150 mg total) by mouth daily. 12/05/21   Donato Schultz, DO  simvastatin (ZOCOR) 40 MG tablet Take 1 tablet (40 mg total) by mouth at bedtime. 12/05/21   Donato Schultz, DO  VITAMIN A PO Take 3,000 mg by mouth daily.    [provider]  vitamin C (ASCORBIC ACID) 250 MG tablet Take 250 mg by mouth daily.    [provider]    Family History Family History  Problem Relation Age of Onset   Breast cancer Mother    Heart attack Other    Lymphoma Other    Stroke Cousin    Hypertension Cousin    Stroke Cousin    Hypertension Cousin    Colon cancer Neg Hx     Social History Social History   Tobacco Use   Smoking status: Never   Smokeless tobacco:  Never  Vaping Use   Vaping Use: Never used  Substance Use Topics   Alcohol use: Yes    Comment: occasionally   Drug use: No     Allergies   Almond oil and Other   Review of Systems  Review of Systems  See HPI Physical Exam Triage Vital Signs ED Triage Vitals  Enc Vitals Group     BP 02/01/22 0913 (!) 187/82     Pulse Rate 02/01/22 0913 65     Resp 02/01/22 0913 17     Temp 02/01/22 0913 97.6 F (36.4 C)     Temp Source 02/01/22 0913 Oral     SpO2 02/01/22 0913 99 %     Weight --      Height --      Head Circumference --      Peak Flow --      Pain Score 02/01/22 0911 0     Pain Loc --      Pain Edu? --      Excl. in GC? --    No data found.  Updated Vital Signs BP (!) 155/81   Pulse 65   Temp 97.6 F (36.4 C) (Oral)   Resp 17   SpO2 99%       Physical Exam Constitutional:      General: She is not in acute distress.    Appearance: She is well-developed.  HENT:     Head: Normocephalic and atraumatic.  Eyes:     Conjunctiva/sclera: Conjunctivae normal.     Pupils: Pupils are equal, round, and reactive to light.  Cardiovascular:     Rate and Rhythm: Normal rate.  Pulmonary:     Effort: Pulmonary effort is normal. No respiratory distress.  Abdominal:     General: There is no distension.     Palpations: Abdomen is soft.  Musculoskeletal:        General: Normal range of motion.     Cervical back: Normal range of motion.       Back:  Skin:    General: Skin is warm and dry.  Neurological:     Mental Status: She is alert.      UC Treatments / Results  Labs (all labs ordered are listed, but only abnormal results are displayed) Labs Reviewed - No data to display  EKG   Radiology DG Shoulder Right  Result Date: 02/01/2022 CLINICAL DATA:  Pain in upper scapula for 3 weeks. EXAM: RIGHT SHOULDER - 2+ VIEW COMPARISON:  None Available. FINDINGS: There is no evidence of fracture or dislocation. There is no evidence of arthropathy or other focal  bone abnormality. Soft tissues are unremarkable. IMPRESSION: Negative. Electronically Signed   By: Gerome Sam III M.D.   On: 02/01/2022 10:09    Procedures Procedures (including critical care time)  Medications Ordered in UC Medications - No data to display  Initial Impression / Assessment and Plan / UC Course  I have reviewed the triage vital signs and the nursing notes.  Pertinent labs & imaging results that were available during my care of the patient were reviewed by me and considered in my medical decision making (see chart for details).     Patient has a trigger point of tenderness.  Offered injection.  Patient prefers to try oral steroids.  Requests referral to orthopedics Final Clinical Impressions(s) / UC Diagnoses   Final diagnoses:  Acute pain of right shoulder     Discharge Instructions      Continue to use ice on shoulder Take the Medrol Dosepak as directed.  This is a steroid to reduce pain and inflammation Call next week if not improving to see an orthopedist     ED Prescriptions     Medication Sig Dispense Auth.  Provider   methylPREDNISolone (MEDROL DOSEPAK) 4 MG TBPK tablet tad 21 tablet Eustace Moore, MD      PDMP not reviewed this encounter.   Eustace Moore, MD 02/01/22 [phone]

## 2022-02-01 NOTE — ED Triage Notes (Signed)
Pt c/o intermittent RT shoulder pain x 3 weeks. Says she took the pull start mower out to mulch some leaves and her shoulder has hurt ever since. Worsening in last week. Taking tylenol and meloxicam prn. Also icing prn.

## 2022-02-01 NOTE — Discharge Instructions (Signed)
Continue to use ice on shoulder Take the Medrol Dosepak as directed.  This is a steroid to reduce pain and inflammation Call next week if not improving to see an orthopedist

## 2022-02-19 ENCOUNTER — Ambulatory Visit (INDEPENDENT_AMBULATORY_CARE_PROVIDER_SITE_OTHER): Payer: Medicare Other | Admitting: Physician Assistant

## 2022-02-19 ENCOUNTER — Encounter: Payer: Self-pay | Admitting: Physician Assistant

## 2022-02-19 DIAGNOSIS — M7541 Impingement syndrome of right shoulder: Secondary | ICD-10-CM

## 2022-02-19 NOTE — Progress Notes (Signed)
Office Visit Note   Patient: Glenda Lyons           Date of Birth: [birth year]           MRN: [id number] Visit Date: 02/19/2022              Requested by: [address], [place], [place] [address] [place],  [place] [address] PCP: Carollee Herter, Alferd Apa, DO  Chief Complaint  Patient presents with   Right Shoulder - Pain      HPI: [name] comes in today with a chief complaint of right shoulder pain.  She says this first became acute when she was pulling on a lawnmower changes start it.  At that time her pain was significant and she had difficulty using her right arm.  She was placed on a prednisone Dosepak which did help her.  She said recently she reached across her body in the kitchen over the sink and had return of her painful symptoms.  She denies any neck pain or paresthesias.  Assessment & Plan: Visit Diagnoses: Impingement right shoulder  Plan: Findings consistent with right shoulder impingement.  She does have a little degenerative changes of her AC joint.  Discussed this in detail with her.  Would recommend a steroid shot.  Also gave her some shoulder exercises to do and she like to try this first.  She will call me in 10 days if she gets no relief we could consider an MRI  Follow-Up Instructions: Return if symptoms worsen or fail to improve.   Ortho Exam  Patient is alert, oriented, no adenopathy, well-dressed, normal affect, normal respiratory effort. Examination of her right shoulder she has full forward elevation.  She has internal rotation behind her back but this is a little bit painful to her.  She is neurovascularly intact distally she has 5 out of 5 strength with resisted abduction and external rotation.  Internal rotation she has positive empty can test and positive cross body impingement findings.  She is tender over the [facility] joint.  Imaging: No results found. No images are attached to the encounter.  Labs: Lab Results  Component Value Date   HGBA1C  6.1 (H) 12/15/2005   ESRSEDRATE 6 11/20/2020   CRP 2.1 01/14/2021   LABORGA KLEBSIELLA PNEUMONIAE 06/01/2015     Lab Results  Component Value Date   ALBUMIN 4.4 12/05/2021   ALBUMIN 4.5 11/20/2020   ALBUMIN 4.4 04/13/2020    No results found for: "MG" Lab Results  Component Value Date   VD25OH 42.15 11/20/2020    No results found for: "PREALBUMIN"    Latest Ref Rng & Units 12/05/2021    3:21 PM 11/20/2020    3:40 PM 01/25/2019    2:16 PM  CBC EXTENDED  WBC 4.0 - 10.5 K/uL 6.7  6.5  8.5   RBC 3.87 - 5.11 Mil/uL 4.31  4.37  4.93   Hemoglobin 12.0 - 15.0 g/dL 13.7  13.7  15.4   HCT 36.0 - 46.0 % 40.9  41.6  46.5   Platelets 150.0 - 400.0 K/uL 212.0  203.0  250.0   NEUT# 1.4 - 7.7 K/uL 4.6  4.1  5.5   Lymph# 0.7 - 4.0 K/uL 1.5  1.8  2.1      There is no height or weight on file to calculate BMI.  Orders:  No orders of the defined types were placed in this encounter.  No orders of the defined types were  placed in this encounter.    Procedures: No procedures performed  Clinical Data: No additional findings.  ROS:  All other systems negative, except as noted in the HPI. Review of Systems  Objective: Vital Signs: There were no vitals taken for this visit.  Specialty Comments:  No specialty comments available.  PMFS History: Patient Active Problem List   Diagnosis Date Noted   Rheumatoid factor positive 01/14/2021   Bilateral hip pain 01/14/2021   Preventative health care 11/20/2020   Allergy    Depression    Hyperlipemia    Hypertension    Neuromuscular disorder ([place])    Osteoarthritis    Sciatica    Depression with anxiety 04/13/2020   Chronic midline low back pain without sciatica 05/02/2019   Palpitation 01/25/2019   New onset left bundle branch block (LBBB) 01/25/2019   Acute bacterial sinusitis 12/26/2014   Bilateral conjunctivitis 12/26/2014   Status post lumbar spinal fusion 12/21/2014   Spinal stenosis of lumbar region with neurogenic  claudication 09/12/2014   Skin laxity 08/20/2013   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   Seborrheic keratoses 07/08/2012   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   Thyroid eye disease 08/21/2011   Esodeviation 12/19/2010   Dysuria 06/14/2010   COLONIC POLYPS, HX OF 03/13/2010   Hyperlipidemia 06/03/2007   OSTEOARTHRITIS 06/03/2007   UNSPECIFIED PERIPHERAL VASCULAR DISEASE 12/29/2006   POSTMENOPAUSAL STATUS 12/29/2006   Depression, major, single episode, mild ([place]) 07/10/2006   Essential hypertension 07/10/2006   HERNIATED [place] 07/10/2006   BURSITIS, RIGHT HIP 07/10/2006   Past Medical History:  Diagnosis Date   Acute bacterial sinusitis 12/26/2014   Allergy    seasonal   Bilateral conjunctivitis 12/26/2014   Bursitis    BURSITIS, RIGHT HIP 07/10/2006   Qualifier: Diagnosis of  By: Jerold Coombe     Chronic midline low back pain without sciatica 05/02/2019   COLONIC POLYPS, HX OF 03/13/2010   Qualifier: Diagnosis of  By: Jerold Coombe     Depression    Depression with anxiety 04/13/2020   Depression, major, single episode, mild ([place]) 07/10/2006   Qualifier: Diagnosis of  By: Jerold Coombe     Dysuria 06/14/2010   Esodeviation 12/19/2010   Essential hypertension 07/10/2006   Qualifier: Diagnosis of  By: Jerold Coombe     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   Herniated disc    HERNIATED DISC 07/10/2006   Qualifier: Diagnosis of  By: Jerold Coombe     Hyperlipemia    Hyperlipidemia 06/03/2007   Qualifier: Diagnosis of  By: Jerold Coombe     Hypertension    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   Neuromuscular disorder ([place])    New onset left bundle branch block (LBBB) 01/25/2019   Osteoarthritis    OSTEOARTHRITIS 06/03/2007   Qualifier: Diagnosis of  By: Jerold Coombe     Palpitation 01/25/2019   POSTMENOPAUSAL STATUS 12/29/2006   Qualifier: Diagnosis of  By: Jerold Coombe     Sciatica    Seborrheic keratoses 07/08/2012   Skin  laxity 08/20/2013   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   Status post lumbar spinal fusion 12/21/2014   Thyroid eye disease 08/21/2011   UNSPECIFIED PERIPHERAL VASCULAR DISEASE 12/29/2006   Qualifier: Diagnosis of  By: Jerold Coombe      Family History  Problem Relation Age of Onset   Breast cancer Mother    Heart attack Other  Lymphoma Other    Stroke Cousin    Hypertension Cousin    Stroke Cousin    Hypertension Cousin    Colon cancer Neg Hx     Past Surgical History:  Procedure Laterality Date   BIOPSY BREAST     BREAST BIOPSY Right    CYSTECTOMY     leg   DILATION AND CURETTAGE OF UTERUS  09/11/2010   Procedure: DILATATION AND CURETTAGE (D&C);  Surgeon: Michael Litter, MD;  Location: [facility];  Service: Gynecology;  Laterality: N/A;   ENDOMETRIAL BIOPSY     HYSTEROSCOPY WITH RESECTOSCOPE  09/11/2010   Procedure: HYSTEROSCOPY WITH RESECTOSCOPE;  Surgeon: Michael Litter, MD;  Location: [facility];  Service: Gynecology;  Laterality: N/A;   LEEP  1994   TONSILLECTOMY     Social History   Occupational History   Not on file  Tobacco Use   Smoking status: Never   Smokeless tobacco: Never  Vaping Use   Vaping Use: Never used  Substance and Sexual Activity   Alcohol use: Yes    Comment: occasionally   Drug use: No   Sexual activity: Yes    Partners: Male

## 2022-05-20 ENCOUNTER — Ambulatory Visit (INDEPENDENT_AMBULATORY_CARE_PROVIDER_SITE_OTHER): Payer: Medicare Other | Admitting: Physician Assistant

## 2022-05-20 ENCOUNTER — Encounter: Payer: Self-pay | Admitting: Physician Assistant

## 2022-05-20 DIAGNOSIS — M7541 Impingement syndrome of right shoulder: Secondary | ICD-10-CM | POA: Diagnosis not present

## 2022-05-20 NOTE — Progress Notes (Signed)
Office Visit Note   Patient: Glenda Lyons           Date of Birth: [birth year]           MRN: [id number] Visit Date: 05/20/2022              Requested by: [address], [place], [place] [address] [place],  [place] [address] PCP: Zola Button, Grayling Congress, DO  Chief Complaint  Patient presents with   Right Shoulder - Pain      HPI: Glenda Lyons is a pleasant 77 year old woman who presents today with recurrent right shoulder pain.  This originally happened 5 months ago when she was reaching in a lawnmower chain.  She saw me and findings were consistent with impingement and she was given an injection of steroid.  This helped her for a while but the pain has returned and is almost worse.  Assessment & Plan: Visit Diagnoses:  1. Impingement syndrome of right shoulder     Plan: Right shoulder pain and impingement.  She does have some stiffness with external rotation of her shoulder.  Her motion has decreased actively because of the pain though passively I can get full extension.  Because of her failure to respond to conservative treatment and the length of time I will go forward with an MRI.  I can contact her with the results for the appropriate follow-up.  I did review her x-rays taken by the emergency department she does have some cystic changes in the humeral head  Follow-Up Instructions: No follow-ups on file.   Ortho Exam  Patient is alert, oriented, no adenopathy, well-dressed, normal affect, normal respiratory effort. Right shoulder no redness no erythema she is neurovascularly intact.  Actively she can go to about 100 degrees can go passively to full extension although she is able to hold this.  Decreased internal rotation behind the back and external rotation secondary to pain.  She has good resistance strength with abduction external and internal rotation.  She has positive impingement findings  Imaging: No results found. No images are attached to the  encounter.  Labs: Lab Results  Component Value Date   HGBA1C 6.1 (H) 12/15/2005   ESRSEDRATE 6 11/20/2020   CRP 2.1 01/14/2021   LABORGA KLEBSIELLA PNEUMONIAE 06/01/2015     Lab Results  Component Value Date   ALBUMIN 4.4 12/05/2021   ALBUMIN 4.5 11/20/2020   ALBUMIN 4.4 04/13/2020    No results found for: "MG" Lab Results  Component Value Date   VD25OH 42.15 11/20/2020    No results found for: "PREALBUMIN"    Latest Ref Rng & Units 12/05/2021    3:21 PM 11/20/2020    3:40 PM 01/25/2019    2:16 PM  CBC EXTENDED  WBC 4.0 - 10.5 K/uL 6.7  6.5  8.5   RBC 3.87 - 5.11 Mil/uL 4.31  4.37  4.93   Hemoglobin 12.0 - 15.0 g/dL 92.1  19.4  17.4   HCT 36.0 - 46.0 % 40.9  41.6  46.5   Platelets 150.0 - 400.0 K/uL 212.0  203.0  250.0   NEUT# 1.4 - 7.7 K/uL 4.6  4.1  5.5   Lymph# 0.7 - 4.0 K/uL 1.5  1.8  2.1      There is no height or weight on file to calculate BMI.  Orders:  Orders Placed This Encounter  Procedures   MR SHOULDER RIGHT WO CONTRAST   No orders of the defined types were  placed in this encounter.    Procedures: No procedures performed  Clinical Data: No additional findings.  ROS:  All other systems negative, except as noted in the HPI. Review of Systems  Objective: Vital Signs: There were no vitals taken for this visit.  Specialty Comments:  No specialty comments available.  PMFS History: Patient Active Problem List   Diagnosis Date Noted   Impingement syndrome of right shoulder 02/19/2022   Rheumatoid factor positive 01/14/2021   Bilateral hip pain 01/14/2021   Preventative health care 11/20/2020   Allergy    Depression    Hyperlipemia    Hypertension    Neuromuscular disorder    Osteoarthritis    Sciatica    Depression with anxiety 04/13/2020   Chronic midline low back pain without sciatica 05/02/2019   Palpitation 01/25/2019   New onset left bundle branch block (LBBB) 01/25/2019   Acute bacterial sinusitis 12/26/2014    Bilateral conjunctivitis 12/26/2014   Status post lumbar spinal fusion 12/21/2014   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   Skin laxity 08/20/2013   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   Seborrheic keratoses 07/08/2012   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   Thyroid eye disease 08/21/2011   Esodeviation 12/19/2010   Dysuria 06/14/2010   COLONIC POLYPS, HX OF 03/13/2010   Hyperlipidemia 06/03/2007   OSTEOARTHRITIS 06/03/2007   UNSPECIFIED PERIPHERAL VASCULAR DISEASE 12/29/2006   POSTMENOPAUSAL STATUS 12/29/2006   Depression, major, single episode, mild ([facility]) 07/10/2006   Essential hypertension 07/10/2006   HERNIATED DISC 07/10/2006   BURSITIS, RIGHT HIP 07/10/2006   Past Medical History:  Diagnosis Date   Acute bacterial sinusitis 12/26/2014   Allergy    seasonal   Bilateral conjunctivitis 12/26/2014   Bursitis    BURSITIS, RIGHT HIP 07/10/2006   Qualifier: Diagnosis of  By: Janit BernLowne DO, Yvonne     Chronic midline low back pain without sciatica 05/02/2019   COLONIC POLYPS, HX OF 03/13/2010   Qualifier: Diagnosis of  By: Janit BernLowne DO, Yvonne     Depression    Depression with anxiety 04/13/2020   Depression, major, single episode, mild ([facility]) 07/10/2006   Qualifier: Diagnosis of  By: Janit BernLowne DO, Yvonne     Dysuria 06/14/2010   Esodeviation 12/19/2010   Essential hypertension 07/10/2006   Qualifier: Diagnosis of  By: Janit BernLowne DO, Yvonne     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   Herniated disc    HERNIATED DISC 07/10/2006   Qualifier: Diagnosis of  By: Janit BernLowne DO, Yvonne     Hyperlipemia    Hyperlipidemia 06/03/2007   Qualifier: Diagnosis of  By: Janit BernLowne DO, Yvonne     Hypertension    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   Neuromuscular disorder ([facility])    New onset left bundle branch block (LBBB) 01/25/2019   Osteoarthritis    OSTEOARTHRITIS 06/03/2007   Qualifier: Diagnosis of  By: Janit BernLowne DO, Yvonne     Palpitation 01/25/2019    POSTMENOPAUSAL STATUS 12/29/2006   Qualifier: Diagnosis of  By: Janit BernLowne DO, Yvonne     Sciatica    Seborrheic keratoses 07/08/2012   Skin laxity 08/20/2013   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   Status post lumbar spinal fusion 12/21/2014   Thyroid eye disease 08/21/2011   UNSPECIFIED PERIPHERAL VASCULAR DISEASE 12/29/2006   Qualifier: Diagnosis of  By: Janit BernLowne DO, Yvonne      Family History  Problem Relation Age of Onset   Breast cancer Mother  Heart attack Other    Lymphoma Other    Stroke Cousin    Hypertension Cousin    Stroke Cousin    Hypertension Cousin    Colon cancer Neg Hx     Past Surgical History:  Procedure Laterality Date   BIOPSY BREAST     BREAST BIOPSY Right    CYSTECTOMY     leg   DILATION AND CURETTAGE OF UTERUS  09/11/2010   Procedure: DILATATION AND CURETTAGE (D&C);  Surgeon: Michael Litter, MD;  Location: [facility];  Service: Gynecology;  Laterality: N/A;   ENDOMETRIAL BIOPSY     HYSTEROSCOPY WITH RESECTOSCOPE  09/11/2010   Procedure: HYSTEROSCOPY WITH RESECTOSCOPE;  Surgeon: Michael Litter, MD;  Location: [facility];  Service: Gynecology;  Laterality: N/A;   LEEP  1994   TONSILLECTOMY     Social History   Occupational History   Not on file  Tobacco Use   Smoking status: Never   Smokeless tobacco: Never  Vaping Use   Vaping Use: Never used  Substance and Sexual Activity   Alcohol use: Yes    Comment: occasionally   Drug use: No   Sexual activity: Yes    Partners: Male

## 2022-05-23 ENCOUNTER — Telehealth: Payer: Self-pay | Admitting: Physician Assistant

## 2022-05-23 NOTE — Telephone Encounter (Signed)
Called and advised and gave [place]'s #

## 2022-05-23 NOTE — Telephone Encounter (Signed)
Patient hasn't received a call about her MRI

## 2022-06-01 ENCOUNTER — Other Ambulatory Visit: Payer: Self-pay | Admitting: Family Medicine

## 2022-06-01 DIAGNOSIS — F320 Major depressive disorder, single episode, mild: Secondary | ICD-10-CM

## 2022-06-02 ENCOUNTER — Ambulatory Visit
Admission: RE | Admit: 2022-06-02 | Discharge: 2022-06-02 | Disposition: A | Discharge status: Home / Self Care | Payer: Medicare Other | Source: Ambulatory Visit | Attending: Physician Assistant | Admitting: Physician Assistant

## 2022-06-02 ENCOUNTER — Encounter: Payer: Self-pay | Admitting: *Deleted

## 2022-06-02 DIAGNOSIS — M7541 Impingement syndrome of right shoulder: Secondary | ICD-10-CM

## 2022-06-03 ENCOUNTER — Encounter: Payer: Medicare Other | Admitting: Family Medicine

## 2022-06-09 ENCOUNTER — Other Ambulatory Visit: Payer: Self-pay

## 2022-06-09 ENCOUNTER — Ambulatory Visit (INDEPENDENT_AMBULATORY_CARE_PROVIDER_SITE_OTHER): Payer: Medicare Other | Admitting: Orthopedic Surgery

## 2022-06-09 DIAGNOSIS — M19011 Primary osteoarthritis, right shoulder: Secondary | ICD-10-CM | POA: Diagnosis not present

## 2022-06-09 NOTE — Progress Notes (Signed)
Office Visit Note   Patient: Glenda Lyons           Date of Birth: October 05, 1945           MRN: [id number] Visit Date: 06/09/2022 Requested by: [address], [place], [place] [address] [place],  [place] [address] PCP: Zola Button, Grayling Congress, DO  Subjective: Chief Complaint  Patient presents with   Right Shoulder - Pain    HPI: Glenda Lyons is a 77 y.o. female who presents to the office reporting right shoulder pain.  She was starting a lawnmower in November 2023 and developed pain.  Worsened after a few weeks.  Reports pain posterior lateral and anterior radiating to the biceps.  MRI scan shows retracted rotator cuff tear of the supraspinatus and infraspinatus.  Steroid Dosepak from urgent care helped some.  Subacromial injection gave him pretty good relief in January 1970 percent.  Her husband has some issues which will require treatment in terms of infected pacemaker.  She likes to swim and play pickle ball..                ROS: All systems reviewed are negative as they relate to the chief complaint within the history of present illness.  Patient denies fevers or chills.  Assessment & Plan: Visit Diagnoses:  1. Arthritis of right glenohumeral joint     Plan: Impression is retracted rotator cuff tear with weakness in the arm in the face of significant arthritis in the glenohumeral joint.  Injection performed today into the glenohumeral joint.  Ultrasound guidance.  She is not a great place now where she could do surgical intervention.  Surgical intervention would consist most likely of reverse shoulder replacement based on arthritis and the rotator cuff tearing.  Plan to see her back in about 3 months for clinical recheck.  Follow-Up Instructions: No follow-ups on file.   Orders:  Orders Placed This Encounter  Procedures   US Guided Needle Placement - No Linked Charges   No orders of the defined types were placed in this encounter.     Procedures: Large Joint  Inj: R glenohumeral on 06/09/2022 11:22 AM Indications: diagnostic evaluation and pain Details: 18 G 1.5 in needle, ultrasound-guided posterior approach  Arthrogram: No  Medications: 9 mL bupivacaine 0.5 %; 40 mg methylPREDNISolone acetate 40 MG/ML; 5 mL lidocaine 1 % Outcome: tolerated well, no immediate complications Procedure, treatment alternatives, risks and benefits explained, specific risks discussed. Consent was given by the patient. Immediately prior to procedure a time out was called to verify the correct patient, procedure, equipment, support staff and site/side marked as required. Patient was prepped and draped in the usual sterile fashion.       Clinical Data: No additional findings.  Objective: Vital Signs: There were no vitals taken for this visit.  Physical Exam:  Constitutional: Patient appears well-developed HEENT:  Head: Normocephalic Eyes:EOM are normal Neck: Normal range of motion Cardiovascular: Normal rate Pulmonary/chest: Effort normal Neurologic: Patient is alert Skin: Skin is warm Psychiatric: Patient has normal mood and affect  Ortho Exam: Ortho exam demonstrates passive range of motion on the right at 45/95/160.  Does have weakness to external rotation on the right of about 3 out of 5 compared to the left 5 out of 5.  She is able to forward flex and AB duct above 90 degrees.  Some coarse grinding and crepitus is present with passive range of motion of that right shoulder.  Motor  or sensory function in the hands intact.  Specialty Comments:  No specialty comments available.  Imaging: US Guided Needle Placement - No Linked Charges  Result Date: 06/09/2022 Ultrasound imaging demonstrates needle placement into the glenohumeral joint with extravasation of fluid and no complicating features    PMFS History: Patient Active Problem List   Diagnosis Date Noted   Impingement syndrome of right shoulder 02/19/2022   Rheumatoid factor positive 01/14/2021    Bilateral hip pain 01/14/2021   Preventative health care 11/20/2020   Allergy    Depression    Hyperlipemia    Hypertension    Neuromuscular disorder ([facility])    Osteoarthritis    Sciatica    Depression with anxiety 04/13/2020   Chronic midline low back pain without sciatica 05/02/2019   Palpitation 01/25/2019   New onset left bundle branch block (LBBB) 01/25/2019   Acute bacterial sinusitis 12/26/2014   Bilateral conjunctivitis 12/26/2014   Status post lumbar spinal fusion 12/21/2014   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   Skin laxity 08/20/2013   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   Seborrheic keratoses 07/08/2012   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   Thyroid eye disease 08/21/2011   Esodeviation 12/19/2010   Dysuria 06/14/2010   COLONIC POLYPS, HX OF 03/13/2010   Hyperlipidemia 06/03/2007   OSTEOARTHRITIS 06/03/2007   UNSPECIFIED PERIPHERAL VASCULAR DISEASE 12/29/2006   POSTMENOPAUSAL STATUS 12/29/2006   Depression, major, single episode, mild ([facility]) 07/10/2006   Essential hypertension 07/10/2006   HERNIATED DISC 07/10/2006   BURSITIS, RIGHT HIP 07/10/2006   Past Medical History:  Diagnosis Date   Acute bacterial sinusitis 12/26/2014   Allergy    seasonal   Bilateral conjunctivitis 12/26/2014   Bursitis    BURSITIS, RIGHT HIP 07/10/2006   Qualifier: Diagnosis of  By: Janit Bern     Chronic midline low back pain without sciatica 05/02/2019   COLONIC POLYPS, HX OF 03/13/2010   Qualifier: Diagnosis of  By: Janit Bern     Depression    Depression with anxiety 04/13/2020   Depression, major, single episode, mild ([facility]) 07/10/2006   Qualifier: Diagnosis of  By: Janit Bern     Dysuria 06/14/2010   Esodeviation 12/19/2010   Essential hypertension 07/10/2006   Qualifier: Diagnosis of  By: Janit Bern     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   Herniated disc    HERNIATED DISC 07/10/2006   Qualifier:  Diagnosis of  By: Janit Bern     Hyperlipemia    Hyperlipidemia 06/03/2007   Qualifier: Diagnosis of  By: Janit Bern     Hypertension    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   Neuromuscular disorder ([facility])    New onset left bundle branch block (LBBB) 01/25/2019   Osteoarthritis    OSTEOARTHRITIS 06/03/2007   Qualifier: Diagnosis of  By: Janit Bern     Palpitation 01/25/2019   POSTMENOPAUSAL STATUS 12/29/2006   Qualifier: Diagnosis of  By: Janit Bern     Sciatica    Seborrheic keratoses 07/08/2012   Skin laxity 08/20/2013   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   Status post lumbar spinal fusion 12/21/2014   Thyroid eye disease 08/21/2011   UNSPECIFIED PERIPHERAL VASCULAR DISEASE 12/29/2006   Qualifier: Diagnosis of  By: Janit Bern      Family History  Problem Relation Age of Onset   Breast cancer Mother    Heart attack Other  Lymphoma Other    Stroke Cousin    Hypertension Cousin    Stroke Cousin    Hypertension Cousin    Colon cancer Neg Hx     Past Surgical History:  Procedure Laterality Date   BIOPSY BREAST     BREAST BIOPSY Right    CYSTECTOMY     leg   DILATION AND CURETTAGE OF UTERUS  09/11/2010   Procedure: DILATATION AND CURETTAGE (D&C);  Surgeon: Michael Litter, MD;  Location: [facility];  Service: Gynecology;  Laterality: N/A;   ENDOMETRIAL BIOPSY     HYSTEROSCOPY WITH RESECTOSCOPE  09/11/2010   Procedure: HYSTEROSCOPY WITH RESECTOSCOPE;  Surgeon: Michael Litter, MD;  Location: [facility];  Service: Gynecology;  Laterality: N/A;   LEEP  1994   TONSILLECTOMY     Social History   Occupational History   Not on file  Tobacco Use   Smoking status: Never   Smokeless tobacco: Never  Vaping Use   Vaping Use: Never used  Substance and Sexual Activity   Alcohol use: Yes    Comment: occasionally   Drug use: No   Sexual activity: Yes    Partners: Male

## 2022-06-14 ENCOUNTER — Encounter: Payer: Self-pay | Admitting: Orthopedic Surgery

## 2022-06-14 MED ORDER — METHYLPREDNISOLONE ACETATE 40 MG/ML IJ SUSP
40.0000 mg | INTRAMUSCULAR | Status: AC | PRN
  Administered 2022-06-09: 40 mg via INTRA_ARTICULAR

## 2022-06-14 MED ORDER — BUPIVACAINE HCL 0.5 % IJ SOLN
9.0000 mL | INTRAMUSCULAR | Status: AC | PRN
  Administered 2022-06-09: 9 mL via INTRA_ARTICULAR

## 2022-06-14 MED ORDER — LIDOCAINE HCL 1 % IJ SOLN
5.0000 mL | INTRAMUSCULAR | Status: AC | PRN
  Administered 2022-06-09: 5 mL

## 2022-06-17 ENCOUNTER — Encounter: Payer: Self-pay | Admitting: *Deleted

## 2022-09-10 ENCOUNTER — Ambulatory Visit: Payer: Medicare Other | Admitting: Orthopedic Surgery

## 2022-09-30 ENCOUNTER — Encounter: Payer: Self-pay | Admitting: Family Medicine

## 2022-09-30 ENCOUNTER — Ambulatory Visit: Payer: Medicare Other | Admitting: Family Medicine

## 2022-09-30 VITALS — BP 140/90 | HR 79 | Temp 98.3°F | Resp 18 | Ht 64.0 in | Wt 146.0 lb

## 2022-09-30 DIAGNOSIS — E785 Hyperlipidemia, unspecified: Secondary | ICD-10-CM | POA: Diagnosis not present

## 2022-09-30 DIAGNOSIS — I10 Essential (primary) hypertension: Secondary | ICD-10-CM | POA: Diagnosis not present

## 2022-09-30 DIAGNOSIS — R198 Other specified symptoms and signs involving the digestive system and abdomen: Secondary | ICD-10-CM | POA: Diagnosis not present

## 2022-09-30 DIAGNOSIS — F320 Major depressive disorder, single episode, mild: Secondary | ICD-10-CM

## 2022-09-30 LAB — COMPREHENSIVE METABOLIC PANEL
ALT: 14 U/L (ref 0–35)
AST: 18 U/L (ref 0–37)
Albumin: 4.7 g/dL (ref 3.5–5.2)
Alkaline Phosphatase: 65 U/L (ref 39–117)
BUN: 25 mg/dL — ABNORMAL HIGH (ref 6–23)
CO2: 27 mEq/L (ref 19–32)
Calcium: 10 mg/dL (ref 8.4–10.5)
Chloride: 104 mEq/L (ref 96–112)
Creatinine, Ser: 0.71 mg/dL (ref 0.40–1.20)
GFR: 81.86 mL/min (ref >60.00)
Glucose, Bld: 106 mg/dL — ABNORMAL HIGH (ref 70–99)
Potassium: 4.1 mEq/L (ref 3.5–5.1)
Sodium: 140 mEq/L (ref 135–145)
Total Bilirubin: 0.7 mg/dL (ref 0.2–1.2)
Total Protein: 7.3 g/dL (ref 6.0–8.3)

## 2022-09-30 LAB — CBC WITH DIFFERENTIAL/PLATELET
Basophils Absolute: 0 10*3/uL (ref 0.0–0.1)
Basophils Relative: 0.5 % (ref 0.0–3.0)
Eosinophils Absolute: 0.1 10*3/uL (ref 0.0–0.7)
Eosinophils Relative: 2 % (ref 0.0–5.0)
HCT: 42.6 % (ref 36.0–46.0)
Hemoglobin: 14.1 g/dL (ref 12.0–15.0)
Lymphocytes Relative: 26.2 % (ref 12.0–46.0)
Lymphs Abs: 1.9 10*3/uL (ref 0.7–4.0)
MCHC: 33.1 g/dL (ref 30.0–36.0)
MCV: 95.2 fl (ref 78.0–100.0)
Monocytes Absolute: 0.6 10*3/uL (ref 0.1–1.0)
Monocytes Relative: 7.5 % (ref 3.0–12.0)
Neutro Abs: 4.7 10*3/uL (ref 1.4–7.7)
Neutrophils Relative %: 63.8 % (ref 43.0–77.0)
Platelets: 236 10*3/uL (ref 150.0–400.0)
RBC: 4.48 Mil/uL (ref 3.87–5.11)
RDW: 12.7 % (ref 11.5–15.5)
WBC: 7.3 10*3/uL (ref 4.0–10.5)

## 2022-09-30 LAB — TSH: TSH: 4.13 u[IU]/mL (ref 0.35–5.50)

## 2022-09-30 LAB — LIPID PANEL
Cholesterol: 239 mg/dL — ABNORMAL HIGH (ref 0–200)
HDL: 77.3 mg/dL (ref >39.00)
LDL Cholesterol: 147 mg/dL — ABNORMAL HIGH (ref 0–99)
NonHDL: 161.39
Total CHOL/HDL Ratio: 3
Triglycerides: 71 mg/dL (ref 0.0–149.0)
VLDL: 14.2 mg/dL (ref 0.0–40.0)

## 2022-09-30 MED ORDER — SERTRALINE HCL 100 MG PO TABS
ORAL_TABLET | ORAL | 3 refills | Status: DC
Start: 2022-09-30 — End: 2023-11-02

## 2022-09-30 NOTE — Progress Notes (Signed)
Established Patient Office Visit  Subjective   Patient ID: Glenda Lyons, female    DOB: 11/07/1945  Age: 77 y.o. MRN: [id number]  Chief Complaint  Patient presents with   Hypertension   Hyperlipidemia   Follow-up    HPI Discussed the use of AI scribe software for clinical note transcription with the patient, who gave verbal consent to proceed.  History of Present Illness   The patient, with a history of constipation managed with dietary changes and occasional laxative use, presents with a change in bowel habits over the past two to two and a half weeks. She describes having soft, liquid stools several times a day, a change from her usual constipation. She also reports a feeling of needing to pass gas but only air comes out. The patient denies any pain associated with these changes. Her last colonoscopy was performed 10 years ago. She also mentions a previous shoulder injury and arthritis.      Patient Active Problem List   Diagnosis Date Noted   Impingement syndrome of right shoulder 02/19/2022   Rheumatoid factor positive 01/14/2021   Bilateral hip pain 01/14/2021   Preventative health care 11/20/2020   Allergy    Depression    Hyperlipemia    Hypertension    Neuromuscular disorder (HCC)    Osteoarthritis    Sciatica    Depression with anxiety 04/13/2020   Chronic midline low back pain without sciatica 05/02/2019   Palpitation 01/25/2019   New onset left bundle branch block (LBBB) 01/25/2019   Acute bacterial sinusitis 12/26/2014   Bilateral conjunctivitis 12/26/2014   Status post lumbar spinal fusion 12/21/2014   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   Skin laxity 08/20/2013   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   Seborrheic keratoses 07/08/2012   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   Thyroid eye disease 08/21/2011   Esodeviation 12/19/2010   Dysuria 06/14/2010   COLONIC POLYPS, HX OF 03/13/2010   Hyperlipidemia  06/03/2007   OSTEOARTHRITIS 06/03/2007   UNSPECIFIED PERIPHERAL VASCULAR DISEASE 12/29/2006   POSTMENOPAUSAL STATUS 12/29/2006   Depression, major, single episode, mild (HCC) 07/10/2006   Essential hypertension 07/10/2006   HERNIATED DISC 07/10/2006   BURSITIS, RIGHT HIP 07/10/2006   Past Medical History:  Diagnosis Date   Acute bacterial sinusitis 12/26/2014   Allergy    seasonal   Bilateral conjunctivitis 12/26/2014   Bursitis    BURSITIS, RIGHT HIP 07/10/2006   Qualifier: Diagnosis of  By: Janit Bern     Chronic midline low back pain without sciatica 05/02/2019   COLONIC POLYPS, HX OF 03/13/2010   Qualifier: Diagnosis of  By: Janit Bern     Depression    Depression with anxiety 04/13/2020   Depression, major, single episode, mild (HCC) 07/10/2006   Qualifier: Diagnosis of  By: Janit Bern     Dysuria 06/14/2010   Esodeviation 12/19/2010   Essential hypertension 07/10/2006   Qualifier: Diagnosis of  By: Janit Bern     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   Herniated disc    HERNIATED DISC 07/10/2006   Qualifier: Diagnosis of  By: Janit Bern     Hyperlipemia    Hyperlipidemia 06/03/2007   Qualifier: Diagnosis of  By: Janit Bern     Hypertension    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   Neuromuscular disorder (HCC)    New onset left bundle branch block (LBBB) 01/25/2019   Osteoarthritis  OSTEOARTHRITIS 06/03/2007   Qualifier: Diagnosis of  By: Janit Bern     Palpitation 01/25/2019   POSTMENOPAUSAL STATUS 12/29/2006   Qualifier: Diagnosis of  By: Janit Bern     Sciatica    Seborrheic keratoses 07/08/2012   Skin laxity 08/20/2013   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   Status post lumbar spinal fusion 12/21/2014   Thyroid eye disease 08/21/2011   UNSPECIFIED PERIPHERAL VASCULAR DISEASE 12/29/2006   Qualifier: Diagnosis of  By: Janit Bern     Past Surgical History:   Procedure Laterality Date   BIOPSY BREAST     BREAST BIOPSY Right    CYSTECTOMY     leg   DILATION AND CURETTAGE OF UTERUS  09/11/2010   Procedure: DILATATION AND CURETTAGE (D&C);  Surgeon: Michael Litter, MD;  Location: [facility];  Service: Gynecology;  Laterality: N/A;   ENDOMETRIAL BIOPSY     HYSTEROSCOPY WITH RESECTOSCOPE  09/11/2010   Procedure: HYSTEROSCOPY WITH RESECTOSCOPE;  Surgeon: Michael Litter, MD;  Location: [facility];  Service: Gynecology;  Laterality: N/A;   LEEP  1994   TONSILLECTOMY     Social History   Tobacco Use   Smoking status: Never   Smokeless tobacco: Never  Vaping Use   Vaping status: Never Used  Substance Use Topics   Alcohol use: Yes    Comment: occasionally   Drug use: No   Social History   Socioeconomic History   Marital status: Married    Spouse name: Not on file   Number of children: Not on file   Years of education: Not on file   Highest education level: Not on file  Occupational History   Not on file  Tobacco Use   Smoking status: Never   Smokeless tobacco: Never  Vaping Use   Vaping status: Never Used  Substance and Sexual Activity   Alcohol use: Yes    Comment: occasionally   Drug use: No   Sexual activity: Yes    Partners: Male  Other Topics Concern   Not on file  Social History Narrative   Exercise-- just walking up and down the stairs at home   Social Determinants of Health   Financial Resource Strain: Not on file  Food Insecurity: Not on file  Transportation Needs: Not on file  Physical Activity: Not on file  Stress: Not on file  Social Connections: Not on file  Intimate Partner Violence: Not on file   Family Status  Relation Name Status   Mother  Deceased at age 29       breast cancer   Father  Deceased at age 34       3 MIs ---  first one 48   Other  (Not Specified)   Cousin  Alive   Cousin  Alive   Neg Hx  (Not Specified)  No partnership data on file   Family History  Problem Relation Age of Onset    Breast cancer Mother    Heart attack Other    Lymphoma Other    Stroke Cousin    Hypertension Cousin    Stroke Cousin    Hypertension Cousin    Colon cancer Neg Hx    Allergies  Allergen Reactions   Almond Oil Other (See Comments)    Throat raw and scratchy   Other Other (See Comments)    SUTURE MATERIAL- REDNESS, IRRITATION, TOOK A LONG TIME TO HEAL      Review  of Systems  Constitutional:  Negative for chills, fever and malaise/fatigue.  HENT:  Negative for congestion and hearing loss.   Eyes:  Negative for discharge.  Respiratory:  Negative for cough, sputum production and shortness of breath.   Cardiovascular:  Negative for chest pain, palpitations and leg swelling.  Gastrointestinal:  Negative for abdominal pain, blood in stool, constipation, diarrhea, heartburn, nausea and vomiting.  Genitourinary:  Negative for dysuria, frequency, hematuria and urgency.  Musculoskeletal:  Negative for back pain, falls and myalgias.  Skin:  Negative for rash.  Neurological:  Negative for dizziness, sensory change, loss of consciousness, weakness and headaches.  Endo/Heme/Allergies:  Negative for environmental allergies. Does not bruise/bleed easily.  Psychiatric/Behavioral:  Negative for depression and suicidal ideas. The patient is not nervous/anxious and does not have insomnia.       Objective:     BP (!) 140/90 (BP Location: Left Arm, Patient Position: Sitting, Cuff Size: Normal)   Pulse 79   Temp 98.3 F (36.8 C) (Oral)   Resp 18   Ht 5\' 4"  (1.626 m)   Wt 146 lb (66.2 kg)   SpO2 98%   BMI 25.06 kg/m  BP Readings from Last 3 Encounters:  09/30/22 (!) 140/90  02/01/22 (!) 155/81  01/01/22 128/86   Wt Readings from Last 3 Encounters:  09/30/22 146 lb (66.2 kg)  01/01/22 144 lb 3.2 oz (65.4 kg)  12/05/21 143 lb 3.2 oz (65 kg)   SpO2 Readings from Last 3 Encounters:  09/30/22 98%  02/01/22 99%  01/01/22 98%      Physical Exam Vitals and nursing note reviewed.   Constitutional:      General: She is not in acute distress.    Appearance: Normal appearance. She is well-developed.  HENT:     Head: Normocephalic and atraumatic.     Right Ear: Tympanic membrane, ear canal and external ear normal. There is no impacted cerumen.     Left Ear: Tympanic membrane, ear canal and external ear normal. There is no impacted cerumen.     Nose: Nose normal.     Mouth/Throat:     Mouth: Mucous membranes are moist.     Pharynx: Oropharynx is clear. No oropharyngeal exudate or posterior oropharyngeal erythema.  Eyes:     General: No scleral icterus.       Right eye: No discharge.        Left eye: No discharge.     Conjunctiva/sclera: Conjunctivae normal.     Pupils: Pupils are equal, round, and reactive to light.  Neck:     Thyroid: No thyromegaly or thyroid tenderness.     Vascular: No JVD.  Cardiovascular:     Rate and Rhythm: Normal rate and regular rhythm.     Heart sounds: Normal heart sounds. No murmur heard. Pulmonary:     Effort: Pulmonary effort is normal. No respiratory distress.     Breath sounds: Normal breath sounds.  Abdominal:     General: Bowel sounds are normal. There is no distension.     Palpations: Abdomen is soft. There is no mass.     Tenderness: There is no abdominal tenderness. There is no guarding or rebound.  Genitourinary:    Vagina: Normal.  Musculoskeletal:        General: Normal range of motion.     Cervical back: Normal range of motion and neck supple.     Right lower leg: No edema.     Left lower leg: No edema.  Lymphadenopathy:  Cervical: No cervical adenopathy.  Skin:    General: Skin is warm and dry.     Findings: No erythema or rash.  Neurological:     Mental Status: She is alert and oriented to person, place, and time.     Cranial Nerves: No cranial nerve deficit.     Deep Tendon Reflexes: Reflexes are normal and symmetric.  Psychiatric:        Mood and Affect: Mood normal.        Behavior: Behavior normal.         Thought Content: Thought content normal.        Judgment: Judgment normal.      No results found for any visits on 09/30/22.  Last CBC Lab Results  Component Value Date   WBC 6.7 12/05/2021   HGB 13.7 12/05/2021   HCT 40.9 12/05/2021   MCV 94.8 12/05/2021   MCH 30.9 06/01/2015   RDW 13.1 12/05/2021   PLT 212.0 12/05/2021   Last metabolic panel Lab Results  Component Value Date   GLUCOSE 90 12/05/2021   NA 140 12/05/2021   K 3.9 12/05/2021   CL 103 12/05/2021   CO2 27 12/05/2021   BUN 26 (H) 12/05/2021   CREATININE 0.68 12/05/2021   GFR 84.33 12/05/2021   CALCIUM 9.5 12/05/2021   PROT 6.9 12/05/2021   ALBUMIN 4.4 12/05/2021   BILITOT 0.7 12/05/2021   ALKPHOS 56 12/05/2021   AST 20 12/05/2021   ALT 19 12/05/2021   Last lipids Lab Results  Component Value Date   CHOL 203 (H) 12/05/2021   HDL 78.10 12/05/2021   LDLCALC 113 (H) 12/05/2021   TRIG 58.0 12/05/2021   CHOLHDL 3 12/05/2021   Last hemoglobin A1c Lab Results  Component Value Date   HGBA1C 6.1 (H) 12/15/2005   Last thyroid functions Lab Results  Component Value Date   TSH 3.50 01/25/2019   Last vitamin D Lab Results  Component Value Date   VD25OH 42.15 11/20/2020   Last vitamin B12 and Folate No results found for: "VITAMINB12", "FOLATE"    The 10-year ASCVD risk score (Arnett DK, et al., 2019) is: 30.8%    Assessment & Plan:   Problem List Items Addressed This Visit       Unprioritized   Depression, major, single episode, mild (HCC)   Relevant Medications   sertraline (ZOLOFT) 100 MG tablet   Hyperlipidemia   Relevant Orders   CBC with Differential/Platelet   Comprehensive metabolic panel   Lipid panel   TSH   Hypertension - Primary   Relevant Orders   CBC with Differential/Platelet   Comprehensive metabolic panel   Lipid panel   TSH   Other Visit Diagnoses     Change in bowel function       Relevant Orders   Fecal occult blood, imunochemical   Ambulatory  referral to Gastroenterology     Assessment and Plan    Change in Bowel Habits Recent change in bowel habits with increased frequency and altered consistency. No associated pain. Last colonoscopy was in 2015. -Order stool guaiac test to check for occult blood. -Refer to gastroenterology for consultation and possible colonoscopy.  Depression Stable on Zoloft. -Refill Zoloft prescription.  General Health Maintenance -Discussed the importance of continuing annual mammograms and bone density scans. -Recommended pneumococcal vaccine, to be administered after steroid injections for shoulder arthritis are completed. -Encouraged follow-up with orthopedic specialist for ongoing shoulder issues.        No follow-ups on file.  Donato Schultz, DO

## 2022-10-01 ENCOUNTER — Other Ambulatory Visit: Payer: Self-pay

## 2022-10-01 ENCOUNTER — Ambulatory Visit (INDEPENDENT_AMBULATORY_CARE_PROVIDER_SITE_OTHER): Payer: Medicare Other | Admitting: Orthopedic Surgery

## 2022-10-01 DIAGNOSIS — M19011 Primary osteoarthritis, right shoulder: Secondary | ICD-10-CM | POA: Diagnosis not present

## 2022-10-02 ENCOUNTER — Encounter: Payer: Self-pay | Admitting: Orthopedic Surgery

## 2022-10-02 NOTE — Progress Notes (Signed)
Office Visit Note   Patient: Glenda Lyons           Date of Birth: 04-Jan-1946           MRN: [id number] Visit Date: 10/01/2022 Requested by: [address], [place], [place] [address] [place],  [place] [address] PCP: Zola Button, Grayling Congress, DO  Subjective: Chief Complaint  Patient presents with   Right Shoulder - Follow-up    HPI: Glenda Lyons is a 77 y.o. female who presents to the office reporting right shoulder pain.  Patient had glenohumeral injection on 06/09/2022 with very good relief.  Has reported some increased pain in the last 2 to 3 weeks and feels like the injection may be slowly wearing off.  Pain does not wake her from sleep.  Mostly anterior pain with no numbness and tingling in the neck pain.  Does radiate into the elbow but not below the elbow.  Patient has known rotator cuff arthropathy..                ROS: All systems reviewed are negative as they relate to the chief complaint within the history of present illness.  Patient denies fevers or chills.  Assessment & Plan: Visit Diagnoses:  1. Arthritis of right glenohumeral joint     Plan: Impression is right shoulder rotator cuff arthropathy and arthritis.  Ultrasound injection performed today.  She may consider surgical intervention at sometime in the future but for now she remains but for most activities of daily living.  Follow-up as needed.  Follow-Up Instructions: No follow-ups on file.   Orders:  Orders Placed This Encounter  Procedures   US Guided Needle Placement - No Linked Charges   No orders of the defined types were placed in this encounter.     Procedures: Large Joint Inj: R glenohumeral on 10/01/2022 7:25 AM Indications: diagnostic evaluation and pain Details: 22 G 1.5 in needle, ultrasound-guided posterior approach  Arthrogram: No  Medications: 9 mL bupivacaine 0.5 %; 40 mg methylPREDNISolone acetate 40 MG/ML; 5 mL lidocaine 1 % Outcome: tolerated well, no immediate  complications Procedure, treatment alternatives, risks and benefits explained, specific risks discussed. Consent was given by the patient. Immediately prior to procedure a time out was called to verify the correct patient, procedure, equipment, support staff and site/side marked as required. Patient was prepped and draped in the usual sterile fashion.       Clinical Data: No additional findings.  Objective: Vital Signs: There were no vitals taken for this visit.  Physical Exam:  Constitutional: Patient appears well-developed HEENT:  Head: Normocephalic Eyes:EOM are normal Neck: Normal range of motion Cardiovascular: Normal rate Pulmonary/chest: Effort normal Neurologic: Patient is alert Skin: Skin is warm Psychiatric: Patient has normal mood and affect  Ortho Exam: Ortho exam demonstrates bilateral range of motion 35/100/180.  She does have full active forward flexion and abduction but weakness with external rotation on the right-hand side.  Crepitus is present on the right-hand side.  Cervical spine range of motion intact.  Specialty Comments:  No specialty comments available.  Imaging: US Guided Needle Placement - No Linked Charges  Result Date: 10/02/2022 Ultrasound imaging demonstrates needle placement into the right glenohumeral joint with extravasation of fluid and no complicating features    PMFS History: Patient Active Problem List   Diagnosis Date Noted   Impingement syndrome of right shoulder 02/19/2022   Rheumatoid factor positive 01/14/2021   Bilateral hip pain 01/14/2021  Preventative health care 11/20/2020   Allergy    Depression    Hyperlipemia    Hypertension    Neuromuscular disorder (HCC)    Osteoarthritis    Sciatica    Depression with anxiety 04/13/2020   Chronic midline low back pain without sciatica 05/02/2019   Palpitation 01/25/2019   New onset left bundle branch block (LBBB) 01/25/2019   Acute bacterial sinusitis 12/26/2014   Bilateral  conjunctivitis 12/26/2014   Status post lumbar spinal fusion 12/21/2014   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   Skin laxity 08/20/2013   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   Seborrheic keratoses 07/08/2012   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   Thyroid eye disease 08/21/2011   Esodeviation 12/19/2010   Dysuria 06/14/2010   COLONIC POLYPS, HX OF 03/13/2010   Hyperlipidemia 06/03/2007   OSTEOARTHRITIS 06/03/2007   UNSPECIFIED PERIPHERAL VASCULAR DISEASE 12/29/2006   POSTMENOPAUSAL STATUS 12/29/2006   Depression, major, single episode, mild (HCC) 07/10/2006   Essential hypertension 07/10/2006   HERNIATED DISC 07/10/2006   BURSITIS, RIGHT HIP 07/10/2006   Past Medical History:  Diagnosis Date   Acute bacterial sinusitis 12/26/2014   Allergy    seasonal   Bilateral conjunctivitis 12/26/2014   Bursitis    BURSITIS, RIGHT HIP 07/10/2006   Qualifier: Diagnosis of  By: Janit Bern     Chronic midline low back pain without sciatica 05/02/2019   COLONIC POLYPS, HX OF 03/13/2010   Qualifier: Diagnosis of  By: Janit Bern     Depression    Depression with anxiety 04/13/2020   Depression, major, single episode, mild (HCC) 07/10/2006   Qualifier: Diagnosis of  By: Janit Bern     Dysuria 06/14/2010   Esodeviation 12/19/2010   Essential hypertension 07/10/2006   Qualifier: Diagnosis of  By: Janit Bern     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   Herniated disc    HERNIATED DISC 07/10/2006   Qualifier: Diagnosis of  By: Janit Bern     Hyperlipemia    Hyperlipidemia 06/03/2007   Qualifier: Diagnosis of  By: Janit Bern     Hypertension    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   Neuromuscular disorder (HCC)    New onset left bundle branch block (LBBB) 01/25/2019   Osteoarthritis    OSTEOARTHRITIS 06/03/2007   Qualifier: Diagnosis of  By: Janit Bern     Palpitation 01/25/2019   POSTMENOPAUSAL  STATUS 12/29/2006   Qualifier: Diagnosis of  By: Janit Bern     Sciatica    Seborrheic keratoses 07/08/2012   Skin laxity 08/20/2013   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   Status post lumbar spinal fusion 12/21/2014   Thyroid eye disease 08/21/2011   UNSPECIFIED PERIPHERAL VASCULAR DISEASE 12/29/2006   Qualifier: Diagnosis of  By: Janit Bern      Family History  Problem Relation Age of Onset   Breast cancer Mother    Heart attack Other    Lymphoma Other    Stroke Cousin    Hypertension Cousin    Stroke Cousin    Hypertension Cousin    Colon cancer Neg Hx     Past Surgical History:  Procedure Laterality Date   BIOPSY BREAST     BREAST BIOPSY Right    CYSTECTOMY     leg   DILATION AND CURETTAGE OF UTERUS  09/11/2010   Procedure: DILATATION AND CURETTAGE (D&C);  Surgeon: Samule Ohm  Tillman Abide, MD;  Location: [facility];  Service: Gynecology;  Laterality: N/A;   ENDOMETRIAL BIOPSY     HYSTEROSCOPY WITH RESECTOSCOPE  09/11/2010   Procedure: HYSTEROSCOPY WITH RESECTOSCOPE;  Surgeon: Michael Litter, MD;  Location: [facility];  Service: Gynecology;  Laterality: N/A;   LEEP  1994   TONSILLECTOMY     Social History   Occupational History   Not on file  Tobacco Use   Smoking status: Never   Smokeless tobacco: Never  Vaping Use   Vaping status: Never Used  Substance and Sexual Activity   Alcohol use: Yes    Comment: occasionally   Drug use: No   Sexual activity: Yes    Partners: Male

## 2022-10-05 MED ORDER — LIDOCAINE HCL 1 % IJ SOLN
5.0000 mL | INTRAMUSCULAR | Status: AC | PRN
Start: 2022-10-01 — End: 2022-10-01
  Administered 2022-10-01: 5 mL

## 2022-10-05 MED ORDER — BUPIVACAINE HCL 0.5 % IJ SOLN
9.0000 mL | INTRAMUSCULAR | Status: AC | PRN
Start: 2022-10-01 — End: 2022-10-01
  Administered 2022-10-01: 9 mL via INTRA_ARTICULAR

## 2022-10-05 MED ORDER — METHYLPREDNISOLONE ACETATE 40 MG/ML IJ SUSP
40.0000 mg | INTRAMUSCULAR | Status: AC | PRN
Start: 2022-10-01 — End: 2022-10-01
  Administered 2022-10-01: 40 mg via INTRA_ARTICULAR

## 2022-10-07 ENCOUNTER — Telehealth: Payer: Self-pay | Admitting: Family Medicine

## 2022-10-07 NOTE — Telephone Encounter (Signed)
Patient called to advise that her LDL has jumped up a bit and she is taking generic zocor but she has a hard time remembering to take it at bedtime. Pt wants to know if Dr. Laury Axon would consider changing her to another statin that she can take at any time of the day. Pt said she takes most of her meds in the morning. Pt uses [facility]. Please call pt to advise

## 2022-10-08 NOTE — Telephone Encounter (Signed)
LMOM informing Pt per PCP okay to change timing of simvastatin to morning if she will remember to take it better.

## 2022-12-03 ENCOUNTER — Ambulatory Visit: Payer: Medicare Other | Admitting: Cardiology

## 2022-12-29 ENCOUNTER — Telehealth: Payer: Self-pay

## 2022-12-29 NOTE — Telephone Encounter (Signed)
LVM to schedule AWV.

## 2023-01-29 ENCOUNTER — Other Ambulatory Visit: Payer: Self-pay | Admitting: Family Medicine

## 2023-01-29 DIAGNOSIS — E785 Hyperlipidemia, unspecified: Secondary | ICD-10-CM

## 2023-01-29 DIAGNOSIS — I10 Essential (primary) hypertension: Secondary | ICD-10-CM

## 2023-02-12 ENCOUNTER — Telehealth: Payer: Self-pay | Admitting: Cardiology

## 2023-02-12 NOTE — Telephone Encounter (Signed)
 Patient has her yearly follow up scheduled for 03/05/23. Patient would like to know if Dr. Servando Salina wants her to complete a stress test before her appointment. Please advise.

## 2023-02-12 NOTE — Telephone Encounter (Signed)
 Called and spoke to patient. Verified name and DOB. She stated she was not having symptoms at this time. Advised last stress test was normal and she currently do do not need one. Verbalized understanding and agree. Will call appointment for 1/23.

## 2023-03-04 NOTE — Progress Notes (Signed)
Cardiology Office Note    Date:  03/06/2023  ID:  Glenda Lyons, DOB 08-01-1945, MRN [id number] PCP:  Zola Button, Grayling Congress, DO  Cardiologist:  Thomasene Ripple, DO  Electrophysiologist:  None   Chief Complaint: Follow up for HTN   History of Present Illness: Glenda Lyons    Glenda Lyons is a 78 y.o. female with visit-pertinent history of hypertension, hyperlipidemia and left bundle branch block.  Patient first evaluated by Dr. Servando Salina on 02/14/2019 at the request of her PCP for an abnormal EKG.  She had recently had an EKG completed by her PCP that indicated a new left bundle branch block.  Blood pressure she was started on losartan 100 mg daily and hydrochlorothiazide 25 mg daily.  Her EKG with Dr. Servando Salina showed no evidence of left bundle branch block.  At her visit her blood pressure had been better controlled.  It was recommended that she undergo stress test that showed normal myocardial perfusion without evidence of ischemia or infarction, patient developed a rate related left bundle branch block that resolved, felt to be a low risk study.  She was last seen in clinic by Dr. Servando Salina on 01/01/2022.  She had remained stable from a cardiac standpoint.  Today she presents for follow up. She reports that she has been doing well.  She denies chest pain, shortness of breath, lower extremity edema, palpitations, orthopnea or PND.  She reports that she now has 2 small varicose veins in her right leg.  She also notes that over 6 months ago she was changing a light bulb, had presyncope and fell off of the stool, she denies frank syncope.  However she notes she did hit her head on the counter, she denied any medical evaluation at that time.  She was seen by her PCP in the weeks following.  She denies any further presyncope, denies syncope.  She reports that she is not regularly exercising but intends to start.  ROS: .   Today she denies chest pain, shortness of breath, lower extremity edema, fatigue, palpitations,  melena, hematuria, hemoptysis, diaphoresis, weakness, syncope, orthopnea, and PND.  All other systems are reviewed and otherwise negative. Studies Reviewed: Glenda Lyons    EKG:  EKG is ordered today, personally reviewed, demonstrating  EKG Interpretation Date/Time:  Thursday March 05 2023 15:40:09 EST Ventricular Rate:  67 PR Interval:  222 QRS Duration:  94 QT Interval:  388 QTC Calculation: 409 R Axis:   57  Text Interpretation: Sinus rhythm with 1st degree A-V block Anteroseptal infarct , age undetermined No significant change since last tracing Confirmed by Reather Littler [phone]) on 03/05/2023 3:47:38 PM   CV Studies:  Cardiac Studies & Procedures     STRESS TESTS  MYOCARDIAL PERFUSION IMAGING 02/24/2019  Narrative  The left ventricular ejection fraction is hyperdynamic (>65%).  Nuclear stress EF: 73%.  There was no ST segment deviation noted during stress.  No T wave inversion was noted during stress.  The study is normal.  This is a low risk study.  1. Normal myocardial perfusion imaging study without evidence of ischemia or infarction. 2. Patient developed a rate-related LBBB that resolved (developed ~90 bpm). 3. Normal LVEF, >65%. 4. Low-risk study.  Glenda Spore T. Flora Lipps, MD [facility] [address], [address] [place], [place] [address] [phone] 2:46 PM  ECHOCARDIOGRAM  ECHOCARDIOGRAM COMPLETE 02/01/2019  Narrative ECHOCARDIOGRAM REPORT    Patient Name:   Glenda Lyons Date of Exam: 02/01/2019 Medical Rec #:  [id number]       Height:       66.0 in Accession #:    [id number]      Weight:       155.4 lb Date of Birth:  1945-11-16      BSA:          1.80 m Patient Age:    73 years        BP:           160/100 mmHg Patient Gender: F               HR:           46 bpm. Exam Location:  [facility]  Procedure: 2D Echo, Cardiac Doppler and Color Doppler  Indications:    Palpitations  History:        Patient has no prior history of  Echocardiogram examinations. Arrythmias:LBBB; Risk Factors:Hypertension and Dyslipidemia.  Sonographer:    Sinda Du RDCS (AE) Referring Phys: [id number] YVONNE R LOWNE CHASE  IMPRESSIONS   1. Left ventricular ejection fraction, by visual estimation, is 60 to 65%. The left ventricle has normal function. There is no left ventricular hypertrophy. 2. The left ventricle has no regional wall motion abnormalities. 3. 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  FINDINGS Left Ventricle: Left ventricular ejection fraction, by visual estimation, is 60 to 65%. The left ventricle has normal function. The left ventricle has no regional wall motion abnormalities. There is no left ventricular hypertrophy. Normal left atrial pressure.  Right Ventricle: The right ventricular size is normal. No increase in right ventricular wall thickness. Global RV systolic function is has normal systolic function. The tricuspid regurgitant velocity is 2.13 m/s, and with an assumed right atrial pressure of 3 mmHg, the estimated right ventricular systolic pressure is normal at 21.1 mmHg.  Left Atrium: Left atrial size was normal in size.  Right Atrium: Right atrial size was normal in size  Pericardium: There is no evidence of pericardial effusion.  Mitral Valve: The mitral valve is normal in structure. No evidence of mitral valve regurgitation. No evidence of mitral valve stenosis by observation.  Tricuspid Valve: The tricuspid valve is normal in structure. Tricuspid valve regurgitation is mild.  Aortic Valve: The aortic valve is normal in structure. Aortic valve regurgitation is not visualized. The aortic valve is structurally normal, with no evidence of sclerosis or stenosis.  Pulmonic Valve: The pulmonic valve was normal in structure. Pulmonic valve regurgitation is not visualized. Pulmonic regurgitation is not visualized.  Aorta: The aortic root, ascending aorta and aortic arch are all  structurally normal, with no evidence of dilitation or obstruction. 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  Venous: The inferior vena cava is normal in size with greater than 50% respiratory variability, suggesting right atrial pressure of 3 mmHg.  IAS/Shunts: No atrial level shunt detected by color flow Doppler. There is no evidence of a patent foramen ovale. No ventricular septal defect is seen or detected. There is no evidence of an atrial septal defect.   LEFT VENTRICLE PLAX 2D LVIDd:         4.02 cm  Diastology LVIDs:         2.54 cm  LV e' lateral:   8.67 cm/s LV PW:         1.16 cm  LV E/e' lateral: 4.3 LV IVS:        1.30 cm  LV e' medial:  5.78 cm/s LVOT diam:     1.90 cm  LV E/e' medial:  6.5 LV SV:         48 ml LV SV Index:   26.13 LVOT Area:     2.84 cm   RIGHT VENTRICLE             IVC RV Basal diam:  3.04 cm     IVC diam: 1.25 cm RV S prime:     12.20 cm/s TAPSE (M-mode): 1.8 cm  LEFT ATRIUM             Index       RIGHT ATRIUM           Index LA diam:        3.50 cm 1.95 cm/m  RA Area:     13.00 cm LA Vol (A2C):   32.0 ml 17.81 ml/m RA Volume:   30.50 ml  16.98 ml/m LA Vol (A4C):   35.9 ml 19.98 ml/m LA Biplane Vol: 34.4 ml 19.15 ml/m AORTIC VALVE LVOT Vmax:   79.00 cm/s LVOT Vmean:  58.900 cm/s LVOT VTI:    0.165 m  AORTA Ao Root diam: 2.50 cm Ao Asc diam:  3.70 cm  MITRAL VALVE                        TRICUSPID VALVE MV Area (PHT): 2.20 cm             TR Peak grad:   18.1 mmHg MV PHT:        100.05 msec          TR Vmax:        213.00 cm/s MV Decel Time: 345 msec MV E velocity: 37.70 cm/s 103 cm/s  SHUNTS MV A velocity: 59.60 cm/s 70.3 cm/s Systemic VTI:  0.16 m MV E/A ratio:  0.63       1.5       Systemic Diam: 1.90 cm   Belva Crome MD Electronically signed by Belva Crome MD Signature Date/Time: 02/01/2019/3:15:28 PM    Final             Current Reported Medications:.    Current Meds   Medication Sig   aspirin 81 MG tablet Take 81 mg by mouth daily.   Calcium Carb-Cholecalciferol (CALCIUM-VITAMIN D) 500-400 MG-UNIT TABS Take by mouth 2 (two) times daily.   fluticasone (FLONASE) 50 MCG/ACT nasal spray instill 2 sprays into each nostril at bedtime   meloxicam (MOBIC) 15 MG tablet Take 0.5-1 tablets (7.5-15 mg total) by mouth daily.   sertraline (ZOLOFT) 100 MG tablet Take 1&1/2 tablets (150 mg total) by mouth daily.   VITAMIN A PO Take 3,000 mg by mouth daily.   vitamin C (ASCORBIC ACID) 250 MG tablet Take 250 mg by mouth daily.   [DISCONTINUED] losartan-hydrochlorothiazide (HYZAAR) 100-25 MG tablet Take 1 tablet by mouth daily.   [DISCONTINUED] simvastatin (ZOCOR) 40 MG tablet Take 1 tablet (40 mg total) by mouth at bedtime.    Physical Exam:    VS:  BP 132/82   Pulse 71   Ht 5' 4.5" (1.638 m)   Wt 148 lb 3.2 oz (67.2 kg)   SpO2 97%   BMI 25.05 kg/m    Wt Readings from Last 3 Encounters:  03/05/23 148 lb 3.2 oz (67.2 kg)  09/30/22 146 lb (66.2 kg)  01/01/22 144 lb 3.2 oz (65.4 kg)    GEN: Well nourished, well developed in no  acute distress NECK: No JVD; No carotid bruits CARDIAC: RRR, no murmurs, rubs, gallops RESPIRATORY:  Clear to auscultation without rales, wheezing or rhonchi  ABDOMEN: Soft, non-tender, non-distended EXTREMITIES:  No edema; No acute deformity   Asessement and Plan:Glenda Lyons    Hypertension: Blood pressure today 132/82.  Continue losartan hydrochlorothiazide.  Hyperlipidemia: Last lipid profile in 09/30/2022 indicated total cholesterol 239, HDL 77, triglycerides 71 and LDL 147.  Monitored and managed per PCP.  Heart healthy diet and regular cardiovascular exercise encouraged.  Discussed possible switch to rosuvastatin or atorvastatin, she reports she is to have repeat labs in February. Will discuss further with her PCP.   Presyncope: Patient notes that 6 months ago she was changing a light bulb while standing on a stool and became lightheaded and  dizzy, felt as though she was passing out.  She denied true syncope.  She notes she did hit her head on her counter, denied any further repeat episodes.  Discussed cardiac monitor however she notes she has had no palpitations, deferred, also discussed checking an echocardiogram, patient deferred.  She notes she will notify the office if she changes her mind.  Reviewed ED precautions.     Disposition: F/u with Dr. Servando Salina in one year or sooner if needed.   Signed, Rip Harbour, NP

## 2023-03-05 ENCOUNTER — Encounter: Payer: Self-pay | Admitting: Cardiology

## 2023-03-05 ENCOUNTER — Ambulatory Visit: Payer: Medicare Other | Attending: Cardiology | Admitting: Cardiology

## 2023-03-05 VITALS — BP 132/82 | HR 71 | Ht 64.5 in | Wt 148.2 lb

## 2023-03-05 DIAGNOSIS — I10 Essential (primary) hypertension: Secondary | ICD-10-CM | POA: Diagnosis not present

## 2023-03-05 DIAGNOSIS — E782 Mixed hyperlipidemia: Secondary | ICD-10-CM

## 2023-03-05 DIAGNOSIS — E785 Hyperlipidemia, unspecified: Secondary | ICD-10-CM

## 2023-03-05 MED ORDER — LOSARTAN POTASSIUM-HCTZ 100-25 MG PO TABS
1.0000 | ORAL_TABLET | Freq: Every day | ORAL | 0 refills | Status: DC
Start: 2023-03-05 — End: 2023-06-24

## 2023-03-05 MED ORDER — SIMVASTATIN 40 MG PO TABS
40.0000 mg | ORAL_TABLET | Freq: Every day | ORAL | 0 refills | Status: DC
Start: 2023-03-05 — End: 2023-06-24

## 2023-03-05 NOTE — Patient Instructions (Signed)
Medication Instructions:  No changes *If you need a refill on your cardiac medications before your next appointment, please call your pharmacy*  Lab Work: No labs  Testing/Procedures: No testing  Follow-Up: At [facility], you and your health needs are our priority.  As part of our continuing mission to provide you with exceptional heart care, we have created designated Provider Care Teams.  These Care Teams include your primary Cardiologist (physician) and Advanced Practice Providers (APPs -  Physician Assistants and Nurse Practitioners) who all work together to provide you with the care you need, when you need it.  We recommend signing up for the patient portal called "MyChart".  Sign up information is provided on this After Visit Summary.  MyChart is used to connect with patients for Virtual Visits (Telemedicine).  Patients are able to view lab/test results, encounter notes, upcoming appointments, etc.  Non-urgent messages can be sent to your provider as well.   To learn more about what you can do with MyChart, go to [email or web address].    Your next appointment:   1 year(s)  Provider:   Thomasene Ripple, DO   Other Instructions:

## 2023-03-06 ENCOUNTER — Encounter: Payer: Self-pay | Admitting: Cardiology

## 2023-03-11 ENCOUNTER — Ambulatory Visit: Payer: Medicare Other | Admitting: Physician Assistant

## 2023-03-18 ENCOUNTER — Ambulatory Visit: Payer: Medicare Other | Admitting: Physician Assistant

## 2023-03-23 ENCOUNTER — Other Ambulatory Visit: Payer: Self-pay | Admitting: Family Medicine

## 2023-03-23 DIAGNOSIS — M5440 Lumbago with sciatica, unspecified side: Secondary | ICD-10-CM

## 2023-06-24 ENCOUNTER — Other Ambulatory Visit: Payer: Self-pay | Admitting: Cardiology

## 2023-06-24 DIAGNOSIS — E782 Mixed hyperlipidemia: Secondary | ICD-10-CM

## 2023-06-24 DIAGNOSIS — I10 Essential (primary) hypertension: Secondary | ICD-10-CM

## 2023-06-29 ENCOUNTER — Telehealth: Payer: Self-pay | Admitting: Cardiology

## 2023-06-29 NOTE — Telephone Encounter (Signed)
 Pt called to report that she had a syncopal episode 2 weeks ago while grocery shopping that lasted a few seconds.. she started "yawning" and then she found herself on the ground.   She had the same episode this past Saturday... however she did not lose consciousness fully.. but she was on her house and started the yawning again prior to having presyncope.   The pt has been losing weight... she has been counting calories and cutting out carbs.   The only thing that she can remember eating the first time is coffee and cheese.   She thought she was just "dehydrated" and that is why she did not call right away.   She denies chest pain, dizziness, SOB... no CVA symptoms.   I made her a DOD appt for tomorrow.   I asked her to hold off driving until she sees an MD and able to work towards a diagnosis/ cause but she says that her husband is unable to drive and she is her own primary caregiver. She will try to find a ride and declined any help I offered her.

## 2023-06-29 NOTE — Telephone Encounter (Signed)
.  SYNCOPECHMG   Pt c/o Syncope: STAT if syncope occurred within 24 hours and pt complains of lightheadedness.   [place] if episode of passing out, completely, today or in last 24 hours   1. Did you pass out today? no   2. When is the last time you passed out? 04/27/23   3. Has this occurred multiple times? Passed out about 2 weeks before the 04/27/23  4. Did you have any symptoms prior to passing out? Yawning, tired, lightheaded  5. Did you fall? If so, are you on a blood thinner? Yes, aspirin

## 2023-06-30 ENCOUNTER — Ambulatory Visit: Attending: Cardiovascular Disease | Admitting: Cardiovascular Disease

## 2023-06-30 ENCOUNTER — Encounter: Payer: Self-pay | Admitting: Cardiovascular Disease

## 2023-06-30 ENCOUNTER — Ambulatory Visit: Attending: Cardiovascular Disease

## 2023-06-30 VITALS — BP 168/84 | HR 66 | Ht 64.5 in | Wt 141.0 lb

## 2023-06-30 DIAGNOSIS — R55 Syncope and collapse: Secondary | ICD-10-CM | POA: Diagnosis not present

## 2023-06-30 DIAGNOSIS — I10 Essential (primary) hypertension: Secondary | ICD-10-CM

## 2023-06-30 DIAGNOSIS — Z79899 Other long term (current) drug therapy: Secondary | ICD-10-CM | POA: Diagnosis not present

## 2023-06-30 NOTE — Assessment & Plan Note (Signed)
 Ms. Steinhauser is referred back today as an add-on for recent episodes of syncope.  She is a patient of Dr.Tobbs.  She has a history of hypertension, hyperlipidemia and left bundle branch block.  She had an episode of syncope 3 weeks ago while in a grocery store and again 3 days ago.  She wonders whether this is related to dehydration.  We talked about not driving for 6 months per [place]  law.  I am going to get routine lab work today including a basic metabolic panel and CBC.  Muscle going to get a 2D echo and a 2-week Zio patch and have her see Katlyn  West or Dr. Emmette Harms  back in follow-up.

## 2023-06-30 NOTE — Progress Notes (Signed)
 06/30/2023 Glenda Lyons   [birth year]  [id number]  Primary Physician Estill Hemming, DO Primary Cardiologist: Avanell Leigh MD Lathan Poke, [place], [place]  HPI:  Glenda Lyons is a 78 y.o. thin-appearing married Caucasian female with no children who is a patient of Dr. Ryan Coyer.  She is referred today for evaluation of syncope.  She has a history of hypertension, hyperlipidemia and left bundle branch block.  She had an episode of syncope 3 weeks ago while at a grocery store and again 3 days ago.  She thinks it may have been related to dehydration.  She denies chest pain or shortness of breath.   Current Meds  Medication Sig   aspirin 81 MG tablet Take 81 mg by mouth daily.   Calcium Carb-Cholecalciferol (CALCIUM-VITAMIN D) 500-400 MG-UNIT TABS Take by mouth 2 (two) times daily.   fluticasone  (FLONASE ) 50 MCG/ACT nasal spray instill 2 sprays into each nostril at bedtime   losartan -hydrochlorothiazide  (HYZAAR) 100-25 MG tablet Take 1 tablet by mouth daily.   meloxicam  (MOBIC ) 15 MG tablet Take 0.5-1 tablets (7.5-15 mg total) by mouth daily. (NEEDS APPOINTMENT BEFORE MORE REFILLS)   sertraline  (ZOLOFT ) 100 MG tablet Take 1&1/2 tablets (150 mg total) by mouth daily.   simvastatin  (ZOCOR ) 40 MG tablet Take 1 tablet (40 mg total) by mouth at bedtime.   VITAMIN A PO Take 3,000 mg by mouth daily.   vitamin C (ASCORBIC ACID) 250 MG tablet Take 250 mg by mouth daily.     Allergies  Allergen Reactions   Almond Oil Other (See Comments)    Throat raw and scratchy   Other Other (See Comments)    SUTURE MATERIAL- REDNESS, IRRITATION, TOOK A LONG TIME TO HEAL    Social History   Socioeconomic History   Marital status: Married    Spouse name: Not on file   Number of children: Not on file   Years of education: Not on file   Highest education level: Not on file  Occupational History   Not on file  Tobacco Use   Smoking status: Never   Smokeless tobacco: Never  Vaping Use    Vaping status: Never Used  Substance and Sexual Activity   Alcohol use: Yes    Comment: occasionally   Drug use: No   Sexual activity: Yes    Partners: Male  Other Topics Concern   Not on file  Social History Narrative   Exercise-- just walking up and down the stairs at home   Social Drivers of Health   Financial Resource Strain: Not on file  Food Insecurity: Not on file  Transportation Needs: Not on file  Physical Activity: Not on file  Stress: Not on file  Social Connections: Unknown (10/07/2022)   Received from [facility]   Social Network    Social Network: Not on file  Intimate Partner Violence: Unknown (10/07/2022)   Received from [facility]   HITS    Physically Hurt: Not on file    Insult or Talk Down To: Not on file    Threaten Physical Harm: Not on file    Scream or Curse: Not on file     Review of Systems: General: negative for chills, fever, night sweats or weight changes.  Cardiovascular: negative for chest pain, dyspnea on exertion, edema, orthopnea, palpitations, paroxysmal nocturnal dyspnea or shortness of breath Dermatological: negative for rash Respiratory: negative for cough or wheezing Urologic: negative for hematuria Abdominal: negative for nausea, vomiting, diarrhea,  bright red blood per rectum, melena, or hematemesis Neurologic: negative for visual changes, syncope, or dizziness All other systems reviewed and are otherwise negative except as noted above.    Blood pressure (!) 168/84, pulse 66, height 5' 4.5" (1.638 m), weight 141 lb (64 kg), SpO2 98%.  General appearance: alert and no distress Neck: no adenopathy, no carotid bruit, no JVD, supple, symmetrical, trachea midline, and thyroid  not enlarged, symmetric, no tenderness/mass/nodules Lungs: clear to auscultation bilaterally Heart: regular rate and rhythm, S1, S2 normal, no murmur, click, rub or gallop Extremities: extremities normal, atraumatic, no cyanosis or edema Pulses: 2+ and  symmetric Skin: Skin color, texture, turgor normal. No rashes or lesions Neurologic: Grossly normal  EKG EKG Interpretation Date/Time:  Tuesday Jun 30 2023 14:33:14 EDT Ventricular Rate:  66 PR Interval:  210 QRS Duration:  146 QT Interval:  434 QTC Calculation: 454 R Axis:   -23  Text Interpretation: Sinus rhythm with 1st degree A-V block Left bundle branch block When compared with ECG of 05-Mar-2023 15:40, Left bundle branch block is now Present Criteria for Anteroseptal infarct are no longer Present Confirmed by Lauro Portal [phone] on 06/30/2023 2:59:57 PM    ASSESSMENT AND PLAN:   Syncope and collapse Ms. Glenda Lyons is referred back today as an add-on for recent episodes of syncope.  She is a patient of Dr.Tobbs.  She has a history of hypertension, hyperlipidemia and left bundle branch block.  She had an episode of syncope 3 weeks ago while in a grocery store and again 3 days ago.  She wonders whether this is related to dehydration.  We talked about not driving for 6 months per [place]  law.  I am going to get routine lab work today including a basic metabolic panel and CBC.  Muscle going to get a 2D echo and a 2-week Zio patch and have her see Katlyn  West or Dr. Emmette Harms  back in follow-up.     Avanell Leigh MD FACP,FACC,FAHA, [facility] 06/30/2023 3:12 PM

## 2023-06-30 NOTE — Patient Instructions (Signed)
 Medication Instructions:  Your physician recommends that you continue on your current medications as directed. Please refer to the Current Medication list given to you today.    *If you need a refill on your cardiac medications before your next appointment, please call your pharmacy*   Lab Work: BASIC METABOLIC PANEL  COMPLETE BLOOD COUNT    If you have labs (blood work) drawn today and your tests are completely normal, you will receive your results only by: MyChart Message (if you have MyChart) OR A paper copy in the mail If you have any lab test that is abnormal or we need to change your treatment, we will call you to review the results.   Testing/Procedures: Your physician has requested that you have an echocardiogram. Echocardiography is a painless test that uses sound waves to create images of your heart. It provides your doctor with information about the size and shape of your heart and how well your heart's chambers and valves are working. This procedure takes approximately one hour. There are no restrictions for this procedure. Please do NOT wear cologne, perfume, aftershave, or lotions (deodorant is allowed). Please arrive 15 minutes prior to your appointment time.  Please note: We ask at that you not bring children with you during ultrasound (echo/ vascular) testing. Due to room size and safety concerns, children are not allowed in the ultrasound rooms during exams. Our front office staff cannot provide observation of children in our lobby area while testing is being conducted. An adult accompanying a patient to their appointment will only be allowed in the ultrasound room at the discretion of the ultrasound technician under special circumstances. We apologize for any inconvenience.  ZIO XT- Long Term Monitor Instructions  Your physician has requested you wear a ZIO patch monitor for 14 days.  This is a single patch monitor. [employer] supplies one patch monitor per enrollment.  Additional stickers are not available. Please do not apply patch if you will be having a Nuclear Stress Test,  Echocardiogram, Cardiac CT, MRI, or Chest Xray during the period you would be wearing the  monitor. The patch cannot be worn during these tests. You cannot remove and re-apply the  ZIO XT patch monitor.  Your ZIO patch monitor will be mailed 3 day USPS to your address on file. It may take 3-5 days  to receive your monitor after you have been enrolled.  Once you have received your monitor, please review the enclosed instructions. Your monitor  has already been registered assigning a specific monitor serial # to you.  Billing and Patient Assistance Program Information  We have supplied [employer] with any of your insurance information on file for billing purposes. [employer] offers a sliding scale Patient Assistance Program for patients that do not have  insurance, or whose insurance does not completely cover the cost of the ZIO monitor.  You must apply for the Patient Assistance Program to qualify for this discounted rate.  To apply, please call [employer] at [phone], select option 4, select option 2, ask to apply for  Patient Assistance Program. Sanna Crystal will ask your household income, and how many people  are in your household. They will quote your out-of-pocket cost based on that information.  [employer] will also be able to set up a 24-month, interest-free payment plan if needed.  Applying the monitor   Shave hair from upper left chest.  Hold abrader disc by orange tab. Rub abrader in 40 strokes over the upper left chest as  indicated in  your monitor instructions.  Clean area with 4 enclosed alcohol pads. Let dry.  Apply patch as indicated in monitor instructions. Patch will be placed under collarbone on left  side of chest with arrow pointing upward.  Rub patch adhesive wings for 2 minutes. Remove white label marked "1". Remove the white  label marked "2". Rub patch adhesive wings  for 2 additional minutes.  While looking in a mirror, press and release button in center of patch. A small green light will  flash 3-4 times. This will be your only indicator that the monitor has been turned on.  Do not shower for the first 24 hours. You may shower after the first 24 hours.  Press the button if you feel a symptom. You will hear a small click. Record Date, Time and  Symptom in the Patient Logbook.  When you are ready to remove the patch, follow instructions on the last 2 pages of Patient  Logbook. Stick patch monitor onto the last page of Patient Logbook.  Place Patient Logbook in the blue and white box. Use locking tab on box and tape box closed  securely. The blue and white box has prepaid postage on it. Please place it in the mailbox as  soon as possible. Your physician should have your test results approximately 7 days after the  monitor has been mailed back to [facility].  Call [facility] Customer Care at [phone] if you have questions regarding  your ZIO XT patch monitor. Call them immediately if you see an orange light blinking on your  monitor.  If your monitor falls off in less than 4 days, contact our Monitor department at [phone].  If your monitor becomes loose or falls off after 4 days call [employer] at [phone] for  suggestions on securing your monitor    Follow-Up: At [facility], you and your health needs are our priority.  As part of our continuing mission to provide you with exceptional heart care, we have created designated Provider Care Teams.  These Care Teams include your primary Cardiologist (physician) and Advanced Practice Providers (APPs -  Physician Assistants and Nurse Practitioners) who all work together to provide you with the care you need, when you need it.  We recommend signing up for the patient portal called "MyChart".  Sign up information is provided on this After Visit Summary.  MyChart is used to connect with  patients for Virtual Visits (Telemedicine).  Patients are able to view lab/test results, encounter notes, upcoming appointments, etc.  Non-urgent messages can be sent to your provider as well.   To learn more about what you can do with MyChart, go to [email or web address].    Your next appointment:   4 week(s)  The format for your next appointment:   In Person  Provider:   Katlyn West, NP      Then, Kardie Tobb, DO will plan to see you again in 6 month(s).   Other Instructions

## 2023-06-30 NOTE — Progress Notes (Unsigned)
 Enrolled for [employer] to mail a ZIO XT long term holter monitor to the patients address on file.

## 2023-07-01 ENCOUNTER — Ambulatory Visit: Payer: Self-pay | Admitting: Cardiovascular Disease

## 2023-07-01 LAB — BASIC METABOLIC PANEL WITH GFR
BUN/Creatinine Ratio: 36 — ABNORMAL HIGH (ref 12–28)
BUN: 31 mg/dL — ABNORMAL HIGH (ref 8–27)
CO2: 22 mmol/L (ref 20–29)
Calcium: 10.3 mg/dL (ref 8.7–10.3)
Chloride: 102 mmol/L (ref 96–106)
Creatinine, Ser: 0.87 mg/dL (ref 0.57–1.00)
Glucose: 96 mg/dL (ref 70–99)
Potassium: 5.2 mmol/L (ref 3.5–5.2)
Sodium: 141 mmol/L (ref 134–144)
eGFR: 68 mL/min/{1.73_m2} (ref >59)

## 2023-07-01 LAB — CBC
Hematocrit: 47.9 % — ABNORMAL HIGH (ref 34.0–46.6)
Hemoglobin: 15 g/dL (ref 11.1–15.9)
MCH: 30.2 pg (ref 26.6–33.0)
MCHC: 31.3 g/dL — ABNORMAL LOW (ref 31.5–35.7)
MCV: 97 fL (ref 79–97)
Platelets: 246 10*3/uL (ref 150–450)
RBC: 4.96 x10E6/uL (ref 3.77–5.28)
RDW: 12.6 % (ref 11.7–15.4)
WBC: 8.2 10*3/uL (ref 3.4–10.8)

## 2023-08-06 ENCOUNTER — Ambulatory Visit (HOSPITAL_COMMUNITY)
Admission: RE | Admit: 2023-08-06 | Discharge: 2023-08-06 | Disposition: A | Discharge status: Home / Self Care | Source: Ambulatory Visit | Attending: Cardiovascular Disease | Admitting: Cardiovascular Disease

## 2023-08-06 DIAGNOSIS — R55 Syncope and collapse: Secondary | ICD-10-CM | POA: Diagnosis present

## 2023-08-06 LAB — ECHOCARDIOGRAM COMPLETE: S' Lateral: 2.45 cm

## 2023-08-10 ENCOUNTER — Ambulatory Visit: Admitting: Cardiology

## 2023-09-06 DIAGNOSIS — R55 Syncope and collapse: Secondary | ICD-10-CM

## 2023-09-30 NOTE — Progress Notes (Unsigned)
 Cardiology Office Note    Date:  10/02/2023  ID:  Glenda Lyons, Glenda Lyons 03/01/1945, MRN [id number] PCP:  Antonio Meth, Jamee SAUNDERS, DO  Cardiologist:  Dub Huntsman, DO  Electrophysiologist:  None   Chief Complaint: Follow up for syncope  History of Present Illness: [name]    Glenda Lyons is a 78 y.o. female with visit-pertinent history of hypertension, hyperlipidemia and left bundle branch block.  Patient first evaluated by Dr. Huntsman on 02/14/2019 at the request of her PCP for an abnormal EKG.  She had recently had an EKG completed by her PCP that indicated a new left bundle branch block.  Blood pressure she was started on losartan  100 mg daily and hydrochlorothiazide  25 mg daily.  Her EKG with Dr. Huntsman showed no evidence of left bundle branch block.  At her visit her blood pressure had been better controlled.  It was recommended that she undergo stress test that showed normal myocardial perfusion without evidence of ischemia or infarction, patient developed a rate related left bundle branch block that resolved, felt to be a low risk study.  She was seen in clinic by Dr. Huntsman on 01/01/2022.  She had remained stable from a cardiac standpoint.  I saw patient in clinic on 03/05/2023, she reported that she had been doing well.  She had noted that 6 months prior she had been changing a light bulb, had presyncope and fell off of the stool, denied any frank syncope, she was later evaluated by her PCP.  At time of office visit she denied any further presyncope denied any syncope.  Cardiac monitor and echocardiogram were discussed however patient deferred at that time.  Patient was seen in clinic on 06/30/2023 by Dr. Court for an episode of syncope.  Patient reported that 3 weeks prior while at the grocery store she had an episode of syncope and another episode 3 days prior to office visit.  Patient reported she felt she had been dehydrated.  She denied any chest pain or shortness of breath.  Echocardiogram and  2-week Zio patch were ordered.  Echocardiogram on 08/06/2023 indicated mild concentric hypertrophy with moderate asymmetric septal hypertrophy and chordal SAM.  No significant LVOT gradient at rest, LVEF 60 to 65%, no RWMA, moderate asymmetric LVH of the septal segments, G1 DD, RV systolic function and size was normal, mild chordal SAM, mitral valve was normal in structure with no evidence of regurgitation or stenosis, aortic valve sclerosis was evident without evidence of stenosis.  Cardiac monitor indicated an average heart rate of 66 bpm, ranging from 43 to 128 bpm, predominant underlying rhythm was sinus rhythm.  Intermittent bundle branch block was present, ectopy was rare.  Today she presents for follow-up.  She reports that she has been doing well overall.  She denies any further episodes of syncope.  She notes that a few weeks ago when she strained her neck looking up to change a light bulb she felt slightly dizzy, resolved within a few seconds, did not have true syncope.  She denies any chest pain, shortness of breath, lower extremity edema, orthopnea or PND.  She denies any palpitations or feeling of irregular heartbeats.  She notes intermittent problems with dehydration, notes that she assists with caring for her husband.  ROS: .   Today she denies chest pain, shortness of breath, lower extremity edema, fatigue, palpitations, melena, hematuria, hemoptysis, diaphoresis, weakness, presyncope, syncope, orthopnea, and PND.  All other systems are reviewed and otherwise negative. Studies Reviewed: .  EKG:  EKG is not ordered today.  CV Studies: Cardiac studies reviewed are outlined and summarized above. Otherwise please see EMR for full report. Cardiac Studies & Procedures   ______________________________________________________________________________________________   STRESS TESTS  MYOCARDIAL PERFUSION IMAGING 02/24/2019  Interpretation Summary  The left ventricular ejection fraction is  hyperdynamic (>65%).  Nuclear stress EF: 73%.  There was no ST segment deviation noted during stress.  No T wave inversion was noted during stress.  The study is normal.  This is a low risk study.  1. Normal myocardial perfusion imaging study without evidence of ischemia or infarction. 2. Patient developed a rate-related LBBB that resolved (developed ~90 bpm). 3. Normal LVEF, >65%. 4. Low-risk study.  Darryle T. Barbaraann, MD [facility] [address] [place], [place] [address] [phone] 2:46 PM   ECHOCARDIOGRAM  ECHOCARDIOGRAM COMPLETE 08/06/2023  Narrative ECHOCARDIOGRAM REPORT    Patient Name:   LOUCILLE Lyons Date of Exam: 08/06/2023 Medical Rec #:  [id number]       Height:       64.5 in Accession #:    [id number]      Weight:       141.0 lb Date of Birth:  [birth year]      BSA:          1.695 m Patient Age:    77 years        BP:           168/84 mmHg Patient Gender: F               HR:           63 bpm. Exam Location:  [address]  Procedure: 2D Echo, 3D Echo and Strain Analysis (Both Spectral and Color Flow Doppler were utilized during procedure).  Indications:    R55 Syncope  History:        Patient has prior history of Echocardiogram examinations, most recent 02/01/2019. Arrythmias:LBBB; Risk Factors:Hypertension and Dyslipidemia. Palpitations.  Sonographer:    Jon Hacker RCS Referring Phys: [id number] JONATHAN J BERRY  IMPRESSIONS   1. There is mild concentric hypertorphy with moderate asymmetric septal hypertrophy and chordal SAM. No significant LVOT gradient at rest. Left ventricular ejection fraction, by estimation, is 60 to 65%. Left ventricular ejection fraction by 3D volume is 63 %. The left ventricle has normal function. The left ventricle has no regional wall motion abnormalities. There is moderate asymmetric left ventricular hypertrophy of the septal segment. Left ventricular diastolic parameters are consistent  with Grade I diastolic dysfunction (impaired relaxation). The average left ventricular global longitudinal strain is -22.0 %. The global longitudinal strain is normal. 2. Right ventricular systolic function is normal. The right ventricular size is normal. 3. Left atrial size was mildly dilated. 4. Mild chordal SAM. The mitral valve is normal in structure. No evidence of mitral valve regurgitation. No evidence of mitral stenosis. 5. The aortic valve is tricuspid. There is mild calcification of the aortic valve. Aortic valve regurgitation is not visualized. Aortic valve sclerosis/calcification is present, without any evidence of aortic stenosis. 6. The inferior vena cava is normal in size with greater than 50% respiratory variability, suggesting right atrial pressure of 3 mmHg.  FINDINGS Left Ventricle: There is mild concentric hypertorphy with moderate asymmetric septal hypertrophy and chordal SAM. No significant LVOT gradient at rest. Left ventricular ejection fraction, by estimation, is 60 to 65%. Left ventricular ejection fraction by 3D volume is 63 %. The left ventricle has normal  function. The left ventricle has no regional wall motion abnormalities. The average left ventricular global longitudinal strain is -22.0 %. Strain was performed and the global longitudinal strain is normal. The left ventricular internal cavity size was normal in size. There is moderate asymmetric left ventricular hypertrophy of the septal segment. Left ventricular diastolic parameters are consistent with Grade I diastolic dysfunction (impaired relaxation).  Right Ventricle: The right ventricular size is normal. No increase in right ventricular wall thickness. Right ventricular systolic function is normal.  Left Atrium: Left atrial size was mildly dilated.  Right Atrium: Right atrial size was normal in size.  Pericardium: There is no evidence of pericardial effusion.  Mitral Valve: Mild chordal SAM. The mitral valve  is normal in structure. No evidence of mitral valve regurgitation. No evidence of mitral valve stenosis.  Tricuspid Valve: The tricuspid valve is normal in structure. Tricuspid valve regurgitation is mild . No evidence of tricuspid stenosis.  Aortic Valve: The aortic valve is tricuspid. There is mild calcification of the aortic valve. Aortic valve regurgitation is not visualized. Aortic valve sclerosis/calcification is present, without any evidence of aortic stenosis.  Pulmonic Valve: The pulmonic valve was normal in structure. Pulmonic valve regurgitation is not visualized. No evidence of pulmonic stenosis.  Aorta: The aortic root is normal in size and structure.  Venous: The inferior vena cava is normal in size with greater than 50% respiratory variability, suggesting right atrial pressure of 3 mmHg.  IAS/Shunts: No atrial level shunt detected by color flow Doppler.  Additional Comments: 3D was performed not requiring image post processing on an independent workstation and was normal.   LEFT VENTRICLE PLAX 2D LVIDd:         3.77 cm         Diastology LVIDs:         2.45 cm         LV e' medial:    9.46 cm/s LV PW:         1.17 cm         LV E/e' medial:  4.4 LV IVS:        1.41 cm         LV e' lateral:   10.00 cm/s LVOT diam:     1.90 cm         LV E/e' lateral: 4.2 LV SV:         65 LV SV Index:   39              2D Longitudinal LVOT Area:     2.84 cm        Strain 2D Strain GLS   -24.9 % (A4C): 2D Strain GLS   -21.5 % (A3C): 2D Strain GLS   -19.6 % (A2C): 2D Strain GLS   -22.0 % Avg:  3D Volume EF LV 3D EF:    Left ventricul ar ejection fraction by 3D volume is 63 %.  3D Volume EF: 3D EF:        63 % LV EDV:       125 ml LV ESV:       46 ml LV SV:        78 ml  RIGHT VENTRICLE RV Basal diam:  3.04 cm RV S prime:     10.90 cm/s TAPSE (M-mode): 1.5 cm  LEFT ATRIUM           Index        RIGHT ATRIUM  Index LA diam:      3.10 cm 1.83 cm/m   RA  Area:     11.60 cm LA Vol (A2C): 28.7 ml 16.93 ml/m  RA Volume:   28.10 ml  16.58 ml/m LA Vol (A4C): 19.4 ml 11.44 ml/m AORTIC VALVE LVOT Vmax:   116.00 cm/s LVOT Vmean:  81.200 cm/s LVOT VTI:    0.231 m  AORTA Ao Root diam: 3.10 cm Ao Asc diam:  3.50 cm  MV E velocity: 41.90 cm/s MV A velocity: 60.80 cm/s  SHUNTS MV E/A ratio:  0.69        Systemic VTI:  0.23 m Systemic Diam: 1.90 cm  Toribio Fuel MD Electronically signed by Toribio Fuel MD Signature Date/Time: 08/06/2023/5:57:59 PM    Final    MONITORS  LONG TERM MONITOR (3-14 DAYS) 09/03/2023  Narrative Patch Wear Time:  13 days and 21 hours (2025-06-15T23:56:32-0400 to 2025-06-29T21:50:19-0400)  Patient had a min HR of 43 bpm, max HR of 128 bpm, and avg HR of 66 bpm. Predominant underlying rhythm was Sinus Rhythm. First Degree AV Block was present. Intermittent Bundle Branch Block was present. Isolated SVEs were rare (<1.0%), SVE Couplets were rare (<1.0%), and no SVE Triplets were present. Isolated VEs were rare (<1.0%), and no VE Couplets or VE Triplets were present. Ventricular Bigeminy and Trigeminy were present.  SR/SB/ST Occasional PVCs       ______________________________________________________________________________________________       Current Reported Medications:.    Current Meds  Medication Sig   aspirin 81 MG tablet Take 81 mg by mouth daily.   Calcium Carb-Cholecalciferol (CALCIUM-VITAMIN D) 500-400 MG-UNIT TABS Take by mouth 2 (two) times daily.   fluticasone  (FLONASE ) 50 MCG/ACT nasal spray instill 2 sprays into each nostril at bedtime   hydrochlorothiazide  (MICROZIDE ) 12.5 MG capsule Take 1 capsule (12.5 mg total) by mouth daily.   losartan  (COZAAR ) 100 MG tablet Take 1 tablet (100 mg total) by mouth daily.   meloxicam  (MOBIC ) 15 MG tablet Take 0.5-1 tablets (7.5-15 mg total) by mouth daily. (NEEDS APPOINTMENT BEFORE MORE REFILLS)   metoprolol  succinate (TOPROL  XL) 25 MG 24 hr  tablet Take 0.5 tablets (12.5 mg total) by mouth daily.   sertraline  (ZOLOFT ) 100 MG tablet Take 1&1/2 tablets (150 mg total) by mouth daily.   simvastatin  (ZOCOR ) 40 MG tablet Take 1 tablet (40 mg total) by mouth at bedtime.   VITAMIN A PO Take 3,000 mg by mouth daily.   vitamin C (ASCORBIC ACID) 250 MG tablet Take 250 mg by mouth daily.   [DISCONTINUED] losartan -hydrochlorothiazide  (HYZAAR) 100-25 MG tablet Take 1 tablet by mouth daily.    Physical Exam:    VS:  BP 136/78   Pulse 73   Ht 5' 4.5 (1.638 m)   Wt 142 lb (64.4 kg)   SpO2 97%   BMI 24.00 kg/m    Wt Readings from Last 3 Encounters:  10/01/23 142 lb (64.4 kg)  06/30/23 141 lb (64 kg)  03/05/23 148 lb 3.2 oz (67.2 kg)    GEN: Well nourished, well developed in no acute distress NECK: No JVD; No carotid bruits CARDIAC: RRR, no murmurs, rubs, gallops RESPIRATORY:  Clear to auscultation without rales, wheezing or rhonchi  ABDOMEN: Soft, non-tender, non-distended EXTREMITIES:  No edema; No acute deformity     Asessement and Plan:[name]    Syncope/LVH: Patient with 2 episodes of syncope in 06/2023, evaluate by Dr. Court, questioned if related to dehydration.  Cardiac monitor reassuring as noted above, echocardiogram  on 08/06/2023 indicated mild concentric hypertrophy with moderate asymmetric septal hypertrophy and chordal SAM.  No significant LVOT gradient at rest, LVEF 60 to 65%, no RWMA, moderate asymmetric LVH of the septal segments, G1 DD, RV systolic function and size was normal, mild chordal SAM, mitral valve was normal in structure with no evidence of regurgitation or stenosis, aortic valve sclerosis was evident without evidence of stenosis. Today she reports that she is doing well, denies any further episodes of syncope.  She notes that when she was turning her neck to look upward a few weeks ago she had increased dizziness, denied any true syncope.  She is aware of DMV guidelines regarding driving.  Given moderate asymmetric LVH  of the septal segment, mild chordal SAM will start on metoprolol  succinate 12.5 mg nightly.  Patient notes problems with dehydration, reduce her hydrochlorothiazide  to 12.5 mg daily.  Encouraged patient to monitor her blood pressure at home. Check carotid duplex. Reviewed ED precautions.  HTN: Patient's initial blood pressure elevated at 170/68, she notes that she was extremely stressed on regular appointment as there was an accident and she thought she was going to be late.  On recheck was 136/78.  Patient reports that she does not regularly check her blood pressure at home.  Given given moderate asymmetric LVH of the septal segments, mild chordal SAM will start on metoprolol  succinate 12.5 mg nightly.  Patient notes significant problems with dehydration, will reduce her hydrochlorothiazide  to 12.5 mg daily, ideally discontinuing in the future.  Continue losartan  100 mg daily.  Encouraged patient to monitor her blood pressure at home and bring a log to follow-up.  Hyperlipidemia: Patient reports that she is due for her annual physical, she will have a recheck of the lab work at that time including fasting lipids.   Disposition: F/u with Calven Gilkes, NP in 4-6 weeks.   Signed, Brennden Masten D Angelissa Supan, NP

## 2023-10-01 ENCOUNTER — Encounter: Payer: Self-pay | Admitting: Cardiology

## 2023-10-01 ENCOUNTER — Ambulatory Visit: Attending: Cardiology | Admitting: Cardiology

## 2023-10-01 VITALS — BP 136/78 | HR 73 | Ht 64.5 in | Wt 142.0 lb

## 2023-10-01 DIAGNOSIS — I10 Essential (primary) hypertension: Secondary | ICD-10-CM | POA: Diagnosis not present

## 2023-10-01 DIAGNOSIS — R55 Syncope and collapse: Secondary | ICD-10-CM

## 2023-10-01 DIAGNOSIS — E782 Mixed hyperlipidemia: Secondary | ICD-10-CM | POA: Diagnosis not present

## 2023-10-01 MED ORDER — METOPROLOL SUCCINATE ER 25 MG PO TB24: 12.5000 mg | ORAL_TABLET | Freq: Every day | ORAL | 3 refills | Status: AC

## 2023-10-01 MED ORDER — HYDROCHLOROTHIAZIDE 12.5 MG PO CAPS
12.5000 mg | ORAL_CAPSULE | Freq: Every day | ORAL | 3 refills | Status: AC
Start: 2023-10-01 — End: 2023-12-30

## 2023-10-01 MED ORDER — LOSARTAN POTASSIUM 100 MG PO TABS: 100.0000 mg | ORAL_TABLET | Freq: Every day | ORAL | 3 refills | Status: AC

## 2023-10-01 NOTE — Patient Instructions (Signed)
 Medication Instructions:  Start Metoprolol  succinate Er 12.5 mg once a day (at night) Start Losartan  100 mg once a day Start hydrochlorothiazide  12.5 mg once a day   *If you need a refill on your cardiac medications before your next appointment, please call your pharmacy*  Lab Work: No labs  Testing/Procedures: Your physician has requested that you have a carotid duplex. This test is an ultrasound of the carotid arteries in your neck. It looks at blood flow through these arteries that supply the brain with blood. Allow one hour for this exam. There are no restrictions or special instructions. This will take place at [address] 4th floor  Please note: We ask at that you not bring children with you during ultrasound (echo/ vascular) testing. Due to room size and safety concerns, children are not allowed in the ultrasound rooms during exams. Our front office staff cannot provide observation of children in our lobby area while testing is being conducted. An adult accompanying a patient to their appointment will only be allowed in the ultrasound room at the discretion of the ultrasound technician under special circumstances. We apologize for any inconvenience.  Follow-Up: At [facility], you and your health needs are our priority.  As part of our continuing mission to provide you with exceptional heart care, our providers are all part of one team.  This team includes your primary Cardiologist (physician) and Advanced Practice Providers or APPs (Physician Assistants and Nurse Practitioners) who all work together to provide you with the care you need, when you need it.  Your next appointment:   6-8 week(s)  Provider:   Katlyn West, NP      Then, Kardie Tobb, DO will plan to see you again in 4 month(s).

## 2023-10-02 ENCOUNTER — Encounter: Payer: Self-pay | Admitting: Cardiology

## 2023-10-14 ENCOUNTER — Ambulatory Visit: Payer: Self-pay | Admitting: Cardiology

## 2023-10-14 ENCOUNTER — Ambulatory Visit (HOSPITAL_COMMUNITY)
Admission: RE | Admit: 2023-10-14 | Discharge: 2023-10-14 | Disposition: A | Discharge status: Home / Self Care | Source: Ambulatory Visit | Attending: Cardiology | Admitting: Cardiology

## 2023-10-14 DIAGNOSIS — R55 Syncope and collapse: Secondary | ICD-10-CM | POA: Insufficient documentation

## 2023-10-14 DIAGNOSIS — E782 Mixed hyperlipidemia: Secondary | ICD-10-CM | POA: Diagnosis present

## 2023-10-14 DIAGNOSIS — I10 Essential (primary) hypertension: Secondary | ICD-10-CM | POA: Diagnosis present

## 2023-10-31 ENCOUNTER — Other Ambulatory Visit: Payer: Self-pay | Admitting: Family Medicine

## 2023-10-31 DIAGNOSIS — F320 Major depressive disorder, single episode, mild: Secondary | ICD-10-CM

## 2023-11-02 ENCOUNTER — Ambulatory Visit: Admitting: Orthopedic Surgery

## 2023-11-12 ENCOUNTER — Ambulatory Visit: Admitting: Cardiology

## 2023-12-11 ENCOUNTER — Telehealth: Payer: Self-pay

## 2023-12-11 NOTE — Telephone Encounter (Signed)
 Copied from CRM [id number]. Topic: Clinical - Medical Advice >> Dec 11, 2023 10:50 AM Anairis L wrote: Reason for CRM: Patient is calling in to inform provider she has been exposed to c-diff bowel infection.

## 2023-12-14 ENCOUNTER — Encounter: Payer: Self-pay | Admitting: Radiology

## 2023-12-15 NOTE — Telephone Encounter (Signed)
 Spoke w/ Pt- declines diarrhea. She will come in for visit if she develops diarrhea.

## 2023-12-16 ENCOUNTER — Ambulatory Visit: Admitting: Cardiology

## 2024-01-01 ENCOUNTER — Ambulatory Visit: Admitting: Cardiology

## 2024-01-11 ENCOUNTER — Ambulatory Visit: Payer: Self-pay

## 2024-01-11 NOTE — Telephone Encounter (Signed)
 FYI Only or Action Required?: Action required by provider: update on patient condition and appt 12/3.  Patient was last seen in primary care on 09/30/2022 by Antonio Meth, Jamee SAUNDERS, DO.  Called Nurse Triage reporting Fever.  Symptoms began several days ago.  Interventions attempted: OTC medications: Tylenol .  Symptoms are: unchanged.  Triage Disposition: See Physician Within 24 Hours  Patient/caregiver understands and will follow disposition?: Yes  Message from [facility] sent at 01/11/2024  3:28 PM EST  Reason for Triage: over thanksgiving, having aches in joints and mucles and taking tylenol . taking temp. and was normal.  sat- taking tylenol  2-3 times a day. didnt take tylenol  for 24 hours. sunday evening. took temp. 102.8 - took tylenol  and went down to 98. when stopping to take tylenol  and goes back up.   Reason for Disposition  Fever present > 3 days (72 hours)  Answer Assessment - Initial Assessment Questions Body and joint aches on Thurs into Friday. Taking Tylenol  2-3times a day for the aches. Sunday decided no Tylenol  to see what her body would do, and spike fever 102.8.SABRAtook tylenol  98.9 2hrs later. Has taken tylenol  q6hrs since then and if she is late by an hour then it creeps up quickly.  Thirsty but no real appetite. Husband has Cdiff- in and out of inpatient over last 3 months- patient is exhausted and stress. Denies diarrhea, CP, SOB, abdominal pain, neurological sx, or signs of infection. She got dizzy standing up to quick yesterday. Getting up slowly. Appt with PCP office 12/3 to assess for alternate cause. ED/UC precautions understood.  1. TEMPERATURE: What is the most recent temperature?  How was it measured?      Tmax 102.8 2. ONSET: When did the fever start?      Friday she thinks 3. CHILLS: Do you have chills? If yes: How bad are they?  (e.g., none, mild, moderate, severe)     Thursday, Friday and Saturday at night  4. OTHER SYMPTOMS: Do you have any  other symptoms besides the fever?  (e.g., abdomen pain, cough, diarrhea, earache, headache, sore throat, urination pain)     Body and muscle aches  5. CAUSE: If there are no symptoms, ask: What do you think is causing the fever?      Stress, worn out  6. CONTACTS: Does anyone else in the family have an infection?     Husband with Cdiff 1 month ago- was inpatient and home for 3 weeks now. Looking after him, wearing gloves 7. TREATMENT: What have you done so far to treat this fever? (e.g., OTC fever medicines)     Tylenol - brings down  8. IMMUNOCOMPROMISE: Do you have any of the following: diabetes, HIV positive, splenectomy, cancer chemotherapy, chronic steroid treatment, transplant patient, etc.?     denies 10. TRAVEL: Have you traveled out of the country in the last month? (e.g., travel history, exposures)       denies  Protocols used: [facility]

## 2024-01-11 NOTE — Telephone Encounter (Signed)
 Appt scheduled

## 2024-01-13 ENCOUNTER — Encounter: Payer: Self-pay | Admitting: Family Medicine

## 2024-01-13 ENCOUNTER — Ambulatory Visit: Admitting: Family Medicine

## 2024-01-13 VITALS — BP 128/74 | HR 100 | Temp 99.5°F | Resp 16 | Ht 64.0 in | Wt 142.0 lb

## 2024-01-13 DIAGNOSIS — R197 Diarrhea, unspecified: Secondary | ICD-10-CM

## 2024-01-13 DIAGNOSIS — R350 Frequency of micturition: Secondary | ICD-10-CM | POA: Diagnosis not present

## 2024-01-13 MED ORDER — VANCOMYCIN HCL 125 MG PO CAPS: 125.0000 mg | ORAL_CAPSULE | Freq: Four times a day (QID) | ORAL | 0 refills | Status: DC

## 2024-01-13 NOTE — Patient Instructions (Addendum)
 As long as you are vomiting or having diarrhea, please drink fluids with electrolytes like Gatorade, Powerade, or Pedialyte (if you can stomach the taste). Drink these along with water mainly.   Please let me know Friday if you are still having loose stools.   Stay hydrated.   Warning signs/symptoms: Uncontrollable nausea/vomiting, fevers, worsening symptoms despite treatment, confusion.  Give us  around 2 business days to get culture back to you.  OK to take Tylenol  1000 mg (2 extra strength tabs) or 975 mg (3 regular strength tabs) every 6 hours as needed.  Let us  know if you need anything.

## 2024-01-13 NOTE — Progress Notes (Signed)
 Chief Complaint  Patient presents with   Fever    Fever     Subjective Glenda Lyons is a 78 y.o. female who presents with nausea and diarrhea Symptoms began around 1 week ago.  Patient has diarrhea, occasional nausea, arthralgias, myalgias, urinary frequency a couple days ago, and fevers (102.8 F) Patient denies abdominal pain, vomiting, URI symptoms, and cough Treatment to date: Tylenol  Sick contacts: spouse dx'd w C diff  Past Medical History:  Diagnosis Date   Acute bacterial sinusitis 12/26/2014   Allergy    seasonal   Bilateral conjunctivitis 12/26/2014   Bursitis    BURSITIS, RIGHT HIP 07/10/2006   Qualifier: Diagnosis of  By: Antonio ROSALEA Rockers     Chronic midline low back pain without sciatica 05/02/2019   COLONIC POLYPS, HX OF 03/13/2010   Qualifier: Diagnosis of  By: Antonio ROSALEA Rockers     Depression    Depression with anxiety 04/13/2020   Depression, major, single episode, mild 07/10/2006   Qualifier: Diagnosis of  By: Antonio ROSALEA Rockers     Dysuria 06/14/2010   Esodeviation 12/19/2010   Essential hypertension 07/10/2006   Qualifier: Diagnosis of  By: Antonio ROSALEA Rockers     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   Herniated disc    HERNIATED DISC 07/10/2006   Qualifier: Diagnosis of  By: Antonio ROSALEA Rockers     Hyperlipemia    Hyperlipidemia 06/03/2007   Qualifier: Diagnosis of  By: Antonio ROSALEA Rockers     Hypertension    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   Neuromuscular disorder (HCC)    New onset left bundle branch block (LBBB) 01/25/2019   Osteoarthritis    OSTEOARTHRITIS 06/03/2007   Qualifier: Diagnosis of  By: Antonio ROSALEA Rockers     Palpitation 01/25/2019   POSTMENOPAUSAL STATUS 12/29/2006   Qualifier: Diagnosis of  By: Antonio ROSALEA Rockers     Sciatica    Seborrheic keratoses 07/08/2012   Skin laxity 08/20/2013   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   Status post lumbar spinal fusion 12/21/2014   Thyroid  eye disease  08/21/2011   UNSPECIFIED PERIPHERAL VASCULAR DISEASE 12/29/2006   Qualifier: Diagnosis of  By: Antonio ROSALEA Rockers      Exam BP 128/74 (BP Location: Left Arm, Patient Position: Sitting)   Pulse 100   Temp 99.5 F (37.5 C) (Oral)   Resp 16   Ht 5' 4 (1.626 m)   Wt 142 lb (64.4 kg)   SpO2 98%   BMI 24.37 kg/m  General:  well developed, well hydrated, in no apparent distress Skin:  warm, no pallor or diaphoresis, no rashes Throat/Pharynx: MMD, no mucosal lesions Lungs:  clear to auscultation, breath sounds equal bilaterally, no respiratory distress, no wheezes Cardio: Regular rhythm, tachycardic Abdomen:  abdomen soft, nontender; bowel sounds normal; no masses or organomegaly Psych: Appropriate judgement/insight  Assessment and Plan  Diarrhea of presumed infectious origin - Plan: vancomycin (VANCOCIN) 125 MG capsule  Urinary frequency - Plan: Urinalysis, Urine Culture  Treatment of febrile illness.  W close contact of spouse having C diff, will empirically tx her with 10 days of vancomycin.  She will send a message in a couple days if not having improvement in her loose stools and we will check a stool culture.  Check urine today.  Needs to push fluids, particular with electrolytes while she is having loose stools. The patient voiced understanding and agreement to the plan.  Mabel Mt Galt, DO 01/13/24  3:47 PM

## 2024-01-14 ENCOUNTER — Ambulatory Visit: Payer: Self-pay | Admitting: Family Medicine

## 2024-01-14 LAB — URINALYSIS, ROUTINE W REFLEX MICROSCOPIC
Bilirubin Urine: NEGATIVE
Ketones, ur: NEGATIVE
Nitrite: NEGATIVE
Specific Gravity, Urine: 1.01 (ref 1.000–1.030)
Total Protein, Urine: 100 — AB
Urine Glucose: NEGATIVE
Urobilinogen, UA: 0.2 (ref 0.0–1.0)
pH: 6 (ref 5.0–8.0)

## 2024-01-14 MED ORDER — CEPHALEXIN 500 MG PO CAPS: 500.0000 mg | ORAL_CAPSULE | Freq: Three times a day (TID) | ORAL | 0 refills | Status: DC

## 2024-01-15 ENCOUNTER — Other Ambulatory Visit: Payer: Self-pay | Admitting: *Deleted

## 2024-01-15 ENCOUNTER — Ambulatory Visit: Payer: Self-pay

## 2024-01-15 DIAGNOSIS — R197 Diarrhea, unspecified: Secondary | ICD-10-CM

## 2024-01-15 NOTE — Telephone Encounter (Signed)
 Is she able to get us  a stool sample before the weekend? This would need to be done quickly.

## 2024-01-15 NOTE — Telephone Encounter (Signed)
 Can you place a reminder to check in with her on Monday please?  Thank you.

## 2024-01-15 NOTE — Telephone Encounter (Signed)
 Called [facility] and advised Damien that this update has been sent for Dr Frann to review of patient's updated symptoms

## 2024-01-15 NOTE — Telephone Encounter (Signed)
 Spoke with patient and she was advised it was important that she comes to get the stool kit and complete before the weekend. Pt states that she feels much better since her visit with Dr. Frann. She does not want to leave her husband at the hospital due to nurses/ doctors stating that they going to call hospice to take over the husband care. Patient stated that she will try to come before 5 pm today,01/15/24 to get a stool kit but if she can not make it she will come on Monday, 01/18/24. Just an FYI.

## 2024-01-15 NOTE — Telephone Encounter (Signed)
 Patient was advised to contact Dr Frann back today if her symptoms had not improved. Patient states that her stools have not firmed up any Patient has been on vancomycin  (VANCOCIN ) 125 MG capsule [[id number]]    Loose stools--started a little on Wednesday but yesterday and today have been more frquent  Patient is at ICU with her husband--he went to the hospital last night He has been diagnosed with C diff prior and at home with patient Patient states maybe 4 or 5 bouts of diarrhea in the past 24 hours Patient states she has been eating and drinking and continuing the medication Patient denies fevers or abdominal pain when not having a bowel movement  Patient states that she doesn't want to leave her husband to go to an appointment today. None are available at PCP today. She is advised if she gets worse to go to the ER and she verbalized understanding but she just wanted to update her PCP at this time as he instructed.     FYI Only or Action Required?: Action required by provider: update on patient condition.  Patient was last seen in primary care on 01/13/2024 by Frann Mabel Mt, DO.  Called Nurse Triage reporting Diarrhea.  Symptoms began over a week ago.  Interventions attempted: Prescription medications: Vancomycin , Rest, hydration, or home remedies, and Other: hydration.  Symptoms are: gradually worsening.  Triage Disposition: See Physician Within 24 Hours  Patient/caregiver understands and will follow disposition?:           Message from Ahlexyia S sent at 01/15/2024  1:25 PM EST  Reason for Triage: Pt called in stating she is still having uncontrolled diarrhea. Pt was told at her OV Wednesday to callback in if the symptoms haven't cleared up by Friday or has worsened. When contacting pt attempt to reach pt at [phone], pt stated this is the number that needs to be used when contacting her.   Reason for Disposition  [1] MODERATE diarrhea (e.g., 4-6  times / day more than normal) AND [2] age > 70 years  Answer Assessment - Initial Assessment Questions Patient was advised to contact Dr Frann back today if her symptoms had not improved. Patient states that her stools have not firmed up any Patient has been on vancomycin  (VANCOCIN ) 125 MG capsule [[id number]]    Loose stools--started a little on Wednesday but yesterday and today have been more frquent  Patient is at ICU with her husband--he went to the hospital last night He has been diagnosed with C diff prior and at home with patient Patient states maybe 4 or 5 bouts of diarrhea in the past 24 hours Patient states she has been eating and drinking and continuing the medication Patient denies fevers or abdominal pain when not having a bowel movement  Patient states that she doesn't want to leave her husband to go to an appointment today. None are available at PCP today. She is advised if she gets worse to go to the ER and she verbalized understanding but she just wanted to update her PCP at this time as he instructed.  Protocols used: [facility]

## 2024-01-16 LAB — URINE CULTURE
MICRO NUMBER:: 17307399
SPECIMEN QUALITY:: ADEQUATE

## 2024-01-16 MED ORDER — CEFDINIR 300 MG PO CAPS: 300.0000 mg | ORAL_CAPSULE | Freq: Two times a day (BID) | ORAL | 0 refills | Status: DC

## 2024-01-18 ENCOUNTER — Other Ambulatory Visit

## 2024-01-18 DIAGNOSIS — R197 Diarrhea, unspecified: Secondary | ICD-10-CM

## 2024-01-18 NOTE — Telephone Encounter (Signed)
 I called and checked up on the patient, she stated that she is doing well. She did not take her temp over the weekend but feels that she does not have a fever. She did state to have some diarrhea over the weekend and that she did bring a stool sample to the lab on today.

## 2024-01-21 ENCOUNTER — Ambulatory Visit: Payer: Self-pay | Admitting: Family Medicine

## 2024-01-21 MED ORDER — CEFDINIR 300 MG PO CAPS: 300.0000 mg | ORAL_CAPSULE | Freq: Two times a day (BID) | ORAL | 0 refills | Status: AC

## 2024-01-21 NOTE — Telephone Encounter (Signed)
 Spoke w/ Pt- informed of results and recommendations. Pt verbalized understanding. I've resent the Cefdinir  to her pharmacy to make sure they did receive it.

## 2024-01-21 NOTE — Telephone Encounter (Signed)
 Copied from CRM #[id number]. Topic: Clinical - Medication Question >> Jan 21, 2024  8:15 AM Deaijah H wrote: Reason for CRM: Patient called in stating she is unsure of a medication that dr. frann has sent in to pharmacy Cefolexin? Only knew about one. Please give a call to discuss.

## 2024-01-22 LAB — STOOL CULTURE: E coli, Shiga toxin Assay: NEGATIVE

## 2024-02-01 ENCOUNTER — Ambulatory Visit: Admitting: Cardiology

## 2024-02-05 ENCOUNTER — Ambulatory Visit: Payer: Self-pay

## 2024-02-05 NOTE — Telephone Encounter (Signed)
 I tried to call the patient to establish a virtual call since she would be unable to come into the office for a visit. Patient stated that she does not know how to text and that getting into her MyChart has been complicated. I tried to ask her if she wanted medications but she kept explaining that her husband is on hospice care and she doesn't have the time to talk. Call ended.

## 2024-02-05 NOTE — Telephone Encounter (Signed)
 FYI Only or Action Required?: FYI only for provider: Appt advised, but patient unable to come in for office visit due to husband being at home on hospice care-she is unable to leave him unattended. Patient requesting recommendations from PCP.   Patient was last seen in primary care on 01/13/2024 by Frann Mabel Mt, DO.  Called Nurse Triage reporting Diarrhea.  Symptoms began several days ago.  Interventions attempted: OTC medications: Pepto Bismol and Rest, hydration, or home remedies.  Symptoms are: unchanged.  Triage Disposition: See HCP Within 4 Hours (Or PCP Triage)  Patient/caregiver understands and will follow disposition?: No, wishes to speak with PCP  Reason for Disposition  [1] SEVERE diarrhea (e.g., 7 or more times / day more than normal) AND [2] age > 60 years  Answer Assessment - Initial Assessment Questions Patient has been experiencing diarrhea for the last week. She was with her husband at the hospital recently including Monday when she reports fainting from low blood pressure. She is now at home and diarrhea has still continued. She reports feeling lightheaded at times and thirsty, but denies any changes in urination. She states that she has increased her fluid intake since Monday and feels more hydrated since then. Patient has been taking pepto bismol which she states has helped some, but she still experiences diarrhea frequently. She did recently take antibiotics and was exposed to C-Diff. Office visit advised, but patient states that husband is now at home on hospice care and she is not able to leave him to be seen. Patient is requesting recommendations from her PCP.   1. DIARRHEA SEVERITY: How bad is the diarrhea? How many more stools have you had in the past 24 hours than normal?      Severe, patient states that she has been having diarrhea about 6x/day  2. ONSET: When did the diarrhea begin?      Sunday 12/21  3. STOOL DESCRIPTION:  How loose or watery  is the diarrhea? What is the stool color? Is there any blood or mucous in the stool?     Loose, denies blood in stool  4. VOMITING: Are you also vomiting? If Yes, ask: How many times in the past 24 hours?      No vomiting  5. ABDOMEN PAIN: Are you having any abdomen pain? If Yes, ask: What does it feel like? (e.g., crampy, dull, intermittent, constant)      Only when having a bowel movement  6. ABDOMEN PAIN SEVERITY: If present, ask: How bad is the pain?  (e.g., Scale 1-10; mild, moderate, or severe)     Moderate  7. ORAL INTAKE: If vomiting, Have you been able to drink liquids? How much liquids have you had in the past 24 hours?     Has been able to drink liquids and has increased intake  8. HYDRATION: Any signs of dehydration? (e.g., dry mouth [not just dry lips], too weak to stand, dizziness, new weight loss) When did you last urinate?     Occasionally lightheaded, thirsty, states that she has had normal urine output  9. EXPOSURE: Have you traveled to a foreign country recently? Have you been exposed to anyone with diarrhea? Could you have eaten any food that was spoiled?     Husband was recently hospitalized and had C-Diff  10. ANTIBIOTIC USE: Are you taking antibiotics now or have you taken antibiotics in the past 2 months?       Yes, states that she was prescribed a 7 day course  of prophylactic antibiotics when husband was diagnosed with C-Diff and later took Cefdinir , but states she finished the antibiotics prior to diarrhea starting  11. OTHER SYMPTOMS: Do you have any other symptoms? (e.g., fever, blood in stool)       Denies any other symptoms  12. PREGNANCY: Is there any chance you are pregnant? When was your last menstrual period?       NA  Protocols used: [facility]

## 2024-02-05 NOTE — Telephone Encounter (Addendum)
 Message from Kevelyn M sent at 02/05/2024 10:04 AM EST  Reason for Triage: Started having diarrhea on Sunday and Monday. Tuesday beginning taking pepto. She is still having diarrhea. Patient was taking an antibiotic cefdinir  (OMNICEF ) 300 MG capsule after 01/21/2024.   Call back:  [phone]   Patient asked if she can call back later. Reports her husband's hospice nurse is at their home and she is unable to answer questions at this time. Will place in call backs.
